# Patient Record
Sex: Female | Born: 1990 | Race: White | Hispanic: No | Marital: Single | Smoking: Current every day smoker
Health system: Southern US, Community
[De-identification: ages and names within clinical notes are randomized; demographics above are authoritative.]

## PROBLEM LIST (undated history)

## (undated) DIAGNOSIS — N189 Chronic kidney disease, unspecified: Secondary | ICD-10-CM

## (undated) DIAGNOSIS — F1190 Opioid use, unspecified, uncomplicated: Secondary | ICD-10-CM

## (undated) DIAGNOSIS — F603 Borderline personality disorder: Secondary | ICD-10-CM

## (undated) DIAGNOSIS — E119 Type 2 diabetes mellitus without complications: Secondary | ICD-10-CM

## (undated) DIAGNOSIS — J45909 Unspecified asthma, uncomplicated: Secondary | ICD-10-CM

## (undated) HISTORY — PX: EYE SURGERY: SHX253

---
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
## 2011-04-22 DIAGNOSIS — J4520 Mild intermittent asthma, uncomplicated: Secondary | ICD-10-CM | POA: Insufficient documentation

## 2011-09-04 DIAGNOSIS — K561 Intussusception: Secondary | ICD-10-CM | POA: Insufficient documentation

## 2011-09-10 DIAGNOSIS — E1143 Type 2 diabetes mellitus with diabetic autonomic (poly)neuropathy: Secondary | ICD-10-CM | POA: Diagnosis present

## 2012-02-03 DIAGNOSIS — G4700 Insomnia, unspecified: Secondary | ICD-10-CM | POA: Insufficient documentation

## 2012-06-02 DIAGNOSIS — E1140 Type 2 diabetes mellitus with diabetic neuropathy, unspecified: Secondary | ICD-10-CM | POA: Diagnosis present

## 2012-08-16 DIAGNOSIS — S2239XA Fracture of one rib, unspecified side, initial encounter for closed fracture: Secondary | ICD-10-CM | POA: Insufficient documentation

## 2012-09-14 DIAGNOSIS — M4120 Other idiopathic scoliosis, site unspecified: Secondary | ICD-10-CM | POA: Insufficient documentation

## 2013-08-24 DIAGNOSIS — F1199 Opioid use, unspecified with unspecified opioid-induced disorder: Secondary | ICD-10-CM | POA: Diagnosis present

## 2016-08-05 DIAGNOSIS — R011 Cardiac murmur, unspecified: Secondary | ICD-10-CM | POA: Insufficient documentation

## 2016-10-28 DIAGNOSIS — Q5121 Other complete doubling of uterus: Secondary | ICD-10-CM | POA: Insufficient documentation

## 2016-10-28 DIAGNOSIS — R3129 Other microscopic hematuria: Secondary | ICD-10-CM | POA: Insufficient documentation

## 2017-04-05 DIAGNOSIS — F1210 Cannabis abuse, uncomplicated: Secondary | ICD-10-CM | POA: Insufficient documentation

## 2018-03-01 DIAGNOSIS — N182 Chronic kidney disease, stage 2 (mild): Secondary | ICD-10-CM | POA: Insufficient documentation

## 2018-03-01 DIAGNOSIS — N189 Chronic kidney disease, unspecified: Secondary | ICD-10-CM | POA: Insufficient documentation

## 2018-03-11 DIAGNOSIS — F909 Attention-deficit hyperactivity disorder, unspecified type: Secondary | ICD-10-CM | POA: Insufficient documentation

## 2018-09-12 DIAGNOSIS — I10 Essential (primary) hypertension: Secondary | ICD-10-CM | POA: Diagnosis present

## 2019-02-19 ENCOUNTER — Other Ambulatory Visit: Payer: Self-pay

## 2019-02-19 ENCOUNTER — Emergency Department
Admission: EM | Admit: 2019-02-19 | Discharge: 2019-02-19 | Disposition: A | Discharge status: Home / Self Care | Payer: Self-pay | Attending: Emergency Medicine | Admitting: Emergency Medicine

## 2019-02-19 DIAGNOSIS — R1110 Vomiting, unspecified: Secondary | ICD-10-CM | POA: Insufficient documentation

## 2019-02-19 DIAGNOSIS — Z5321 Procedure and treatment not carried out due to patient leaving prior to being seen by health care provider: Secondary | ICD-10-CM | POA: Insufficient documentation

## 2019-02-19 HISTORY — DX: Unspecified asthma, uncomplicated: J45.909

## 2019-02-19 HISTORY — DX: Type 2 diabetes mellitus without complications: E11.9

## 2019-02-19 LAB — CBC
HCT: 35.3 % — ABNORMAL LOW (ref 36.0–46.0)
Hemoglobin: 12.3 g/dL (ref 12.0–15.0)
MCH: 28.9 pg (ref 26.0–34.0)
MCHC: 34.8 g/dL (ref 30.0–36.0)
MCV: 82.9 fL (ref 80.0–100.0)
Platelets: 249 10*3/uL (ref 150–400)
RBC: 4.26 MIL/uL (ref 3.87–5.11)
RDW: 11.4 % — ABNORMAL LOW (ref 11.5–15.5)
WBC: 8.6 10*3/uL (ref 4.0–10.5)
nRBC: 0 % (ref 0.0–0.2)

## 2019-02-19 LAB — COMPREHENSIVE METABOLIC PANEL
ALT: 24 U/L (ref 0–44)
AST: 29 U/L (ref 15–41)
Albumin: 3.8 g/dL (ref 3.5–5.0)
Alkaline Phosphatase: 143 U/L — ABNORMAL HIGH (ref 38–126)
Anion gap: 9 (ref 5–15)
BUN: 20 mg/dL (ref 6–20)
CO2: 24 mmol/L (ref 22–32)
Calcium: 9.3 mg/dL (ref 8.9–10.3)
Chloride: 105 mmol/L (ref 98–111)
Creatinine, Ser: 1.11 mg/dL — ABNORMAL HIGH (ref 0.44–1.00)
GFR calc Af Amer: >60 mL/min (ref >60)
GFR calc non Af Amer: >60 mL/min (ref >60)
Glucose, Bld: 219 mg/dL — ABNORMAL HIGH (ref 70–99)
Potassium: 4.7 mmol/L (ref 3.5–5.1)
Sodium: 138 mmol/L (ref 135–145)
Total Bilirubin: 0.6 mg/dL (ref 0.3–1.2)
Total Protein: 7.3 g/dL (ref 6.5–8.1)

## 2019-02-19 LAB — LIPASE, BLOOD: Lipase: 32 U/L (ref 11–51)

## 2019-02-19 MED ORDER — FENTANYL CITRATE (PF) 100 MCG/2ML IJ SOLN
50.0000 ug | INTRAMUSCULAR | Status: AC | PRN
  Administered 2019-02-19 (×2): 50 ug via INTRAVENOUS
  Filled 2019-02-19: qty 2

## 2019-02-19 MED ORDER — ONDANSETRON HCL 4 MG/2ML IJ SOLN
4.0000 mg | Freq: Once | INTRAMUSCULAR | Status: AC
  Administered 2019-02-19: 13:00:00 4 mg via INTRAVENOUS
  Filled 2019-02-19: qty 2

## 2019-02-19 MED ORDER — FENTANYL CITRATE (PF) 100 MCG/2ML IJ SOLN
INTRAMUSCULAR | Status: AC
  Administered 2019-02-19: 14:00:00 50 ug via INTRAVENOUS
  Filled 2019-02-19: qty 2

## 2019-02-19 NOTE — ED Triage Notes (Signed)
Pt states woke up at 10am with generalized abd pain and vomiting. Pt won't stay still. Has type 1 DM. A&O, in wheelchair.

## 2019-02-19 NOTE — ED Notes (Signed)
Pt pacing up and down hallway. Informed to go back to fam wait. Exchanged used emesis bag for new one.

## 2019-02-20 ENCOUNTER — Telehealth: Payer: Self-pay | Admitting: Emergency Medicine

## 2019-02-20 NOTE — Telephone Encounter (Signed)
Called patient due to lwot to inquire about condition and follow up plans. Left message.   

## 2019-05-03 ENCOUNTER — Encounter: Payer: Self-pay | Admitting: Emergency Medicine

## 2019-05-03 ENCOUNTER — Other Ambulatory Visit: Payer: Self-pay

## 2019-05-03 ENCOUNTER — Emergency Department
Admission: EM | Admit: 2019-05-03 | Discharge: 2019-05-03 | Disposition: A | Discharge status: Home / Self Care | Payer: Medicaid Other | Attending: Emergency Medicine | Admitting: Emergency Medicine

## 2019-05-03 DIAGNOSIS — R112 Nausea with vomiting, unspecified: Secondary | ICD-10-CM | POA: Insufficient documentation

## 2019-05-03 DIAGNOSIS — M549 Dorsalgia, unspecified: Secondary | ICD-10-CM | POA: Insufficient documentation

## 2019-05-03 DIAGNOSIS — Z5321 Procedure and treatment not carried out due to patient leaving prior to being seen by health care provider: Secondary | ICD-10-CM | POA: Insufficient documentation

## 2019-05-03 DIAGNOSIS — R109 Unspecified abdominal pain: Secondary | ICD-10-CM | POA: Insufficient documentation

## 2019-05-03 NOTE — ED Notes (Signed)
Pt reported to triage RN that she was going to [facility]

## 2019-05-03 NOTE — ED Notes (Addendum)
Patient reports she has zofran and phenergan at home and she is still nauseas. This RN had two unsuccessful attempts at blood draw. When this RN went to check patients blood sugar she stated "I need to leave and go to [facility]. I normally go there and they know what to do for me." Patient stood up out of wheelchair and walked out to front of ER. Patient reports smoking marijuana last night.

## 2019-05-03 NOTE — ED Triage Notes (Signed)
Pt reports sharp pain to her left flank and back since last pm and some nausea and vomiting. Pt states has kidney disease as well. Pt pale in triage.

## 2019-05-30 ENCOUNTER — Ambulatory Visit: Payer: Medicaid Other | Attending: Internal Medicine

## 2019-05-30 DIAGNOSIS — Z20822 Contact with and (suspected) exposure to covid-19: Secondary | ICD-10-CM

## 2019-05-31 LAB — NOVEL CORONAVIRUS, NAA: SARS-CoV-2, NAA: NOT DETECTED

## 2020-07-12 ENCOUNTER — Emergency Department (HOSPITAL_COMMUNITY): Payer: Self-pay

## 2020-07-12 ENCOUNTER — Inpatient Hospital Stay (HOSPITAL_COMMUNITY)
Admission: EM | Admit: 2020-07-12 | Discharge: 2020-07-16 | DRG: 381 | Disposition: A | Discharge status: Home / Self Care | Payer: Self-pay | Attending: Family Medicine | Admitting: Family Medicine

## 2020-07-12 ENCOUNTER — Other Ambulatory Visit: Payer: Self-pay

## 2020-07-12 ENCOUNTER — Encounter (HOSPITAL_COMMUNITY): Payer: Self-pay | Admitting: Emergency Medicine

## 2020-07-12 DIAGNOSIS — E1140 Type 2 diabetes mellitus with diabetic neuropathy, unspecified: Secondary | ICD-10-CM | POA: Diagnosis present

## 2020-07-12 DIAGNOSIS — F419 Anxiety disorder, unspecified: Secondary | ICD-10-CM | POA: Diagnosis present

## 2020-07-12 DIAGNOSIS — R112 Nausea with vomiting, unspecified: Secondary | ICD-10-CM

## 2020-07-12 DIAGNOSIS — E109 Type 1 diabetes mellitus without complications: Secondary | ICD-10-CM | POA: Diagnosis present

## 2020-07-12 DIAGNOSIS — F909 Attention-deficit hyperactivity disorder, unspecified type: Secondary | ICD-10-CM | POA: Diagnosis present

## 2020-07-12 DIAGNOSIS — E1022 Type 1 diabetes mellitus with diabetic chronic kidney disease: Secondary | ICD-10-CM | POA: Diagnosis present

## 2020-07-12 DIAGNOSIS — N182 Chronic kidney disease, stage 2 (mild): Secondary | ICD-10-CM | POA: Diagnosis present

## 2020-07-12 DIAGNOSIS — K922 Gastrointestinal hemorrhage, unspecified: Secondary | ICD-10-CM | POA: Diagnosis present

## 2020-07-12 DIAGNOSIS — K2211 Ulcer of esophagus with bleeding: Principal | ICD-10-CM | POA: Diagnosis present

## 2020-07-12 DIAGNOSIS — F17210 Nicotine dependence, cigarettes, uncomplicated: Secondary | ICD-10-CM | POA: Diagnosis present

## 2020-07-12 DIAGNOSIS — N141 Nephropathy induced by other drugs, medicaments and biological substances: Secondary | ICD-10-CM | POA: Diagnosis present

## 2020-07-12 DIAGNOSIS — N179 Acute kidney failure, unspecified: Secondary | ICD-10-CM | POA: Diagnosis present

## 2020-07-12 DIAGNOSIS — Z793 Long term (current) use of hormonal contraceptives: Secondary | ICD-10-CM

## 2020-07-12 DIAGNOSIS — R1084 Generalized abdominal pain: Secondary | ICD-10-CM

## 2020-07-12 DIAGNOSIS — I10 Essential (primary) hypertension: Secondary | ICD-10-CM | POA: Diagnosis present

## 2020-07-12 DIAGNOSIS — K209 Esophagitis, unspecified without bleeding: Secondary | ICD-10-CM | POA: Diagnosis present

## 2020-07-12 DIAGNOSIS — E1143 Type 2 diabetes mellitus with diabetic autonomic (poly)neuropathy: Secondary | ICD-10-CM | POA: Diagnosis present

## 2020-07-12 DIAGNOSIS — E1043 Type 1 diabetes mellitus with diabetic autonomic (poly)neuropathy: Secondary | ICD-10-CM | POA: Diagnosis present

## 2020-07-12 DIAGNOSIS — I129 Hypertensive chronic kidney disease with stage 1 through stage 4 chronic kidney disease, or unspecified chronic kidney disease: Secondary | ICD-10-CM | POA: Diagnosis present

## 2020-07-12 DIAGNOSIS — Z681 Body mass index (BMI) 19 or less, adult: Secondary | ICD-10-CM

## 2020-07-12 DIAGNOSIS — Z79899 Other long term (current) drug therapy: Secondary | ICD-10-CM

## 2020-07-12 DIAGNOSIS — F319 Bipolar disorder, unspecified: Secondary | ICD-10-CM | POA: Diagnosis present

## 2020-07-12 DIAGNOSIS — K2970 Gastritis, unspecified, without bleeding: Secondary | ICD-10-CM | POA: Diagnosis present

## 2020-07-12 DIAGNOSIS — F603 Borderline personality disorder: Secondary | ICD-10-CM | POA: Diagnosis present

## 2020-07-12 DIAGNOSIS — R636 Underweight: Secondary | ICD-10-CM | POA: Diagnosis present

## 2020-07-12 DIAGNOSIS — Z888 Allergy status to other drugs, medicaments and biological substances status: Secondary | ICD-10-CM

## 2020-07-12 DIAGNOSIS — K3184 Gastroparesis: Secondary | ICD-10-CM | POA: Diagnosis present

## 2020-07-12 DIAGNOSIS — D631 Anemia in chronic kidney disease: Secondary | ICD-10-CM | POA: Diagnosis present

## 2020-07-12 DIAGNOSIS — T508X5A Adverse effect of diagnostic agents, initial encounter: Secondary | ICD-10-CM | POA: Diagnosis not present

## 2020-07-12 DIAGNOSIS — R451 Restlessness and agitation: Secondary | ICD-10-CM | POA: Diagnosis present

## 2020-07-12 DIAGNOSIS — Z20822 Contact with and (suspected) exposure to covid-19: Secondary | ICD-10-CM | POA: Diagnosis present

## 2020-07-12 DIAGNOSIS — F1120 Opioid dependence, uncomplicated: Secondary | ICD-10-CM | POA: Diagnosis present

## 2020-07-12 DIAGNOSIS — Z791 Long term (current) use of non-steroidal anti-inflammatories (NSAID): Secondary | ICD-10-CM

## 2020-07-12 DIAGNOSIS — N189 Chronic kidney disease, unspecified: Secondary | ICD-10-CM | POA: Diagnosis present

## 2020-07-12 DIAGNOSIS — J45909 Unspecified asthma, uncomplicated: Secondary | ICD-10-CM | POA: Diagnosis present

## 2020-07-12 DIAGNOSIS — Z885 Allergy status to narcotic agent status: Secondary | ICD-10-CM

## 2020-07-12 DIAGNOSIS — E1065 Type 1 diabetes mellitus with hyperglycemia: Secondary | ICD-10-CM | POA: Diagnosis present

## 2020-07-12 DIAGNOSIS — Z794 Long term (current) use of insulin: Secondary | ICD-10-CM

## 2020-07-12 DIAGNOSIS — B3781 Candidal esophagitis: Secondary | ICD-10-CM | POA: Diagnosis present

## 2020-07-12 DIAGNOSIS — F1199 Opioid use, unspecified with unspecified opioid-induced disorder: Secondary | ICD-10-CM | POA: Diagnosis present

## 2020-07-12 DIAGNOSIS — G8929 Other chronic pain: Secondary | ICD-10-CM | POA: Diagnosis present

## 2020-07-12 LAB — CBG MONITORING, ED
Glucose-Capillary: 370 mg/dL — ABNORMAL HIGH (ref 70–99)
Glucose-Capillary: 339 mg/dL — ABNORMAL HIGH (ref 70–99)
Glucose-Capillary: 271 mg/dL — ABNORMAL HIGH (ref 70–99)
Glucose-Capillary: 245 mg/dL — ABNORMAL HIGH (ref 70–99)
Glucose-Capillary: 244 mg/dL — ABNORMAL HIGH (ref 70–99)
Glucose-Capillary: 230 mg/dL — ABNORMAL HIGH (ref 70–99)
Glucose-Capillary: 253 mg/dL — ABNORMAL HIGH (ref 70–99)

## 2020-07-12 LAB — CBC WITH DIFFERENTIAL/PLATELET
Abs Immature Granulocytes: 0.09 10*3/uL — ABNORMAL HIGH (ref 0.00–0.07)
Basophils Absolute: 0.1 10*3/uL (ref 0.0–0.1)
Basophils Relative: 0 %
Eosinophils Absolute: 0 10*3/uL (ref 0.0–0.5)
Eosinophils Relative: 0 %
HCT: 35.4 % — ABNORMAL LOW (ref 36.0–46.0)
Hemoglobin: 12.3 g/dL (ref 12.0–15.0)
Immature Granulocytes: 1 %
Lymphocytes Relative: 7 %
Lymphs Abs: 1 10*3/uL (ref 0.7–4.0)
MCH: 30.3 pg (ref 26.0–34.0)
MCHC: 34.7 g/dL (ref 30.0–36.0)
MCV: 87.2 fL (ref 80.0–100.0)
Monocytes Absolute: 0.2 10*3/uL (ref 0.1–1.0)
Monocytes Relative: 1 %
Neutro Abs: 14.2 10*3/uL — ABNORMAL HIGH (ref 1.7–7.7)
Neutrophils Relative %: 91 %
Platelets: 265 10*3/uL (ref 150–400)
RBC: 4.06 MIL/uL (ref 3.87–5.11)
RDW: 11.7 % (ref 11.5–15.5)
WBC: 15.6 10*3/uL — ABNORMAL HIGH (ref 4.0–10.5)
nRBC: 0 % (ref 0.0–0.2)

## 2020-07-12 LAB — I-STAT BETA HCG BLOOD, ED (MC, WL, AP ONLY): I-stat hCG, quantitative: 7.9 m[IU]/mL — ABNORMAL HIGH (ref <5)

## 2020-07-12 LAB — CBC
HCT: 37.5 % (ref 36.0–46.0)
Hemoglobin: 12.4 g/dL (ref 12.0–15.0)
MCH: 29.6 pg (ref 26.0–34.0)
MCHC: 33.1 g/dL (ref 30.0–36.0)
MCV: 89.5 fL (ref 80.0–100.0)
Platelets: 231 10*3/uL (ref 150–400)
RBC: 4.19 MIL/uL (ref 3.87–5.11)
RDW: 11.9 % (ref 11.5–15.5)
WBC: 17.2 10*3/uL — ABNORMAL HIGH (ref 4.0–10.5)
nRBC: 0 % (ref 0.0–0.2)

## 2020-07-12 LAB — COMPREHENSIVE METABOLIC PANEL
ALT: 20 U/L (ref 0–44)
AST: 20 U/L (ref 15–41)
Albumin: 3.6 g/dL (ref 3.5–5.0)
Alkaline Phosphatase: 153 U/L — ABNORMAL HIGH (ref 38–126)
Anion gap: 14 (ref 5–15)
BUN: 38 mg/dL — ABNORMAL HIGH (ref 6–20)
CO2: 26 mmol/L (ref 22–32)
Calcium: 9.5 mg/dL (ref 8.9–10.3)
Chloride: 94 mmol/L — ABNORMAL LOW (ref 98–111)
Creatinine, Ser: 1.8 mg/dL — ABNORMAL HIGH (ref 0.44–1.00)
GFR, Estimated: 38 mL/min — ABNORMAL LOW (ref >60)
Glucose, Bld: 493 mg/dL — ABNORMAL HIGH (ref 70–99)
Potassium: 3.9 mmol/L (ref 3.5–5.1)
Sodium: 134 mmol/L — ABNORMAL LOW (ref 135–145)
Total Bilirubin: 0.8 mg/dL (ref 0.3–1.2)
Total Protein: 7.3 g/dL (ref 6.5–8.1)

## 2020-07-12 LAB — LIPASE, BLOOD: Lipase: 25 U/L (ref 11–51)

## 2020-07-12 LAB — URINALYSIS, ROUTINE W REFLEX MICROSCOPIC
Bacteria, UA: NONE SEEN
Bilirubin Urine: NEGATIVE
Glucose, UA: >=500 mg/dL — AB
Ketones, ur: 20 mg/dL — AB
Leukocytes,Ua: NEGATIVE
Nitrite: NEGATIVE
Protein, ur: >=300 mg/dL — AB
Specific Gravity, Urine: 1.016 (ref 1.005–1.030)
pH: 7 (ref 5.0–8.0)

## 2020-07-12 LAB — RESP PANEL BY RT-PCR (FLU A&B, COVID) ARPGX2
Influenza A by PCR: NEGATIVE
Influenza B by PCR: NEGATIVE
SARS Coronavirus 2 by RT PCR: NEGATIVE

## 2020-07-12 LAB — RAPID URINE DRUG SCREEN, HOSP PERFORMED
Amphetamines: NOT DETECTED
Barbiturates: NOT DETECTED
Benzodiazepines: NOT DETECTED
Cocaine: NOT DETECTED
Opiates: POSITIVE — AB
Tetrahydrocannabinol: POSITIVE — AB

## 2020-07-12 LAB — POC OCCULT BLOOD, ED: Fecal Occult Bld: POSITIVE — AB

## 2020-07-12 MED ORDER — PANTOPRAZOLE SODIUM 40 MG PO TBEC
40.0000 mg | DELAYED_RELEASE_TABLET | Freq: Every day | ORAL | Status: DC
  Administered 2020-07-13: 40 mg via ORAL
  Filled 2020-07-12: qty 1

## 2020-07-12 MED ORDER — PANTOPRAZOLE SODIUM 40 MG IV SOLR
40.0000 mg | Freq: Once | INTRAVENOUS | Status: AC
  Administered 2020-07-12: 40 mg via INTRAVENOUS
  Filled 2020-07-12: qty 40

## 2020-07-12 MED ORDER — METOCLOPRAMIDE HCL 10 MG PO TABS
10.0000 mg | ORAL_TABLET | Freq: Four times a day (QID) | ORAL | Status: DC
Start: 2020-07-12 — End: 2020-07-16
  Administered 2020-07-12 – 2020-07-16 (×15): 10 mg via ORAL
  Filled 2020-07-12 (×15): qty 1

## 2020-07-12 MED ORDER — ONDANSETRON HCL 4 MG PO TABS
4.0000 mg | ORAL_TABLET | Freq: Four times a day (QID) | ORAL | Status: DC | PRN
Start: 2020-07-12 — End: 2020-07-16
  Filled 2020-07-12: qty 1

## 2020-07-12 MED ORDER — HYDROMORPHONE HCL 1 MG/ML IJ SOLN
1.0000 mg | Freq: Once | INTRAMUSCULAR | Status: AC
Start: 2020-07-12 — End: 2020-07-12
  Administered 2020-07-12: 1 mg via INTRAVENOUS
  Filled 2020-07-12: qty 1

## 2020-07-12 MED ORDER — DEXTROSE 50 % IV SOLN: 0.0000 mL | INTRAVENOUS | Status: DC | PRN

## 2020-07-12 MED ORDER — SERTRALINE HCL 50 MG PO TABS
50.0000 mg | ORAL_TABLET | Freq: Every day | ORAL | Status: DC
  Administered 2020-07-13 – 2020-07-16 (×4): 50 mg via ORAL
  Filled 2020-07-12 (×4): qty 1

## 2020-07-12 MED ORDER — MORPHINE SULFATE (PF) 4 MG/ML IV SOLN
4.0000 mg | Freq: Once | INTRAVENOUS | Status: AC
  Administered 2020-07-12: 4 mg via INTRAVENOUS
  Filled 2020-07-12: qty 1

## 2020-07-12 MED ORDER — HYDRALAZINE HCL 20 MG/ML IJ SOLN: 5.0000 mg | Freq: Once | INTRAMUSCULAR | Status: DC

## 2020-07-12 MED ORDER — OLANZAPINE 5 MG PO TABS
5.0000 mg | ORAL_TABLET | Freq: Every day | ORAL | Status: DC
  Administered 2020-07-13 – 2020-07-16 (×4): 5 mg via ORAL
  Filled 2020-07-12 (×4): qty 1

## 2020-07-12 MED ORDER — DEXTROSE IN LACTATED RINGERS 5 % IV SOLN
INTRAVENOUS | Status: DC
Start: 2020-07-12 — End: 2020-07-13

## 2020-07-12 MED ORDER — ONDANSETRON HCL 4 MG/2ML IJ SOLN
4.0000 mg | Freq: Four times a day (QID) | INTRAMUSCULAR | Status: DC | PRN
  Administered 2020-07-13 – 2020-07-15 (×3): 4 mg via INTRAVENOUS
  Filled 2020-07-12 (×3): qty 2

## 2020-07-12 MED ORDER — POTASSIUM CHLORIDE 10 MEQ/100ML IV SOLN
10.0000 meq | INTRAVENOUS | Status: AC
  Administered 2020-07-12 – 2020-07-13 (×4): 10 meq via INTRAVENOUS
  Filled 2020-07-12 (×4): qty 100

## 2020-07-12 MED ORDER — INSULIN REGULAR(HUMAN) IN NACL 100-0.9 UT/100ML-% IV SOLN
INTRAVENOUS | Status: DC
  Filled 2020-07-12: qty 100

## 2020-07-12 MED ORDER — LACTATED RINGERS IV SOLN: INTRAVENOUS | Status: DC

## 2020-07-12 MED ORDER — DEXTROSE IN LACTATED RINGERS 5 % IV SOLN: INTRAVENOUS | Status: DC

## 2020-07-12 MED ORDER — ONDANSETRON HCL 4 MG/2ML IJ SOLN
4.0000 mg | Freq: Once | INTRAMUSCULAR | Status: AC
  Administered 2020-07-12: 4 mg via INTRAVENOUS
  Filled 2020-07-12: qty 2

## 2020-07-12 MED ORDER — PROCHLORPERAZINE EDISYLATE 10 MG/2ML IJ SOLN
10.0000 mg | Freq: Once | INTRAMUSCULAR | Status: AC
  Administered 2020-07-12: 10 mg via INTRAVENOUS
  Filled 2020-07-12: qty 2

## 2020-07-12 MED ORDER — PROMETHAZINE HCL 12.5 MG PO TABS
25.0000 mg | ORAL_TABLET | ORAL | Status: DC | PRN
  Administered 2020-07-13 – 2020-07-16 (×6): 25 mg via ORAL
  Filled 2020-07-12 (×6): qty 2

## 2020-07-12 MED ORDER — BUSPIRONE HCL 5 MG PO TABS: 10.0000 mg | ORAL_TABLET | Freq: Every day | ORAL | Status: DC | PRN

## 2020-07-12 MED ORDER — HYDROMORPHONE HCL 1 MG/ML IJ SOLN
0.5000 mg | INTRAMUSCULAR | Status: DC | PRN
  Administered 2020-07-13 – 2020-07-16 (×24): 0.5 mg via INTRAVENOUS
  Filled 2020-07-12 (×24): qty 0.5

## 2020-07-12 MED ORDER — AMPHETAMINE-DEXTROAMPHETAMINE 10 MG PO TABS
20.0000 mg | ORAL_TABLET | Freq: Every day | ORAL | Status: DC
  Filled 2020-07-12 (×2): qty 2

## 2020-07-12 MED ORDER — INSULIN REGULAR(HUMAN) IN NACL 100-0.9 UT/100ML-% IV SOLN
INTRAVENOUS | Status: DC
  Administered 2020-07-12: 5 [IU]/h via INTRAVENOUS
  Filled 2020-07-12 (×2): qty 100

## 2020-07-12 MED ORDER — IOHEXOL 300 MG/ML  SOLN
75.0000 mL | Freq: Once | INTRAMUSCULAR | Status: AC | PRN
  Administered 2020-07-12: 75 mL via INTRAVENOUS

## 2020-07-12 MED ORDER — BUPRENORPHINE HCL-NALOXONE HCL 8-2 MG SL SUBL
1.0000 | SUBLINGUAL_TABLET | SUBLINGUAL | Status: DC
Start: 2020-07-12 — End: 2020-07-14
  Administered 2020-07-12: 1 via SUBLINGUAL
  Filled 2020-07-12: qty 1

## 2020-07-12 MED ORDER — SODIUM CHLORIDE 0.9 % IV BOLUS
1000.0000 mL | Freq: Once | INTRAVENOUS | Status: AC
  Administered 2020-07-12: 1000 mL via INTRAVENOUS

## 2020-07-12 NOTE — ED Notes (Signed)
Pt medicated for nausea and pain per admission orders.

## 2020-07-12 NOTE — ED Notes (Signed)
POC hCG blood processing.

## 2020-07-12 NOTE — ED Notes (Signed)
Pt req nausea meds advised RN

## 2020-07-12 NOTE — ED Notes (Signed)
Pt states she frequently gets cramping/spasm/sharp abd pain that she has been seen and tx for multiple times. Does not know what causes it. Was seen at a local hospital yesterday and discharged after pain medication was effective. Back here today as the pain returned. Takes pain medication at home but states it's not effective at times. Up walking around in room. Updated pt on POC to be seen by provider and she expressed understanding.

## 2020-07-12 NOTE — ED Triage Notes (Signed)
Pt c/o of generalized abdominal pain with n/v and black stools x 2 days.

## 2020-07-12 NOTE — H&P (Signed)
History and Physical  Ann Moyer RUE:[id number] DOB: 1990/12/12 DOA: 07/12/2020  Referring physician: Army MeliaLaura Murphy, [facility], EDP PCP[facility]  Outpatient Specialists:   Patient Coming From: home  Chief Complaint: Abdominal pain with melena  HPI: Ann Moyer is a 30 y.o. female with a history of type I diabetes, chronic kidney disease, hypertension, bipolar/depression.  Patient seen for abdominal pain that started in the epigastric and right upper quadrant areas.  This started 2 days ago and is gradually worsened in intensity.  She [facility].  They gave her IV fluids, Haldol, then discharged from the emergency department.  Her pain is continued to worsen.  She has been taking ibuprofen pretty regularly over the past 3 to 4 weeks due to back pain following an injury.  No palliating or provoking factors.  She has had black stools.  Denies diarrhea, fevers, chills.  Emergency Department Course: The hyperglycemic with a blood sugar in the 490s.  White count elevated to 15.6.  UA appears fairly normal other than mild ketones and glucose.  Patient was started on Endotool with gradual improvement of her blood sugars.  Review of Systems:   Pt denies any fevers, chills, diarrhea, constipation, shortness of breath, dyspnea on exertion, orthopnea, cough, wheezing, palpitations, headache, vision changes, lightheadedness, dizziness, rectal bleeding.  Review of systems are otherwise negative  Past Medical History:  Diagnosis Date  . Asthma   . Diabetes mellitus without complication [facility](HCC)    Past Surgical History:  Procedure Laterality Date  . EYE SURGERY     Social History:  reports that she has been smoking. She has been smoking about 0.50 packs per day. She has never used smokeless tobacco. She reports previous alcohol use. She reports current drug use. Drug: Marijuana. Patient lives at home  Allergies  Allergen Reactions   . Amitriptyline Swelling  . Gabapentin     swelling  . Haldol [Haloperidol]     Uncontrollable movements   . Lyrica [Pregabalin]     swelling  . Toradol [Ketorolac Tromethamine] Nausea Only  . Tramadol     Swelling, itching    History reviewed. No pertinent family history.    Prior to Admission medications   Medication Sig Start Date End Date Taking? Authorizing Provider  acetaminophen (TYLENOL) 325 MG tablet Take 650 mg by mouth every 6 (six) hours as needed. 05/28/14  Yes [provider]  albuterol (VENTOLIN HFA) 108 (90 Base) MCG/ACT inhaler Inhale 2 puffs into the lungs every 4 (four) hours as needed for wheezing or shortness of breath. 01/26/13  Yes [provider]  amphetamine-dextroamphetamine (ADDERALL) 20 MG tablet Take 20 mg by mouth daily. 06/10/20  Yes [provider]  buprenorphine-naloxone (SUBOXONE) 8-2 mg SUBL SL tablet Place 1 tablet under the tongue See admin instructions. Dissolve i1/2 tablet by mouth 3 times a day 07/10/20  Yes [provider]  busPIRone (BUSPAR) 10 MG tablet Take 10 mg by mouth daily as needed (anxiety).   Yes [provider]  etonogestrel (NEXPLANON) 68 MG IMPL implant 68 mg by Subdermal route once. 04/03/20  Yes [provider]  glucagon 1 MG injection Inject 1 mg into the vein once as needed. 08/12/10  Yes [provider]  insulin aspart (NOVOLOG) 100 UNIT/ML FlexPen Inject 2-5 Units into the skin 2 (two) times daily before a meal. Sliding scale 04/03/20  Yes [provider]  insulin glargine (LANTUS) 100 UNIT/ML Solostar Pen Inject  16 Units into the skin at bedtime. 02/08/20  Yes [provider]  Melatonin 10 MG TABS Take 1 tablet by mouth daily.   Yes [provider]  OLANZapine (ZYPREXA) 5 MG tablet Take 5 mg by mouth daily. 07/10/20 08/09/20 Yes [provider]  omeprazole (PRILOSEC) 20 MG capsule Take 20 mg by mouth daily.   Yes [provider]   ondansetron (ZOFRAN-ODT) 4 MG disintegrating tablet Take 4 mg by mouth every 8 (eight) hours as needed for nausea or vomiting. 07/10/20  Yes [provider]  promethazine (PHENERGAN) 25 MG tablet Take 25 mg by mouth every 4 (four) hours as needed. 07/10/20  Yes [provider]  sertraline (ZOLOFT) 50 MG tablet Take 1 tablet by mouth daily. 07/28/19 07/27/20 Yes [provider]  SUMAtriptan Succinate Refill 6 MG/0.5ML SOCT Inject into the skin. 09/27/19 09/26/20 Yes [provider]  tranexamic acid (LYSTEDA) 650 MG TABS tablet Take 1,300 mg by mouth 3 (three) times daily. 02/01/20  Yes [provider]    Physical Exam: BP (!) 144/86   Pulse 90   Temp 98.5 F (36.9 C) (Oral)   Resp 12   Ht 5\' 5"  (1.651 m)   Wt 49.9 kg   SpO2 100%   BMI 18.30 kg/m   . General: Young female. Awake and alert and oriented x3. No acute cardiopulmonary distress.  HEENT: Normocephalic atraumatic.  Right and left ears normal in appearance.  Pupils equal, round, reactive to light. Extraocular muscles are intact. Sclerae anicteric and noninjected.  Moist mucosal membranes. No mucosal lesions.  . Neck: Neck supple without lymphadenopathy. No carotid bruits. No masses palpated.  . Cardiovascular: Regular rate with normal S1-S2 sounds. No murmurs, rubs, gallops auscultated. No JVD.  Marland Kitchen Respiratory: Good respiratory effort with no wheezes, rales, rhonchi. Lungs clear to auscultation bilaterally.  No accessory muscle use. . Abdomen: Pain in the right upper quadrant and epigastric area.  No rebound tenderness or guarding. nondistended.  Diminished bowel sounds. No masses or hepatosplenomegaly  . Skin: No rashes, lesions, or ulcerations.  Dry, warm to touch. 2+ dorsalis pedis and radial pulses. . Musculoskeletal: No calf or leg pain. All major joints not erythematous nontender.  No upper or lower joint deformation.  Good ROM.  No contractures  . Psychiatric: Intact judgment and  insight. Pleasant and cooperative. . Neurologic: No focal neurological deficits. Strength is 5/5 and symmetric in upper and lower extremities.  Cranial nerves II through XII are grossly intact.           Labs on Admission: I have personally reviewed following labs and imaging studies  CBC: Recent Labs  Lab 07/12/20 1124  WBC 15.6*  NEUTROABS 14.2*  HGB 12.3  HCT 35.4*  MCV 87.2  PLT 265   Basic Metabolic Panel: Recent Labs  Lab 07/12/20 1124  NA 134*  K 3.9  CL 94*  CO2 26  GLUCOSE 493*  BUN 38*  CREATININE 1.80*  CALCIUM 9.5   GFR: Estimated Creatinine Clearance: 36 mL/min (A) (by C-G formula based on SCr of 1.8 mg/dL (H)). Liver Function Tests: Recent Labs  Lab 07/12/20 1124  AST 20  ALT 20  ALKPHOS 153*  BILITOT 0.8  PROT 7.3  ALBUMIN 3.6   Recent Labs  Lab 07/12/20 1124  LIPASE 25   No results for input(s): AMMONIA in the last 168 hours. Coagulation Profile: No results for input(s): INR, PROTIME in the last 168 hours. Cardiac Enzymes: No results for input(s):  CKTOTAL, CKMB, CKMBINDEX, TROPONINI in the last 168 hours. BNP (last 3 results) No results for input(s): PROBNP in the last 8760 hours. HbA1C: No results for input(s): HGBA1C in the last 72 hours. CBG: Recent Labs  Lab 07/12/20 1538 07/12/20 1644 07/12/20 1757 07/12/20 1903 07/12/20 2020  GLUCAP 339* 271* 245* 244* 230*   Lipid Profile: No results for input(s): CHOL, HDL, LDLCALC, TRIG, CHOLHDL, LDLDIRECT in the last 72 hours. Thyroid Function Tests: No results for input(s): TSH, T4TOTAL, FREET4, T3FREE, THYROIDAB in the last 72 hours. Anemia Panel: No results for input(s): VITAMINB12, FOLATE, FERRITIN, TIBC, IRON, RETICCTPCT in the last 72 hours. Urine analysis:    Component Value Date/Time   COLORURINE STRAW (A) 07/12/2020 1425   APPEARANCEUR CLEAR 07/12/2020 1425   LABSPEC 1.016 07/12/2020 1425   PHURINE 7.0 07/12/2020 1425   GLUCOSEU >=500 (A) 07/12/2020 1425   HGBUR  SMALL (A) 07/12/2020 1425   BILIRUBINUR NEGATIVE 07/12/2020 1425   KETONESUR 20 (A) 07/12/2020 1425   PROTEINUR >=300 (A) 07/12/2020 1425   NITRITE NEGATIVE 07/12/2020 1425   LEUKOCYTESUR NEGATIVE 07/12/2020 1425   Sepsis Labs: @LABRCNTIP (procalcitonin:4,lacticidven:4) ) Recent Results (from the past 240 hour(s))  Resp Panel by RT-PCR (Flu A&B, Covid) Nasopharyngeal Swab     Status: None   Collection Time: 07/12/20  4:19 PM   Specimen: Nasopharyngeal Swab; Nasopharyngeal(NP) swabs in vial transport medium  Result Value Ref Range Status   SARS Coronavirus 2 by RT PCR NEGATIVE NEGATIVE Final    Comment: (NOTE) SARS-CoV-2 target nucleic acids are NOT DETECTED.  The SARS-CoV-2 RNA is generally detectable in upper respiratory specimens during the acute phase of infection. The lowest concentration of SARS-CoV-2 viral copies this assay can detect is 138 copies/mL. A negative result does not preclude SARS-Cov-2 infection and should not be used as the sole basis for treatment or other patient management decisions. A negative result may occur with  improper specimen collection/handling, submission of specimen other than nasopharyngeal swab, presence of viral mutation(s) within the areas targeted by this assay, and inadequate number of viral copies(<138 copies/mL). A negative result must be combined with clinical observations, patient history, and epidemiological information. The expected result is Negative.  Fact Sheet for Patients:  09/11/20  Fact Sheet for Healthcare Providers:  BloggerCourse.com  This test is no t yet approved or cleared by the [employer] FDA and  has been authorized for detection and/or diagnosis of SARS-CoV-2 by FDA under an Emergency Use Authorization (EUA). This EUA will remain  in effect (meaning this test can be used) for the duration of the COVID-19 declaration under Section 564(b)(1) of the Act,  21 U.S.C.section 360bbb-3(b)(1), unless the authorization is terminated  or revoked sooner.       Influenza A by PCR NEGATIVE NEGATIVE Final   Influenza B by PCR NEGATIVE NEGATIVE Final    Comment: (NOTE) The Xpert Xpress SARS-CoV-2/FLU/RSV plus assay is intended as an aid in the diagnosis of influenza from Nasopharyngeal swab specimens and should not be used as a sole basis for treatment. Nasal washings and aspirates are unacceptable for Xpert Xpress SARS-CoV-2/FLU/RSV testing.  Fact Sheet for Patients: [place]  Fact Sheet for Healthcare Providers: BloggerCourse.com  This test is not yet approved or cleared by the [employer].[place] FDA and has been authorized for detection and/or diagnosis of SARS-CoV-2 by FDA under an Emergency Use Authorization (EUA). This EUA will remain in effect (meaning this test can be used) for the duration of the COVID-19 declaration under Section 564(b)(1) of  the Act, 21 U.S.C. section 360bbb-3(b)(1), unless the authorization is terminated or revoked.  Performed at [facility], [address], [place], [place] [address]      Radiological Exams on Admission: CT Abdomen Pelvis W Contrast  Result Date: 07/12/2020 CLINICAL DATA:  Acute generalized abdominal pain. EXAM: CT ABDOMEN AND PELVIS WITH CONTRAST TECHNIQUE: Multidetector CT imaging of the abdomen and pelvis was performed using the standard protocol following bolus administration of intravenous contrast. CONTRAST:  6mL OMNIPAQUE IOHEXOL 300 MG/ML  SOLN COMPARISON:  None. FINDINGS: Lower chest: No acute abnormality. Hepatobiliary: No focal liver abnormality is seen. No gallstones, gallbladder wall thickening, or biliary dilatation. Pancreas: Unremarkable. No pancreatic ductal dilatation or surrounding inflammatory changes. Spleen: Normal in size without focal abnormality. Adrenals/Urinary Tract: Adrenal glands are unremarkable. Kidneys are normal,  without renal calculi, focal lesion, or hydronephrosis. Bladder is unremarkable. Stomach/Bowel: The stomach and appendix appear normal. There is no evidence of bowel obstruction. There is noted diffuse wall and fold thickening involving the colon which may simply represent lack of distension, but infectious or inflammatory colitis cannot be excluded. Vascular/Lymphatic: No significant vascular findings are present. No enlarged abdominal or pelvic lymph nodes. Reproductive: Uterus and bilateral adnexa are unremarkable. Other: No abdominal wall hernia or abnormality. No abdominopelvic ascites. Musculoskeletal: No acute or significant osseous findings. IMPRESSION: Diffuse wall and fold thickening is noted throughout the colon which may simply represent lack of distension, but infectious or inflammatory colitis cannot be excluded. Electronically Signed   By: Lupita Raider M.D.   On: 07/12/2020 15:25    Assessment/Plan: Active Problems:   Upper GI bleed   CKD (chronic kidney disease), stage II   Diabetic gastroparesis (HCC)   Diabetic neuropathy (HCC)   Hypertension   Opioid use with opioid-induced disorder (HCC)   Type 1 diabetes mellitus not at goal [facility])    This patient was discussed with the Ann physician, including pertinent vitals, physical exam findings, labs, and imaging.  We also discussed care given by the Ann provider.  1. Abdominal pain with upper GI bleed a. N.p.o. with sips b. Protonix c. GI to consult d. Serial CBC e. Minimal use of pain medicine as there is a concern for aspiration due to recent and consistent NSAID use, will use opioids sparingly f. There may be a large part of this that is related to her gastroparesis.  We will maximize treatment for gastroparesis Reglan and watch for improvement. 2. Diabetic gastroparesis a. Start Reglan b. Continue with Zofran Phenergan as needed 3. Poorly controlled diabetes with hyperglycemia a. Patient currently on an insulin  drip 4. Opioid use disorder a. On Suboxone b. Being on Suboxone does not preclude the use of opioid analgesics 5. Hypertension a. Continue antihypertensives  DVT prophylaxis: SCDs Consultants: GI Code Status: Full code  Family Communication: None Disposition Plan: Pending   Levie Heritage, DO

## 2020-07-12 NOTE — ED Notes (Signed)
Pt currently asleep, RR even and nonlabored. No s/s of distress noted. IVF and insulin continue infusing per orders. Bed low and locked, call bell in reach. Will continue to monitor.

## 2020-07-12 NOTE — ED Notes (Addendum)
Pt aware of need for urine specimen, states she cannot provide one yet but will try once the fluids have completed. Reports pain mildly relieved by medication, asking for an additional dose. Relayed pt's requested to PA.

## 2020-07-12 NOTE — ED Provider Notes (Signed)
[facility] EMERGENCY DEPARTMENT Provider Note   CSN: [id number] Arrival date & time: 07/12/20  1023     History Chief Complaint  Patient presents with  . Abdominal Pain    Malli Falotico is a 30 y.o. female.  30 year old female history of diabetes and asthma, presents with complaint of abdominal pain with nausea and vomiting and black stools for 2 days.  Patient reports sudden onset of pain 2 days ago, was seen at [facility] emergency room, given IV fluids and Haldol and states that she fell asleep and when she woke up she was discharged.  Denies fevers or sick contacts, occasionally takes NSAIDs however does not report daily or regular use, no history of GERD or peptic ulcer disease.  Denies shortness of breath, chest pain, feeling weak or dizzy.  No other complaints or concerns today.        Past Medical History:  Diagnosis Date  . Asthma   . Diabetes mellitus without complication [facility])     Patient Active Problem List   Diagnosis Date Noted  . Upper GI bleed 07/12/2020    Past Surgical History:  Procedure Laterality Date  . EYE SURGERY       OB History   No obstetric history on file.     History reviewed. No pertinent family history.  Social History   Tobacco Use  . Smoking status: Current Every Day Smoker    Packs/day: 0.50  . Smokeless tobacco: Never Used  Substance Use Topics  . Alcohol use: Not Currently  . Drug use: Yes    Types: Marijuana    Home Medications Prior to Admission medications   Medication Sig Start Date End Date Taking? Authorizing Provider  acetaminophen (TYLENOL) 325 MG tablet Take 650 mg by mouth every 6 (six) hours as needed. 05/28/14  Yes [provider]  albuterol (VENTOLIN HFA) 108 (90 Base) MCG/ACT inhaler Inhale 2 puffs into the lungs every 4 (four) hours as needed for wheezing or shortness of breath. 01/26/13  Yes [provider]  amphetamine-dextroamphetamine (ADDERALL) 20 MG tablet Take 20 mg by mouth  daily. 06/10/20  Yes [provider]  buprenorphine-naloxone (SUBOXONE) 8-2 mg SUBL SL tablet Place 1 tablet under the tongue See admin instructions. Dissolve i1/2 tablet by mouth 3 times a day 07/10/20  Yes [provider]  busPIRone (BUSPAR) 10 MG tablet Take 10 mg by mouth daily as needed (anxiety).   Yes [provider]  etonogestrel (NEXPLANON) 68 MG IMPL implant 68 mg by Subdermal route once. 04/03/20  Yes [provider]  glucagon 1 MG injection Inject 1 mg into the vein once as needed. 08/12/10  Yes [provider]  insulin aspart (NOVOLOG) 100 UNIT/ML FlexPen Inject 2-5 Units into the skin 2 (two) times daily before a meal. Sliding scale 04/03/20  Yes [provider]  insulin glargine (LANTUS) 100 UNIT/ML Solostar Pen Inject 16 Units into the skin at bedtime. 02/08/20  Yes [provider]  Melatonin 10 MG TABS Take 1 tablet by mouth daily.   Yes [provider]  OLANZapine (ZYPREXA) 5 MG tablet Take 5 mg by mouth daily. 07/10/20 08/09/20 Yes [provider]  omeprazole (PRILOSEC) 20 MG capsule Take 20 mg by mouth daily.   Yes [provider]  ondansetron (ZOFRAN-ODT) 4 MG disintegrating tablet Take 4 mg by mouth every 8 (eight) hours as needed for nausea or vomiting. 07/10/20  Yes [provider]  promethazine (PHENERGAN) 25 MG tablet Take 25 mg  by mouth every 4 (four) hours as needed. 07/10/20  Yes [provider]  sertraline (ZOLOFT) 50 MG tablet Take 1 tablet by mouth daily. 07/28/19 07/27/20 Yes [provider]  SUMAtriptan Succinate Refill 6 MG/0.5ML SOCT Inject into the skin. 09/27/19 09/26/20 Yes [provider]  tranexamic acid (LYSTEDA) 650 MG TABS tablet Take 1,300 mg by mouth 3 (three) times daily. 02/01/20  Yes [provider]    Allergies    Amitriptyline, Gabapentin, Haldol [haloperidol], Lyrica [pregabalin], Toradol [ketorolac tromethamine], and  Tramadol  Review of Systems   Review of Systems  Constitutional: Negative for chills and fever.  Respiratory: Negative for shortness of breath.   Cardiovascular: Negative for chest pain.  Gastrointestinal: Positive for abdominal pain, blood in stool, diarrhea, nausea and vomiting. Negative for constipation.  Genitourinary: Negative for difficulty urinating and dysuria.  Musculoskeletal: Negative for arthralgias, back pain and myalgias.  Skin: Negative for rash and wound.  Allergic/Immunologic: Negative for immunocompromised state.  Neurological: Negative for dizziness, weakness and light-headedness.  All other systems reviewed and are negative.   Physical Exam Updated Vital Signs BP 132/86 (BP Location: Right Arm)   Pulse 90   Temp 98.5 F (36.9 C) (Oral)   Resp 16   Ht 5\' 5"  (1.651 m)   Wt 49.9 kg   SpO2 100%   BMI 18.30 kg/m   Physical Exam Vitals and nursing note reviewed. Exam conducted with a chaperone present.  Constitutional:      General: She is not in acute distress.    Appearance: She is well-developed. She is not diaphoretic.  HENT:     Head: Normocephalic and atraumatic.  Cardiovascular:     Rate and Rhythm: Normal rate and regular rhythm.     Heart sounds: Normal heart sounds.  Pulmonary:     Effort: Pulmonary effort is normal.     Breath sounds: Normal breath sounds.  Abdominal:     Palpations: Abdomen is soft.     Tenderness: There is generalized abdominal tenderness.  Genitourinary:    Rectum: Guaiac result positive.     Comments: Stool black Skin:    General: Skin is warm and dry.     Findings: No erythema or rash.  Neurological:     Mental Status: She is alert and oriented to person, place, and time.  Psychiatric:        Mood and Affect: Mood is anxious.        Behavior: Behavior is agitated.     ED Results / Procedures / Treatments   Labs (all labs ordered are listed, but only abnormal results are displayed) Labs Reviewed  CBC WITH  DIFFERENTIAL/PLATELET - Abnormal; Notable for the following components:      Result Value   WBC 15.6 (*)    HCT 35.4 (*)    Neutro Abs 14.2 (*)    Abs Immature Granulocytes 0.09 (*)    All other components within normal limits  COMPREHENSIVE METABOLIC PANEL - Abnormal; Notable for the following components:   Sodium 134 (*)    Chloride 94 (*)    Glucose, Bld 493 (*)    BUN 38 (*)    Creatinine, Ser 1.80 (*)    Alkaline Phosphatase 153 (*)    GFR, Estimated 38 (*)    All other components within normal limits  URINALYSIS, ROUTINE W REFLEX MICROSCOPIC - Abnormal; Notable for the following components:   Color, Urine STRAW (*)    Glucose, UA >=500 (*)    Hgb  urine dipstick SMALL (*)    Ketones, ur 20 (*)    Protein, ur >=300 (*)    All other components within normal limits  RAPID URINE DRUG SCREEN, HOSP PERFORMED - Abnormal; Notable for the following components:   Opiates POSITIVE (*)    Tetrahydrocannabinol POSITIVE (*)    All other components within normal limits  I-STAT BETA HCG BLOOD, ED ([facility], [facility], [facility] ONLY) - Abnormal; Notable for the following components:   I-stat hCG, quantitative 7.9 (*)    All other components within normal limits  POC OCCULT BLOOD, ED - Abnormal; Notable for the following components:   Fecal Occult Bld POSITIVE (*)    All other components within normal limits  CBG MONITORING, ED - Abnormal; Notable for the following components:   Glucose-Capillary 370 (*)    All other components within normal limits  CBG MONITORING, ED - Abnormal; Notable for the following components:   Glucose-Capillary 339 (*)    All other components within normal limits  CBG MONITORING, ED - Abnormal; Notable for the following components:   Glucose-Capillary 271 (*)    All other components within normal limits  CBG MONITORING, ED - Abnormal; Notable for the following components:   Glucose-Capillary 245 (*)    All other components within normal limits  RESP PANEL BY RT-PCR (FLU A&B,  COVID) ARPGX2  LIPASE, BLOOD  POC URINE PREG, ED    EKG None  Radiology CT Abdomen Pelvis W Contrast  Result Date: 07/12/2020 CLINICAL DATA:  Acute generalized abdominal pain. EXAM: CT ABDOMEN AND PELVIS WITH CONTRAST TECHNIQUE: Multidetector CT imaging of the abdomen and pelvis was performed using the standard protocol following bolus administration of intravenous contrast. CONTRAST:  63mL OMNIPAQUE IOHEXOL 300 MG/ML  SOLN COMPARISON:  None. FINDINGS: Lower chest: No acute abnormality. Hepatobiliary: No focal liver abnormality is seen. No gallstones, gallbladder wall thickening, or biliary dilatation. Pancreas: Unremarkable. No pancreatic ductal dilatation or surrounding inflammatory changes. Spleen: Normal in size without focal abnormality. Adrenals/Urinary Tract: Adrenal glands are unremarkable. Kidneys are normal, without renal calculi, focal lesion, or hydronephrosis. Bladder is unremarkable. Stomach/Bowel: The stomach and appendix appear normal. There is no evidence of bowel obstruction. There is noted diffuse wall and fold thickening involving the colon which may simply represent lack of distension, but infectious or inflammatory colitis cannot be excluded. Vascular/Lymphatic: No significant vascular findings are present. No enlarged abdominal or pelvic lymph nodes. Reproductive: Uterus and bilateral adnexa are unremarkable. Other: No abdominal wall hernia or abnormality. No abdominopelvic ascites. Musculoskeletal: No acute or significant osseous findings. IMPRESSION: Diffuse wall and fold thickening is noted throughout the colon which may simply represent lack of distension, but infectious or inflammatory colitis cannot be excluded. Electronically Signed   By: Lupita Raider M.D.   On: 07/12/2020 15:25    Procedures .Critical Care Performed by: Jeannie Fend, PA-C Authorized by: Jeannie Fend, PA-C   Critical care provider statement:    Critical care time (minutes):  45   Critical  care was time spent personally by me on the following activities:  Discussions with consultants, evaluation of patient's response to treatment, examination of patient, ordering and performing treatments and interventions, ordering and review of laboratory studies, ordering and review of radiographic studies, pulse oximetry, re-evaluation of patient's condition, obtaining history from patient or surrogate and review of old charts     Medications Ordered in ED Medications  insulin regular, human (MYXREDLIN) 100 units/ 100 mL infusion (5.5 Units/hr Intravenous Rate/Dose Change 07/12/20  1615)  lactated ringers infusion ( Intravenous Stopped 07/12/20 1808)  dextrose 5 % in lactated ringers infusion ( Intravenous New Bag/Given 07/12/20 1800)  dextrose 50 % solution 0-50 mL (has no administration in time range)  hydrALAZINE (APRESOLINE) injection 5 mg (5 mg Intravenous Not Given 07/12/20 1810)  ondansetron (ZOFRAN) injection 4 mg (4 mg Intravenous Given 07/12/20 1156)  sodium chloride 0.9 % bolus 1,000 mL (0 mLs Intravenous Stopped 07/12/20 1300)  morphine 4 MG/ML injection 4 mg (4 mg Intravenous Given 07/12/20 1159)  morphine 4 MG/ML injection 4 mg (4 mg Intravenous Given 07/12/20 1436)  iohexol (OMNIPAQUE) 300 MG/ML solution 75 mL (75 mLs Intravenous Contrast Given 07/12/20 1446)  prochlorperazine (COMPAZINE) injection 10 mg (10 mg Intravenous Given 07/12/20 1601)  HYDROmorphone (DILAUDID) injection 1 mg (1 mg Intravenous Given 07/12/20 1605)  pantoprazole (PROTONIX) injection 40 mg (40 mg Intravenous Given 07/12/20 1610)    ED Course  I have reviewed the triage vital signs and the nursing notes.  Pertinent labs & imaging results that were available during my care of the patient were reviewed by me and considered in my medical decision making (see chart for details).  Clinical Course as of 07/12/20 1825  Fri Jul 12, 2020  2997 30 year old female with history of diabetes and asthma presents with nausea and vomiting  with abdominal pain x2 days progressively worsening.  Also reports her stools are black and sticky. On exam, patient is anxious and agitated, found to have mild generalized abdominal tenderness without guarding or rebound tenderness. Patient was given IV fluids, Zofran, morphine.  She reports some relief from the morphine however pain is returning and a repeat dose has been ordered. Patient declines rectal exam at this time, states she will do it but not right now. CBC with normal hemoglobin, her white blood cell count is elevated at 15.6 with increased neutrophils. CMP with glucose of 493, bicarb and anion gap are normal, doubt DKA.  Creatinine is elevated at 1.8 with a GFR of 38. Endo tool was ordered for further management of patient's hydration and glucose. Patient's hCG is 7.9.  Discussed this with the patient who is adamant that she is not pregnant.  Discussed the concerns for radiation exposure to potentially very early developing fetus, patient verbalizes understanding and states that she will sign the paper if needed however she is adamant she is not pregnant. [LM]  1418 Lipase within normal limits.  Pending CT abdomen pelvis.  Patient has refused oral contrast. [LM]  1418 Emesis bag at bedside with purple contents, states she has been drinking grape Pedialyte. [LM]  1544 CT with nonspecific finding of possible colitis.  Hemoccult is positive with black stools. Patient reports ongoing pain and vomiting, requesting Compazine which has been ordered.  Blood sugars gradually improving with insulin drip and fluids, currently 339. [LM]  1555 Patient with intractable nausea, vomiting, abdominal pain.  Stool is melanotic and Hemoccult positive.  Hemoglobin within normal limits at 12.3. As finally able to access patient's medical record from her [facility] ED visit from yesterday.  Patient's creatinine at that time was 1.1, now 1.8.  BUN previously 18, now 38.  Patient was given IV fluids and  transitioned to insulin drip with further fluid management with lactated Ringer's.  Event Protonix for concern for GI bleed.  Plan is to admit for intractable pain, vomiting.  Hospitalist paged for consult. [LM]  1738 Discussed with Dr. Adrian Blackwater with [facility] hospitalist who will consult for admission, request consult to GI  for melanotic stools. [LM]  1824 Case discussed with Dr. Marletta Lorarver with GI who will see the patient tomorrow unless needed overnight. [LM]    Clinical Course User Index [LM] Alden HippMurphy, Faye Strohman A, PA-C   MDM Rules/Calculators/A&P                          Final Clinical Impression(s) / ED Diagnoses Final diagnoses:  Generalized abdominal pain  Intractable vomiting with nausea, unspecified vomiting type  Gastrointestinal hemorrhage, unspecified gastrointestinal hemorrhage type    Rx / DC Orders ED Discharge Orders    None       Jeannie FendMurphy, Willer Osorno A, PA-C 07/12/20 1825    Vanetta MuldersZackowski, Scott, MD 07/16/20 0011

## 2020-07-12 NOTE — ED Notes (Signed)
Pt out of room to CT 

## 2020-07-12 NOTE — ED Notes (Signed)
Updated pt on admission floor. No additional needs voiced at this time. Bed low and locked, call bell in reach. Resting on stretcher with IVF and insulin infusing without complications.

## 2020-07-12 NOTE — ED Notes (Signed)
Pt sitting up at side of bed, more comfortable sitting up than laying on stretcher. RR even and nonlabored. Call bell in reach. No additional questions at this time.

## 2020-07-13 ENCOUNTER — Inpatient Hospital Stay (HOSPITAL_COMMUNITY): Payer: Self-pay | Admitting: Anesthesiology

## 2020-07-13 ENCOUNTER — Encounter (HOSPITAL_COMMUNITY)
Admission: EM | Disposition: A | Discharge status: Home / Self Care | Payer: Self-pay | Source: Home / Self Care | Attending: Family Medicine

## 2020-07-13 ENCOUNTER — Encounter (HOSPITAL_COMMUNITY): Payer: Self-pay | Admitting: Internal Medicine

## 2020-07-13 DIAGNOSIS — R1084 Generalized abdominal pain: Secondary | ICD-10-CM

## 2020-07-13 DIAGNOSIS — R112 Nausea with vomiting, unspecified: Secondary | ICD-10-CM

## 2020-07-13 DIAGNOSIS — N189 Chronic kidney disease, unspecified: Secondary | ICD-10-CM | POA: Diagnosis present

## 2020-07-13 DIAGNOSIS — N179 Acute kidney failure, unspecified: Secondary | ICD-10-CM | POA: Diagnosis present

## 2020-07-13 DIAGNOSIS — K922 Gastrointestinal hemorrhage, unspecified: Secondary | ICD-10-CM

## 2020-07-13 HISTORY — PX: BIOPSY: SHX5522

## 2020-07-13 HISTORY — PX: ESOPHAGOGASTRODUODENOSCOPY (EGD) WITH PROPOFOL: SHX5813

## 2020-07-13 LAB — GLUCOSE, CAPILLARY
Glucose-Capillary: 149 mg/dL — ABNORMAL HIGH (ref 70–99)
Glucose-Capillary: 173 mg/dL — ABNORMAL HIGH (ref 70–99)
Glucose-Capillary: 187 mg/dL — ABNORMAL HIGH (ref 70–99)
Glucose-Capillary: 187 mg/dL — ABNORMAL HIGH (ref 70–99)
Glucose-Capillary: 250 mg/dL — ABNORMAL HIGH (ref 70–99)
Glucose-Capillary: 273 mg/dL — ABNORMAL HIGH (ref 70–99)

## 2020-07-13 LAB — CBC
HCT: 38.1 % (ref 36.0–46.0)
Hemoglobin: 12.1 g/dL (ref 12.0–15.0)
MCH: 29.4 pg (ref 26.0–34.0)
MCHC: 31.8 g/dL (ref 30.0–36.0)
MCV: 92.7 fL (ref 80.0–100.0)
Platelets: 216 10*3/uL (ref 150–400)
RBC: 4.11 MIL/uL (ref 3.87–5.11)
RDW: 11.7 % (ref 11.5–15.5)
WBC: 16.9 10*3/uL — ABNORMAL HIGH (ref 4.0–10.5)
nRBC: 0 % (ref 0.0–0.2)

## 2020-07-13 LAB — CBG MONITORING, ED
Glucose-Capillary: 193 mg/dL — ABNORMAL HIGH (ref 70–99)
Glucose-Capillary: 262 mg/dL — ABNORMAL HIGH (ref 70–99)

## 2020-07-13 LAB — HIV ANTIBODY (ROUTINE TESTING W REFLEX): HIV Screen 4th Generation wRfx: NONREACTIVE

## 2020-07-13 LAB — BASIC METABOLIC PANEL
Anion gap: 11 (ref 5–15)
Anion gap: 10 (ref 5–15)
BUN: 32 mg/dL — ABNORMAL HIGH (ref 6–20)
BUN: 34 mg/dL — ABNORMAL HIGH (ref 6–20)
CO2: 25 mmol/L (ref 22–32)
CO2: 23 mmol/L (ref 22–32)
Calcium: 9.4 mg/dL (ref 8.9–10.3)
Calcium: 8.9 mg/dL (ref 8.9–10.3)
Chloride: 103 mmol/L (ref 98–111)
Chloride: 104 mmol/L (ref 98–111)
Creatinine, Ser: 1.53 mg/dL — ABNORMAL HIGH (ref 0.44–1.00)
Creatinine, Ser: 1.75 mg/dL — ABNORMAL HIGH (ref 0.44–1.00)
GFR, Estimated: 47 mL/min — ABNORMAL LOW (ref >60)
GFR, Estimated: 40 mL/min — ABNORMAL LOW (ref >60)
Glucose, Bld: 235 mg/dL — ABNORMAL HIGH (ref 70–99)
Glucose, Bld: 301 mg/dL — ABNORMAL HIGH (ref 70–99)
Potassium: 3.4 mmol/L — ABNORMAL LOW (ref 3.5–5.1)
Potassium: 4.5 mmol/L (ref 3.5–5.1)
Sodium: 139 mmol/L (ref 135–145)
Sodium: 137 mmol/L (ref 135–145)

## 2020-07-13 LAB — KOH PREP

## 2020-07-13 LAB — HEMOGLOBIN A1C
Hgb A1c MFr Bld: 12.8 % — ABNORMAL HIGH (ref 4.8–5.6)
Mean Plasma Glucose: 320.66 mg/dL

## 2020-07-13 SURGERY — ESOPHAGOGASTRODUODENOSCOPY (EGD) WITH PROPOFOL
Anesthesia: General

## 2020-07-13 MED ORDER — LABETALOL HCL 5 MG/ML IV SOLN
10.0000 mg | INTRAVENOUS | Status: DC | PRN
  Administered 2020-07-13 – 2020-07-14 (×4): 10 mg via INTRAVENOUS
  Filled 2020-07-13 (×4): qty 4

## 2020-07-13 MED ORDER — PROPOFOL 10 MG/ML IV BOLUS
INTRAVENOUS | Status: AC
  Filled 2020-07-13: qty 20

## 2020-07-13 MED ORDER — PROPOFOL 10 MG/ML IV BOLUS
INTRAVENOUS | Status: DC | PRN
  Administered 2020-07-13: 200 mg via INTRAVENOUS

## 2020-07-13 MED ORDER — INSULIN GLARGINE 100 UNIT/ML ~~LOC~~ SOLN
8.0000 [IU] | Freq: Every day | SUBCUTANEOUS | Status: DC
  Administered 2020-07-14: 8 [IU] via SUBCUTANEOUS
  Filled 2020-07-13 (×3): qty 0.08

## 2020-07-13 MED ORDER — LABETALOL HCL 5 MG/ML IV SOLN
20.0000 mg | Freq: Once | INTRAVENOUS | Status: AC
  Administered 2020-07-13: 20 mg via INTRAVENOUS
  Filled 2020-07-13: qty 4

## 2020-07-13 MED ORDER — INSULIN ASPART 100 UNIT/ML ~~LOC~~ SOLN
0.0000 [IU] | Freq: Three times a day (TID) | SUBCUTANEOUS | Status: DC
  Administered 2020-07-13: 2 [IU] via SUBCUTANEOUS
  Administered 2020-07-13: 5 [IU] via SUBCUTANEOUS
  Administered 2020-07-13: 2 [IU] via SUBCUTANEOUS
  Administered 2020-07-14: 5 [IU] via SUBCUTANEOUS
  Administered 2020-07-14: 2 [IU] via SUBCUTANEOUS
  Administered 2020-07-14: 5 [IU] via SUBCUTANEOUS
  Administered 2020-07-15 (×2): 2 [IU] via SUBCUTANEOUS

## 2020-07-13 MED ORDER — SODIUM CHLORIDE 0.9 % IV SOLN: INTRAVENOUS | Status: DC

## 2020-07-13 MED ORDER — INSULIN ASPART 100 UNIT/ML ~~LOC~~ SOLN
0.0000 [IU] | Freq: Every day | SUBCUTANEOUS | Status: DC
Start: 2020-07-13 — End: 2020-07-16
  Administered 2020-07-15: 2 [IU] via SUBCUTANEOUS

## 2020-07-13 MED ORDER — STERILE WATER FOR IRRIGATION IR SOLN
Status: DC | PRN
  Administered 2020-07-13: 200 mL

## 2020-07-13 MED ORDER — LACTATED RINGERS IV SOLN: INTRAVENOUS | Status: DC | PRN

## 2020-07-13 MED ORDER — LACTATED RINGERS IV SOLN: INTRAVENOUS | Status: DC

## 2020-07-13 MED ORDER — SODIUM CHLORIDE 0.9 % IV BOLUS: 1000.0000 mL | Freq: Once | INTRAVENOUS | Status: DC

## 2020-07-13 MED ORDER — PANTOPRAZOLE SODIUM 40 MG IV SOLR
40.0000 mg | Freq: Two times a day (BID) | INTRAVENOUS | Status: DC
  Administered 2020-07-13 – 2020-07-16 (×6): 40 mg via INTRAVENOUS
  Filled 2020-07-13 (×7): qty 40

## 2020-07-13 MED ORDER — SUCRALFATE 1 G PO TABS
1.0000 g | ORAL_TABLET | Freq: Three times a day (TID) | ORAL | Status: DC
  Administered 2020-07-13 – 2020-07-16 (×11): 1 g via ORAL
  Filled 2020-07-13 (×11): qty 1

## 2020-07-13 NOTE — Anesthesia Postprocedure Evaluation (Signed)
Anesthesia Post Note  Patient: Ann Moyer  Procedure(s) Performed: ESOPHAGOGASTRODUODENOSCOPY (EGD) WITH PROPOFOL (N/A ) BIOPSY  Patient location during evaluation: Phase II Anesthesia Type: General Level of consciousness: awake Pain management: pain level controlled Vital Signs Assessment: post-procedure vital signs reviewed and stable Respiratory status: spontaneous breathing and respiratory function stable Cardiovascular status: blood pressure returned to baseline and stable Postop Assessment: no headache and no apparent nausea or vomiting Anesthetic complications: no Comments:     No complications documented.   Last Vitals:  Vitals:   07/13/20 0918 07/13/20 1209  BP:  (!) 166/93  Pulse:  91  Resp:  18  Temp: 37.1 C   SpO2:  99%    Last Pain:  Vitals:   07/13/20 1209  TempSrc:   PainSc: 6                  Windell Norfolk

## 2020-07-13 NOTE — Progress Notes (Signed)
Inpatient Diabetes Program Recommendations  AACE/ADA: New Consensus Statement on Inpatient Glycemic Control (2015)  Target Ranges:  Prepandial:   less than 140 mg/dL      Peak postprandial:   less than 180 mg/dL (1-2 hours)      Critically ill patients:  140 - 180 mg/dL   Lab Results  Component Value Date   GLUCAP 187 (H) 07/13/2020    Review of Glycemic Control  Diabetes history: DM1 (makes no insulin) Outpatient Diabetes medications: Lantus 16 units daily + Novolog 2-5 units bid ac meals Current orders for Inpatient glycemic control: Novolog 0-9 units tid + 0-5 units hs  Inpatient Diabetes Program Recommendations:   -Give Lantus 12 units no (80% home basal dose=12.8 units) -Change Novolog correction to q 4 hrs. While NPO Secure chat sent to Dr. Mariea Clonts.  Thank you, Ann Moyer. Ann Farrelly, RN, MSN, CDE  Diabetes Coordinator Inpatient Glycemic Control Team Team Pager [phone] (8am-5pm) 07/13/2020 11:20 AM

## 2020-07-13 NOTE — Transfer of Care (Signed)
Immediate Anesthesia Transfer of Care Note  Patient: Fiona Coto  Procedure(s) Performed: ESOPHAGOGASTRODUODENOSCOPY (EGD) WITH PROPOFOL (N/A )  Patient Location: PACU  Anesthesia Type:General  Level of Consciousness: drowsy  Airway & Oxygen Therapy: Patient Spontanous Breathing  Post-op Assessment: Report given to RN and Post -op Vital signs reviewed and stable  Post vital signs: Reviewed and stable  Last Vitals:  Vitals Value Taken Time  BP 166/93 07/13/20 1209  Temp    Pulse 91 07/13/20 1209  Resp 18 07/13/20 1209  SpO2 99 % 07/13/20 1209    Last Pain:  Vitals:   07/13/20 1209  TempSrc:   PainSc: 6          Complications: No complications documented.

## 2020-07-13 NOTE — Anesthesia Preprocedure Evaluation (Signed)
Anesthesia Evaluation  Patient identified by MRN, date of birth, ID band Patient awake    Reviewed: Allergy & Precautions, H&P , NPO status , Patient's Chart, lab work & pertinent test results, reviewed documented beta blocker date and time   Airway Mallampati: II  TM Distance: >3 FB Neck ROM: full    Dental no notable dental hx. (+) Teeth Intact   Pulmonary asthma , Current Smoker,    Pulmonary exam normal breath sounds clear to auscultation       Cardiovascular Exercise Tolerance: Good hypertension,  Rhythm:regular Rate:Normal     Neuro/Psych PSYCHIATRIC DISORDERS negative neurological ROS     GI/Hepatic negative GI ROS, Neg liver ROS,   Endo/Other  negative endocrine ROSdiabetes  Renal/GU Renal disease  negative genitourinary   Musculoskeletal   Abdominal   Peds  Hematology negative hematology ROS (+)   Anesthesia Other Findings   Reproductive/Obstetrics negative OB ROS                             Anesthesia Physical Anesthesia Plan  ASA: II and emergent  Anesthesia Plan: General   Post-op Pain Management:    Induction:   PONV Risk Score and Plan: Propofol infusion  Airway Management Planned:   Additional Equipment:   Intra-op Plan:   Post-operative Plan:   Informed Consent: I have reviewed the patients History and Physical, chart, labs and discussed the procedure including the risks, benefits and alternatives for the proposed anesthesia with the patient or authorized representative who has indicated his/her understanding and acceptance.     Dental Advisory Given  Plan Discussed with: CRNA  Anesthesia Plan Comments:         Anesthesia Quick Evaluation

## 2020-07-13 NOTE — Consult Note (Signed)
Consulting  Provider: Dr. Adrian Blackwater Primary Care Physician:  [facility] Primary Gastroenterologist:  None  Reason for Consultation:  Melena, epigastric pain  HPI:  Ann Moyer is a 30 y.o. female with a past medical history of type 1 diabetes, chronic kidney disease, hypertension, who presented to [name], ER yesterday evening with chief complaint of melena and epigastric abdominal pain.  Patient states her symptoms started approximately 3 days ago and have progressively worsened in intensity.  Primarily epigastric, but does note some radiation to her right upper quadrant.  Also notes black tarry stools as well as occasional diarrhea.  No hematochezia.  States she had an EGD years ago though cannot remember the findings.  Denies any history of gastroparesis but does note that she was believed to have this in the past.  She has been taking ibuprofen regularly over the last 4 weeks due to back pain from an injury.  No fevers or chills but does note some generalized weakness/fatigue.    Work-up in the ER revealed significant hyperglycemia.  Initial hemoglobin 12.4, 12.1 this a.m.  This does to appear to be around her baseline.  She had a CT abdomen pelvis which I personally reviewed which showed diffuse wall and fold thickening noted throughout the colon which may represent lack of distention versus infectious or inflammatory colitis.  No previous colonoscopy.  No family history of inflammatory bowel disease such as Crohn's disease or ulcerative colitis.    Past Medical History:  Diagnosis Date  . Asthma   . Diabetes mellitus without complication [facility])     Past Surgical History:  Procedure Laterality Date  . EYE SURGERY      Prior to Admission medications   Medication Sig Start Date End Date Taking? Authorizing Provider  acetaminophen (TYLENOL) 325 MG tablet Take 650 mg by mouth every 6 (six) hours as needed. 05/28/14  Yes [provider]  albuterol (VENTOLIN HFA) 108 (90  Base) MCG/ACT inhaler Inhale 2 puffs into the lungs every 4 (four) hours as needed for wheezing or shortness of breath. 01/26/13  Yes [provider]  amphetamine-dextroamphetamine (ADDERALL) 20 MG tablet Take 20 mg by mouth daily. 06/10/20  Yes [provider]  buprenorphine-naloxone (SUBOXONE) 8-2 mg SUBL SL tablet Place 1 tablet under the tongue See admin instructions. Dissolve i1/2 tablet by mouth 3 times a day 07/10/20  Yes [provider]  busPIRone (BUSPAR) 10 MG tablet Take 10 mg by mouth daily as needed (anxiety).   Yes [provider]  etonogestrel (NEXPLANON) 68 MG IMPL implant 68 mg by Subdermal route once. 04/03/20  Yes [provider]  glucagon 1 MG injection Inject 1 mg into the vein once as needed. 08/12/10  Yes [provider]  insulin aspart (NOVOLOG) 100 UNIT/ML FlexPen Inject 2-5 Units into the skin 2 (two) times daily before a meal. Sliding scale 04/03/20  Yes [provider]  insulin glargine (LANTUS) 100 UNIT/ML Solostar Pen Inject 16 Units into the skin at bedtime. 02/08/20  Yes [provider]  Melatonin 10 MG TABS Take 1 tablet by mouth daily.   Yes [provider]  OLANZapine (ZYPREXA) 5 MG tablet Take 5 mg by mouth daily. 07/10/20 08/09/20 Yes [provider]  omeprazole (PRILOSEC) 20 MG capsule Take 20 mg by mouth daily.   Yes [provider]  ondansetron (ZOFRAN-ODT) 4 MG disintegrating tablet Take 4 mg by mouth every 8 (eight) hours as needed for nausea or vomiting. 07/10/20  Yes [provider]  promethazine (PHENERGAN) 25 MG tablet Take 25 mg by mouth every 4 (four) hours as needed. 07/10/20  Yes [provider]  sertraline (ZOLOFT) 50 MG tablet Take 1 tablet by mouth daily. 07/28/19 07/27/20 Yes [provider]  SUMAtriptan Succinate Refill 6 MG/0.5ML SOCT Inject into the skin. 09/27/19 09/26/20 Yes [provider]  tranexamic acid (LYSTEDA) 650 MG TABS  tablet Take 1,300 mg by mouth 3 (three) times daily. 02/01/20  Yes [provider]    Current Facility-Administered Medications  Medication Dose Route Frequency Provider Last Rate Last Admin  . amphetamine-dextroamphetamine (ADDERALL) tablet 20 mg  20 mg Oral Daily Levie Heritage, DO      . buprenorphine-naloxone (SUBOXONE) 8-2 mg per SL tablet 1 tablet  1 tablet Sublingual See admin instructions Levie Heritage, DO   1 tablet at 07/12/20 2143  . busPIRone (BUSPAR) tablet 10 mg  10 mg Oral Daily PRN Levie Heritage, DO      . dextrose 5 % in lactated ringers infusion   Intravenous Continuous Levie Heritage, DO   Stopped at 07/13/20 0120  . dextrose 5 % in lactated ringers infusion   Intravenous Continuous Levie Heritage, DO      . dextrose 50 % solution 0-50 mL  0-50 mL Intravenous PRN Levie Heritage, DO      . dextrose 50 % solution 0-50 mL  0-50 mL Intravenous PRN Levie Heritage, DO      . hydrALAZINE (APRESOLINE) injection 5 mg  5 mg Intravenous Once Levie Heritage, DO      . HYDROmorphone (DILAUDID) injection 0.5 mg  0.5 mg Intravenous Q3H PRN Levie Heritage, DO   0.5 mg at 07/13/20 0848  . insulin aspart (novoLOG) injection 0-5 Units  0-5 Units Subcutaneous QHS Adefeso, Oladapo, DO      . insulin aspart (novoLOG) injection 0-9 Units  0-9 Units Subcutaneous TID WC Adefeso, Oladapo, DO   5 Units at 07/13/20 0811  . insulin regular, human (MYXREDLIN) 100 units/ 100 mL infusion   Intravenous Continuous Levie Heritage, DO      . lactated ringers infusion   Intravenous Continuous Levie Heritage, DO 125 mL/hr at 07/13/20 0148 Restarted at 07/13/20 0148  . lactated ringers infusion   Intravenous Continuous Levie Heritage, DO 125 mL/hr at 07/13/20 0521 New Bag at 07/13/20 0521  . metoCLOPramide (REGLAN) tablet 10 mg  10 mg Oral Q6H Levie Heritage, DO   10 mg at 07/13/20 0809  . OLANZapine (ZYPREXA) tablet 5 mg  5 mg Oral Daily Levie Heritage, DO   5 mg at 07/13/20  0809  . ondansetron (ZOFRAN) tablet 4 mg  4 mg Oral Q6H PRN Levie Heritage, DO       Or  . ondansetron [facility]) injection 4 mg  4 mg Intravenous Q6H PRN Levie Heritage, DO   4 mg at 07/13/20 0521  . pantoprazole (PROTONIX) EC tablet 40 mg  40 mg Oral Daily Levie Heritage, DO   40 mg at 07/13/20 0809  . promethazine (PHENERGAN) tablet 25 mg  25 mg Oral Q4H PRN Levie Heritage, DO   25 mg at 07/13/20 1610  . sertraline (ZOLOFT) tablet 50 mg  50 mg Oral Daily Levie Heritage, DO   50 mg at 07/13/20 0809    Allergies as of 07/12/2020 - Review Complete 07/12/2020  Allergen Reaction Noted  . Amitriptyline Swelling 07/12/2020  . Gabapentin  02/19/2019  .  Haldol [haloperidol]  07/12/2020  . Lyrica [pregabalin]  02/19/2019  . Toradol [ketorolac tromethamine] Nausea Only 02/19/2019  . Tramadol  02/19/2019    History reviewed. No pertinent family history.  Social History   Socioeconomic History  . Marital status: Single    Spouse name: Not on file  . Number of children: Not on file  . Years of education: Not on file  . Highest education level: Not on file  Occupational History  . Not on file  Tobacco Use  . Smoking status: Current Every Day Smoker    Packs/day: 0.50  . Smokeless tobacco: Never Used  Substance and Sexual Activity  . Alcohol use: Not Currently  . Drug use: Yes    Types: Marijuana  . Sexual activity: Not on file  Other Topics Concern  . Not on file  Social History Narrative  . Not on file   Social Determinants of Health   Financial Resource Strain: Not on file  Food Insecurity: Not on file  Transportation Needs: Not on file  Physical Activity: Not on file  Stress: Not on file  Social Connections: Not on file  Intimate Partner Violence: Not on file    Review of Systems: General: Negative for anorexia, weight loss, fever, chills, fatigue, weakness. Eyes: Negative for vision changes.  ENT: Negative for hoarseness, difficulty swallowing , nasal  congestion. CV: Negative for chest pain, angina, palpitations, dyspnea on exertion, peripheral edema.  Respiratory: Negative for dyspnea at rest, dyspnea on exertion, cough, sputum, wheezing.  GI: See history of present illness. GU:  Negative for dysuria, hematuria, urinary incontinence, urinary frequency, nocturnal urination.  MS: Negative for joint pain, low back pain.  Derm: Negative for rash or itching.  Neuro: Negative for weakness, abnormal sensation, seizure, frequent headaches, memory loss, confusion.  Psych: Negative for anxiety, depression Endo: Negative for unusual weight change.  Heme: Negative for bruising or bleeding. Allergy: Negative for rash or hives.  Physical Exam: Vital signs in last 24 hours: Temp:  [98.4 F (36.9 C)-98.7 F (37.1 C)] 98.7 F (37.1 C) (04/09 0918) Pulse Rate:  [90-98] 91 (04/09 0915) Resp:  [10-22] 18 (04/09 0612) BP: (122-194)/(83-109) 168/103 (04/09 0915) SpO2:  [97 %-100 %] 97 % (04/09 0915) Weight:  [48.8 kg-49.9 kg] 48.8 kg (04/09 0324)   General:   Alert,  Well-developed, well-nourished, pleasant and cooperative in NAD Head:  Normocephalic and atraumatic. Eyes:  Sclera clear, no icterus.   Conjunctiva pink. Ears:  Normal auditory acuity. Nose:  No deformity, discharge,  or lesions. Mouth:  No deformity or lesions, dentition normal. Neck:  Supple; no masses or thyromegaly. Lungs:  Clear throughout to auscultation.   No wheezes, crackles, or rhonchi. No acute distress. Heart:  Regular rate and rhythm; no murmurs, clicks, rubs,  or gallops. Abdomen:  Soft, tender to palpation epigastric region and nondistended. No masses, hepatosplenomegaly or hernias noted. Normal bowel sounds, without guarding, and without rebound.   Rectal:  Deferred until time of colonoscopy.   Msk:  Symmetrical without gross deformities. Normal posture. Pulses:  Normal pulses noted. Extremities:  Without clubbing or edema. Neurologic:  Alert and  oriented x4;   grossly normal neurologically. Skin:  Intact without significant lesions or rashes. Cervical Nodes:  No significant cervical adenopathy. Psych:  Alert and cooperative. Normal mood and affect.  Intake/Output from previous day: 04/08 0701 - 04/09 0700 In: 2804.5 [I.V.:1386.1; IV Piggyback:1418.5] Out: -  Intake/Output this shift: No intake/output data recorded.  Lab Results: Recent Labs  07/12/20 1124 07/12/20 2314 07/13/20 0445  WBC 15.6* 17.2* 16.9*  HGB 12.3 12.4 12.1  HCT 35.4* 37.5 38.1  PLT 265 231 216   BMET Recent Labs    07/12/20 1124 07/12/20 2327 07/13/20 0445  NA 134* 139 137  K 3.9 3.4* 4.5  CL 94* 103 104  CO2 26 25 23   GLUCOSE 493* 235* 301*  BUN 38* 32* 34*  CREATININE 1.80* 1.53* 1.75*  CALCIUM 9.5 9.4 8.9   LFT Recent Labs    07/12/20 1124  PROT 7.3  ALBUMIN 3.6  AST 20  ALT 20  ALKPHOS 153*  BILITOT 0.8   PT/INR No results for input(s): LABPROT, INR in the last 72 hours. Hepatitis Panel No results for input(s): HEPBSAG, HCVAB, HEPAIGM, HEPBIGM in the last 72 hours. C-Diff No results for input(s): CDIFFTOX in the last 72 hours.  Studies/Results: CT Abdomen Pelvis W Contrast  Result Date: 07/12/2020 CLINICAL DATA:  Acute generalized abdominal pain. EXAM: CT ABDOMEN AND PELVIS WITH CONTRAST TECHNIQUE: Multidetector CT imaging of the abdomen and pelvis was performed using the standard protocol following bolus administration of intravenous contrast. CONTRAST:  12mL OMNIPAQUE IOHEXOL 300 MG/ML  SOLN COMPARISON:  None. FINDINGS: Lower chest: No acute abnormality. Hepatobiliary: No focal liver abnormality is seen. No gallstones, gallbladder wall thickening, or biliary dilatation. Pancreas: Unremarkable. No pancreatic ductal dilatation or surrounding inflammatory changes. Spleen: Normal in size without focal abnormality. Adrenals/Urinary Tract: Adrenal glands are unremarkable. Kidneys are normal, without renal calculi, focal lesion, or  hydronephrosis. Bladder is unremarkable. Stomach/Bowel: The stomach and appendix appear normal. There is no evidence of bowel obstruction. There is noted diffuse wall and fold thickening involving the colon which may simply represent lack of distension, but infectious or inflammatory colitis cannot be excluded. Vascular/Lymphatic: No significant vascular findings are present. No enlarged abdominal or pelvic lymph nodes. Reproductive: Uterus and bilateral adnexa are unremarkable. Other: No abdominal wall hernia or abnormality. No abdominopelvic ascites. Musculoskeletal: No acute or significant osseous findings. IMPRESSION: Diffuse wall and fold thickening is noted throughout the colon which may simply represent lack of distension, but infectious or inflammatory colitis cannot be excluded. Electronically Signed   By: 71m M.D.   On: 07/12/2020 15:25    Impression: *Melena *Epigastric pain *Type 1 diabetes-uncontrolled *Nausea  Plan: Etiology of patient's epigastric pain and melena unclear.  Given her chronic NSAID use, peptic ulcer disease certainly high on differential. Other possible etiology include gastritis, duodenitis, AVMs, Dieulafoy lesion, polyp, malignancy, or other.  We will proceed with EGD to further evaluate.   The risks including infection, bleed, or perforation as well as benefits, limitations, alternatives and imponderables have been reviewed with the patient. Potential for esophageal dilation, biopsy, etc. have also been reviewed.  Questions have been answered. All parties agreeable.  IV PPI twice daily.  Continue monitor hemoglobin transfuse for less than 7.  Keep n.p.o. for now.  Further recommendations to follow.   09/11/2020. Hennie Duos, D.O. Gastroenterology and Hepatology [facility]    LOS: 1 day     07/13/2020, 10:16 AM

## 2020-07-13 NOTE — Progress Notes (Addendum)
Patient Demographics:    Ann Moyer, is a 30 y.o. female, DOB - 01/12/1991, FUX:[id number]  Admit date - 07/12/2020   Admitting Physician Frankey Shown, DO  Outpatient Primary [facility] for the patient is [facility]  LOS - 1   Chief Complaint  Patient presents with  . Abdominal Pain       Subjective:    Ed Blalock today has no fevers,  No chest pain,   -Epigastric discomfort and nausea persist No further melena -Voiding okay  Assessment  & Plan :    Principal Problem:   Upper GI bleed Active Problems:   AKI (acute kidney injury) (HCC)   Borderline personality disorder (HCC)   Diabetic gastroparesis (HCC)   Diabetic neuropathy (HCC)   Hypertension   Opioid use with opioid-induced disorder (HCC)   Type 1 diabetes mellitus not at goal [facility])  Brief Summary:- 30 y.o. female with a history of type I diabetes, chronic kidney disease, hypertension, bipolar/depression admitted on 07/12/20 with abdominal pain and concern for melena and possible colitis as well   A/p 1)Epigastric Pain and Melena in the setting of NSAID use--- concerns for GI bleed GI input appreciated plan for EGD -Stool occult blood is positive -Hgb Stable above 12 -Continue IV Protonix  2)DM1- A1C was 13.8  on 06/05/20, reflecting uncontrolled DM with hyperglycemia  --will decrease Lantus to 8 units daily as patient is n.p.o. to avoid hypoglycemia -Diabetic educator input appreciated Use Novolog/Humalog Sliding scale insulin with Accu-Cheks/Fingersticks as ordered -Suspicion of diabetic gastroparesis previously with patient states that her evaluation recently revealed no significant evidence for gastroparesis   3)Possible Colitis-- CT Abd/Pelvis showed diffuse wall and fold thickening noted throughout the colon which may represent lack of distention versus infectious or inflammatory colitis.  No previous colonoscopy.  No  family history of inflammatory bowel disease such as Crohn's disease or ulcerative colitis.   --GI consulted  4) opiate addiction--- continue Suboxone and as needed opiates while here -UDS with opiates and THC  5)HTN--labetalol as needed elevated BP  6)ADD + anxiety and depression--- continue Adderall, Zoloft and Zyprexa as well as BuSpar  7)AKI----acute kidney injury --creatinine is up to 1.75 from 1.5 on admission  -Creatinine was 1.1 on 07/11/2020 at [facility] -renally adjust medications, avoid nephrotoxic agents / dehydration  / hypotension    Disposition/Need for in-Hospital Stay- patient unable to be discharged at this time due to -melena and heme positive stool requiring further work-up for possible active GI bleed*  Status is: Inpatient  Remains inpatient appropriate because:Please see above  Disposition: The patient is from: Home              Anticipated d/c is to: Home              Anticipated d/c date is: 2 days              Patient currently is not medically stable to d/c. Barriers: Not Clinically Stable-   Code Status :   Code Status: Full Code  Family Communication:    NA (patient is alert, awake and coherent)   Consults  :  Gi  DVT Prophylaxis  :   - SCDs   SCDs Start: 07/12/20 2252    Lab Results  Component Value Date   PLT 216 07/13/2020    Inpatient Medications  Scheduled Meds: . [MAR Hold] amphetamine-dextroamphetamine  20 mg Oral Daily  . [MAR Hold] buprenorphine-naloxone  1 tablet Sublingual See admin instructions  . [MAR Hold] hydrALAZINE  5 mg Intravenous Once  . [MAR Hold] insulin aspart  0-5 Units Subcutaneous QHS  . [MAR Hold] insulin aspart  0-9 Units Subcutaneous TID WC  . [MAR Hold] insulin glargine  8 Units Subcutaneous Daily  . [MAR Hold] metoCLOPramide  10 mg Oral Q6H  . [MAR Hold] OLANZapine  5 mg Oral Daily  . pantoprazole (PROTONIX) IV  40 mg Intravenous Q12H  . [MAR Hold] sertraline  50 mg Oral Daily   Continuous  Infusions: . sodium chloride Stopped (07/13/20 1213)   PRN Meds:.[MAR Hold] busPIRone, [MAR Hold] dextrose, [MAR Hold] dextrose, [MAR Hold]  HYDROmorphone (DILAUDID) injection, [MAR Hold] ondansetron **OR** [MAR Hold] ondansetron (ZOFRAN) IV, [MAR Hold] promethazine    Anti-infectives (From admission, onward)   None        Objective:   Vitals:   07/13/20 0612 07/13/20 0915 07/13/20 0918 07/13/20 1209  BP: (!) 187/106 (!) 168/103  (!) 166/93  Pulse: 93 91  91  Resp: 18   18  Temp: 98.6 F (37 C)  98.7 F (37.1 C)   TempSrc: Oral  Oral   SpO2: 98% 97%  99%  Weight:      Height:        Wt Readings from Last 3 Encounters:  07/13/20 48.8 kg  02/19/19 49.9 kg     Intake/Output Summary (Last 24 hours) at 07/13/2020 1216 Last data filed at 07/13/2020 1214 Gross per 24 hour  Intake 3586.7 ml  Output --  Net 3586.7 ml    Physical Exam  Gen:- Awake Alert,  In no apparent distress  HEENT:- [place].AT, No sclera icterus Neck-Supple Neck,No JVD,.  Lungs-  CTAB , fair symmetrical air movement CV- S1, S2 normal, regular  Abd-  +ve B.Sounds, Abd Soft, epigastric tenderness,   no rebound or guarding Extremity/Skin:- No  edema, pedal pulses present  Psych-affect is appropriate, oriented x3 Neuro-no new focal deficits, no tremors   Data Review:   Micro Results Recent Results (from the past 240 hour(s))  Resp Panel by RT-PCR (Flu A&B, Covid) Nasopharyngeal Swab     Status: None   Collection Time: 07/12/20  4:19 PM   Specimen: Nasopharyngeal Swab; Nasopharyngeal(NP) swabs in vial transport medium  Result Value Ref Range Status   SARS Coronavirus 2 by RT PCR NEGATIVE NEGATIVE Final    Comment: (NOTE) SARS-CoV-2 target nucleic acids are NOT DETECTED.  The SARS-CoV-2 RNA is generally detectable in upper respiratory specimens during the acute phase of infection. The lowest concentration of SARS-CoV-2 viral copies this assay can detect is 138 copies/mL. A negative result does not  preclude SARS-Cov-2 infection and should not be used as the sole basis for treatment or other patient management decisions. A negative result may occur with  improper specimen collection/handling, submission of specimen other than nasopharyngeal swab, presence of viral mutation(s) within the areas targeted by this assay, and inadequate number of viral copies(<138 copies/mL). A negative result must be combined with clinical observations, patient history, and epidemiological information. The expected result is Negative.  Fact Sheet for Patients:  BloggerCourse.com  Fact Sheet for Healthcare Providers:  SeriousBroker.it  This test is no t yet approved or cleared by the [place] FDA and  has been authorized for detection and/or diagnosis of SARS-CoV-2 by FDA under an Emergency Use Authorization (EUA). This EUA will remain  in effect (meaning this test can be used) for the duration of the COVID-19 declaration under Section 564(b)(1) of the Act, 21 U.S.C.section 360bbb-3(b)(1), unless the authorization is terminated  or revoked sooner.       Influenza A by PCR NEGATIVE NEGATIVE Final   Influenza B by PCR NEGATIVE NEGATIVE Final    Comment: (NOTE) The Xpert Xpress SARS-CoV-2/FLU/RSV plus assay is intended as an aid in the diagnosis of influenza from Nasopharyngeal swab specimens and should not be used as a sole basis for treatment. Nasal washings and aspirates are unacceptable for Xpert Xpress SARS-CoV-2/FLU/RSV testing.  Fact Sheet for Patients: BloggerCourse.com  Fact Sheet for Healthcare Providers: SeriousBroker.it  This test is not yet approved or cleared by the [place] FDA and has been authorized for detection and/or diagnosis of SARS-CoV-2 by FDA under an Emergency Use Authorization (EUA). This EUA will remain in effect (meaning this test can be used) for the  duration of the COVID-19 declaration under Section 564(b)(1) of the Act, 21 U.S.C. section 360bbb-3(b)(1), unless the authorization is terminated or revoked.  Performed at [facility], [address], [place], [place] [address]     Radiology Reports CT Abdomen Pelvis W Contrast  Result Date: 07/12/2020 CLINICAL DATA:  Acute generalized abdominal pain. EXAM: CT ABDOMEN AND PELVIS WITH CONTRAST TECHNIQUE: Multidetector CT imaging of the abdomen and pelvis was performed using the standard protocol following bolus administration of intravenous contrast. CONTRAST:  33mL OMNIPAQUE IOHEXOL 300 MG/ML  SOLN COMPARISON:  None. FINDINGS: Lower chest: No acute abnormality. Hepatobiliary: No focal liver abnormality is seen. No gallstones, gallbladder wall thickening, or biliary dilatation. Pancreas: Unremarkable. No pancreatic ductal dilatation or surrounding inflammatory changes. Spleen: Normal in size without focal abnormality. Adrenals/Urinary Tract: Adrenal glands are unremarkable. Kidneys are normal, without renal calculi, focal lesion, or hydronephrosis. Bladder is unremarkable. Stomach/Bowel: The stomach and appendix appear normal. There is no evidence of bowel obstruction. There is noted diffuse wall and fold thickening involving the colon which may simply represent lack of distension, but infectious or inflammatory colitis cannot be excluded. Vascular/Lymphatic: No significant vascular findings are present. No enlarged abdominal or pelvic lymph nodes. Reproductive: Uterus and bilateral adnexa are unremarkable. Other: No abdominal wall hernia or abnormality. No abdominopelvic ascites. Musculoskeletal: No acute or significant osseous findings. IMPRESSION: Diffuse wall and fold thickening is noted throughout the colon which may simply represent lack of distension, but infectious or inflammatory colitis cannot be excluded. Electronically Signed   By: Lupita Raider M.D.   On: 07/12/2020 15:25     CBC Recent  Labs  Lab 07/12/20 1124 07/12/20 2314 07/13/20 0445  WBC 15.6* 17.2* 16.9*  HGB 12.3 12.4 12.1  HCT 35.4* 37.5 38.1  PLT 265 231 216  MCV 87.2 89.5 92.7  MCH 30.3 29.6 29.4  MCHC 34.7 33.1 31.8  RDW 11.7 11.9 11.7  LYMPHSABS 1.0  --   --   MONOABS 0.2  --   --   EOSABS 0.0  --   --   BASOSABS 0.1  --   --     Chemistries  Recent Labs  Lab 07/12/20 1124 07/12/20 2327 07/13/20 0445  NA 134* 139 137  K 3.9 3.4* 4.5  CL 94* 103 104  CO2 26 25 23   GLUCOSE 493* 235* 301*  BUN 38* 32* 34*  CREATININE 1.80* 1.53* 1.75*  CALCIUM 9.5 9.4 8.9  AST 20  --   --   ALT 20  --   --   ALKPHOS 153*  --   --   BILITOT 0.8  --   --    ------------------------------------------------------------------------------------------------------------------ No results for input(s): CHOL, HDL, LDLCALC, TRIG, CHOLHDL, LDLDIRECT in the last 72 hours.  No results found for: HGBA1C ------------------------------------------------------------------------------------------------------------------ No results for input(s): TSH, T4TOTAL, T3FREE, THYROIDAB in the last 72 hours.  Invalid input(s): FREET3 ------------------------------------------------------------------------------------------------------------------ No results for input(s): VITAMINB12, FOLATE, FERRITIN, TIBC, IRON, RETICCTPCT in the last 72 hours.  Coagulation profile No results for input(s): INR, PROTIME in the last 168 hours.  No results for input(s): DDIMER in the last 72 hours.  Cardiac Enzymes No results for input(s): CKMB, TROPONINI, MYOGLOBIN in the last 168 hours.  Invalid input(s): CK ------------------------------------------------------------------------------------------------------------------ No results found for: BNP   M.D on 07/13/2020 at 12:16 PM  Go to [email or web address] - for contact info  [facility] - Office  [phone]      \

## 2020-07-13 NOTE — Op Note (Signed)
[facility] Patient Name: Ann Moyer Procedure Date: 07/13/2020 10:43 AM MRN: [id number] Date of Birth: 1991/01/19 Attending MD: Elon Alas. Abbey Chatters DO CSN: [id number] Age: 30 Admit Type: Inpatient Procedure:                Upper GI endoscopy Indications:              Epigastric abdominal pain, Melena Providers:                Elon Alas. Abbey Chatters, DO, Lurline Del, RN, Wynonia Musty Tech, Technician Referring MD:              Medicines:                See the Anesthesia note for documentation of the                            administered medications Complications:            No immediate complications. Estimated Blood Loss:     Estimated blood loss was minimal. Procedure:                Pre-Anesthesia Assessment:                           - The anesthesia plan was to use monitored                            anesthesia care (MAC).                           After obtaining informed consent, the endoscope was                            passed under direct vision. Throughout the                            procedure, the patient's blood pressure, pulse, and                            oxygen saturations were monitored continuously. The                            [id number]-[id number] ([id number]) scope was introduced through the                            mouth, and advanced to the second part of duodenum.                            The upper GI endoscopy was accomplished without                            difficulty. The patient tolerated the procedure                            well. Scope In:  12:13:30 PM Scope Out: 12:18:47 PM Total Procedure Duration: 0 hours 5 minutes 17 seconds  Findings:      LA Grade D (one or more mucosal breaks involving at least 75% of       esophageal circumference) esophagitis with no bleeding was found in the       lower third of the esophagus. Cells for cytology were obtained by       brushing.      Mucosal changes including ringed  esophagus, longitudinal furrows and       white plaques were found in the middle third of the esophagus.       Esophageal findings were graded using the Eosinophilic Esophagitis       Endoscopic Reference Score (EoE-EREFS) as: Edema Grade 0 Normal       (distinct vascular markings), Rings Grade 2 Moderate (distinct rings       that do not occlude passage of diagnostic 8-10 mm endoscope), Exudates       Grade 1 Mild (scattered white lesions involving less than 10 percent of       the esophageal surface area), Furrows Grade 1 Present (vertical lines       with or without visible depth) and Stricture none (no stricture found).       Biopsies were taken with a cold forceps for histology.      Diffuse moderate inflammation characterized by erosions and erythema was       found in the entire examined stomach. Biopsies were taken with a cold       forceps for Helicobacter pylori testing.      The duodenal bulb, first portion of the duodenum and second portion of       the duodenum were normal. Impression:               - LA Grade D erosive esophagitis with no bleeding.                            Cells for cytology obtained.                           - Esophageal mucosal changes suspicious for                            eosinophilic esophagitis. Biopsied.                           - Gastritis. Biopsied.                           - Normal duodenal bulb, first portion of the                            duodenum and second portion of the duodenum. Moderate Sedation:      Per Anesthesia Care Recommendation:           - Return patient to hospital ward for ongoing care.                           - Clear liquid diet.                           -  Continue on IV PPI while inpatient. Will need BID                            PO PPI once stable for discharge. Will follow up on                            pathology. Add Carafate QID. Clear liquids for now.                            Avoid all NSAIDs. Procedure  Code(s):        --- Professional ---                           [phone], Esophagogastroduodenoscopy, flexible,                            transoral; with biopsy, single or multiple Diagnosis Code(s):        --- Professional ---                           K20.80, Other esophagitis without bleeding                           K22.8, Other specified diseases of esophagus                           K29.70, Gastritis, unspecified, without bleeding                           R10.13, Epigastric pain                           K92.1, Melena (includes Hematochezia) CPT copyright 2019 American Medical Association. All rights reserved. The codes documented in this report are preliminary and upon coder review may  be revised to meet current compliance requirements. Elon Alas. Abbey Chatters, DO [place] Abbey Chatters, DO 07/13/2020 12:26:04 PM This report has been signed electronically. Number of Addenda: 0

## 2020-07-13 NOTE — Progress Notes (Signed)
Initial Nutrition Assessment  DOCUMENTATION CODES:   Underweight  INTERVENTION:  Diet advancement as appropriate per MD and team.   NUTRITION DIAGNOSIS:   Inadequate oral intake related to inability to eat as evidenced by NPO status.  GOAL:   Patient will meet greater than or equal to 90% of their needs  MONITOR:   Diet advancement,Skin,Weight trends,Labs,I & O's  REASON FOR ASSESSMENT:   Malnutrition Screening Tool    ASSESSMENT:   30 y.o. female with a history of type I diabetes, chronic kidney disease, hypertension, bipolar/depression admitted with abdominal pain, melena and possible colitis. CT Abd/Pelvis showed diffuse wall and fold thickening noted throughout the colon which may represent lack of distention versus infectious or inflammatory colitis Pt with epigastric Pain and melena in the setting of NSAID use, GI bleed.   Pt underwent EGD today. Pt currently NPO for procedure. RD unable to obtain pt nutrition history at this time. Recommend diet advancement post procedure as appropriate per MD. RD to order nutritional supplements as diet advances. Unable to complete Nutrition-Focused physical exam at this time.   Labs and medications reviewed.   Diet Order:   Diet Order            Diet NPO time specified Except for: Sips with Meds  Diet effective now                 EDUCATION NEEDS:   Not appropriate for education at this time  Skin:  Skin Assessment: Reviewed RN Assessment  Last BM:  4/8  Height:   Ht Readings from Last 1 Encounters:  07/13/20 5\' 5"  (1.651 m)    Weight:   Wt Readings from Last 1 Encounters:  07/13/20 48.8 kg    Ideal Body Weight:  56.8 kg  BMI:  Body mass index is 17.9 kg/m.  Estimated Nutritional Needs:   Kcal:  1800-2000  Protein:  90-100 grams  Fluid:  >/= 1.8 L/day  09/12/20, MS, RD, LDN RD pager number/after hours weekend pager number on Amion.

## 2020-07-14 DIAGNOSIS — R112 Nausea with vomiting, unspecified: Secondary | ICD-10-CM

## 2020-07-14 DIAGNOSIS — R1084 Generalized abdominal pain: Secondary | ICD-10-CM

## 2020-07-14 DIAGNOSIS — K209 Esophagitis, unspecified without bleeding: Secondary | ICD-10-CM | POA: Diagnosis present

## 2020-07-14 DIAGNOSIS — K922 Gastrointestinal hemorrhage, unspecified: Secondary | ICD-10-CM

## 2020-07-14 LAB — CBC
HCT: 35.4 % — ABNORMAL LOW (ref 36.0–46.0)
Hemoglobin: 11.4 g/dL — ABNORMAL LOW (ref 12.0–15.0)
MCH: 29.3 pg (ref 26.0–34.0)
MCHC: 32.2 g/dL (ref 30.0–36.0)
MCV: 91 fL (ref 80.0–100.0)
Platelets: 232 10*3/uL (ref 150–400)
RBC: 3.89 MIL/uL (ref 3.87–5.11)
RDW: 11.6 % (ref 11.5–15.5)
WBC: 13.6 10*3/uL — ABNORMAL HIGH (ref 4.0–10.5)
nRBC: 0 % (ref 0.0–0.2)

## 2020-07-14 LAB — BASIC METABOLIC PANEL
Anion gap: 14 (ref 5–15)
BUN: 39 mg/dL — ABNORMAL HIGH (ref 6–20)
CO2: 19 mmol/L — ABNORMAL LOW (ref 22–32)
Calcium: 8.5 mg/dL — ABNORMAL LOW (ref 8.9–10.3)
Chloride: 100 mmol/L (ref 98–111)
Creatinine, Ser: 2.49 mg/dL — ABNORMAL HIGH (ref 0.44–1.00)
GFR, Estimated: 26 mL/min — ABNORMAL LOW (ref >60)
Glucose, Bld: 324 mg/dL — ABNORMAL HIGH (ref 70–99)
Potassium: 4 mmol/L (ref 3.5–5.1)
Sodium: 133 mmol/L — ABNORMAL LOW (ref 135–145)

## 2020-07-14 LAB — GLUCOSE, CAPILLARY
Glucose-Capillary: 298 mg/dL — ABNORMAL HIGH (ref 70–99)
Glucose-Capillary: 265 mg/dL — ABNORMAL HIGH (ref 70–99)
Glucose-Capillary: 184 mg/dL — ABNORMAL HIGH (ref 70–99)
Glucose-Capillary: 93 mg/dL (ref 70–99)
Glucose-Capillary: 176 mg/dL — ABNORMAL HIGH (ref 70–99)

## 2020-07-14 MED ORDER — ALUM & MAG HYDROXIDE-SIMETH 200-200-20 MG/5ML PO SUSP
30.0000 mL | Freq: Once | ORAL | Status: AC
  Administered 2020-07-14: 30 mL via ORAL
  Filled 2020-07-14: qty 30

## 2020-07-14 MED ORDER — INSULIN GLARGINE 100 UNIT/ML ~~LOC~~ SOLN
15.0000 [IU] | Freq: Every day | SUBCUTANEOUS | Status: DC
  Administered 2020-07-15 – 2020-07-16 (×2): 15 [IU] via SUBCUTANEOUS
  Filled 2020-07-14 (×3): qty 0.15

## 2020-07-14 MED ORDER — NYSTATIN 100000 UNIT/ML MT SUSP
5.0000 mL | Freq: Four times a day (QID) | OROMUCOSAL | Status: DC
  Administered 2020-07-14 – 2020-07-16 (×7): 500000 [IU] via ORAL
  Filled 2020-07-14 (×8): qty 5

## 2020-07-14 MED ORDER — HYOSCYAMINE SULFATE 0.125 MG SL SUBL
0.2500 mg | SUBLINGUAL_TABLET | Freq: Once | SUBLINGUAL | Status: AC
  Administered 2020-07-14: 0.25 mg via SUBLINGUAL
  Filled 2020-07-14: qty 2

## 2020-07-14 MED ORDER — BUPRENORPHINE HCL-NALOXONE HCL 2-0.5 MG SL SUBL
2.0000 | SUBLINGUAL_TABLET | Freq: Three times a day (TID) | SUBLINGUAL | Status: DC
Start: 2020-07-14 — End: 2020-07-16
  Administered 2020-07-14 – 2020-07-16 (×6): 2 via SUBLINGUAL
  Filled 2020-07-14 (×3): qty 2
  Filled 2020-07-14: qty 1
  Filled 2020-07-14: qty 2
  Filled 2020-07-14: qty 1
  Filled 2020-07-14: qty 2

## 2020-07-14 MED ORDER — LIDOCAINE VISCOUS HCL 2 % MT SOLN
15.0000 mL | Freq: Once | OROMUCOSAL | Status: AC
  Administered 2020-07-14: 15 mL via ORAL
  Filled 2020-07-14: qty 15

## 2020-07-14 MED ORDER — AMLODIPINE BESYLATE 5 MG PO TABS
10.0000 mg | ORAL_TABLET | Freq: Every day | ORAL | Status: DC
  Administered 2020-07-14 – 2020-07-16 (×3): 10 mg via ORAL
  Filled 2020-07-14 (×3): qty 2
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
## 2020-07-14 NOTE — Progress Notes (Signed)
  Subjective:  Patient complains of lower sternal and epigastric pain.  She says pain medication helps but not significantly.  She has not experience nausea vomiting melena or rectal bleeding.  She is keeping liquids down.  Current Medications: Meds reviewed.   Objective: Blood pressure (!) 188/112, pulse 83, temperature 98.4 F (36.9 C), temperature source Oral, resp. rate 20, height _0  (1.651 m), weight 48.8 kg, SpO2 99 %. Patient is alert and in no acute distress. No neck masses or thyromegaly noted. Cardiac exam with regular rhythm normal S1 and S2. No murmur or gallop noted. Lungs are clear to auscultation. Abdomen is symmetrical.  Bowel sounds are normal.  She has mild midepigastric tenderness on deep palpation.  No organomegaly or masses. No LE edema or clubbing noted.  Labs/studies Results:  CBC Latest Ref Rng & Units 07/14/2020 07/13/2020 07/12/2020  WBC 4.0 - 10.5 K/uL 13.6(H) 16.9(H) 17.2(H)  Hemoglobin 12.0 - 15.0 g/dL 11.4(L) 12.1 12.4  Hematocrit 36.0 - 46.0 % 35.4(L) 38.1 37.5  Platelets 150 - 400 K/uL 232 216 231    CMP Latest Ref Rng & Units 07/14/2020 07/13/2020 07/12/2020  Glucose 70 - 99 mg/dL 324(H) 301(H) 235(H)  BUN 6 - 20 mg/dL 39(H) 34(H) 32(H)  Creatinine 0.44 - 1.00 mg/dL 2.49(H) 1.75(H) 1.53(H)  Sodium 135 - 145 mmol/L 133(L) 137 139  Potassium 3.5 - 5.1 mmol/L 4.0 4.5 3.4(L)  Chloride 98 - 111 mmol/L 100 104 103  CO2 22 - 32 mmol/L 19(L) 23 25  Calcium 8.9 - 10.3 mg/dL 8.5(L) 8.9 9.4  Total Protein 6.5 - 8.1 g/dL - - -  Total Bilirubin 0.3 - 1.2 mg/dL - - -  Alkaline Phos 38 - 126 U/L - - -  AST 15 - 41 U/L - - -  ALT 0 - 44 U/L - - -    Hepatic Function Latest Ref Rng & Units 07/12/2020 02/19/2019  Total Protein 6.5 - 8.1 g/dL 7.3 7.3  Albumin 3.5 - 5.0 g/dL 3.6 3.8  AST 15 - 41 U/L 20 29  ALT 0 - 44 U/L 20 24  Alk Phosphatase 38 - 126 U/L 153(H) 143(H)  Total Bilirubin 0.3 - 1.2 mg/dL 0.8 0.6    KOH prep positive for yeast. Gastric biopsy  pending.  Hemoglobin A1c 12.8 on 07/12/2020.  Assessment:  #1.  Melena suspected to be due to severe erosive reflux esophagitis documented on EGD by Dr. Abbey Chatters yesterday.  Less than 1 g drop in H&H.  #2.  Erosive/ulcerative reflux esophagitis.  Suspect underlying gastroparesis even though gastric emptying study in June 2020 was normal.  Earlier study in October 2014 suggested gastroparesis.  Patient is on IV pantoprazole oral sucralfate and metoclopramide.  #3.  Candida esophagitis.  KOH brushing is positive.  Therefore she will be treated with 10 days of oral Mycostatin.  #4.  Chronic kidney disease.  #5.  Type 1 diabetes.  Poorly controlled.  A1c 12.8   Recommendations  Advance diet to full liquids. Patient advised to stay upright for 2 hours after each meal and she should also keep head end of the bed elevated at all times. Mycostatin suspension 500,000 units swish and swallow 4 times a day.

## 2020-07-15 ENCOUNTER — Telehealth: Payer: Self-pay | Admitting: Gastroenterology

## 2020-07-15 LAB — CBC
HCT: 32 % — ABNORMAL LOW (ref 36.0–46.0)
Hemoglobin: 10.5 g/dL — ABNORMAL LOW (ref 12.0–15.0)
MCH: 29.7 pg (ref 26.0–34.0)
MCHC: 32.8 g/dL (ref 30.0–36.0)
MCV: 90.4 fL (ref 80.0–100.0)
Platelets: 216 10*3/uL (ref 150–400)
RBC: 3.54 MIL/uL — ABNORMAL LOW (ref 3.87–5.11)
RDW: 11.5 % (ref 11.5–15.5)
WBC: 11.3 10*3/uL — ABNORMAL HIGH (ref 4.0–10.5)
nRBC: 0 % (ref 0.0–0.2)

## 2020-07-15 LAB — RENAL FUNCTION PANEL
Albumin: 3 g/dL — ABNORMAL LOW (ref 3.5–5.0)
Anion gap: 10 (ref 5–15)
BUN: 41 mg/dL — ABNORMAL HIGH (ref 6–20)
CO2: 21 mmol/L — ABNORMAL LOW (ref 22–32)
Calcium: 8.5 mg/dL — ABNORMAL LOW (ref 8.9–10.3)
Chloride: 105 mmol/L (ref 98–111)
Creatinine, Ser: 2.52 mg/dL — ABNORMAL HIGH (ref 0.44–1.00)
GFR, Estimated: 26 mL/min — ABNORMAL LOW (ref >60)
Glucose, Bld: 210 mg/dL — ABNORMAL HIGH (ref 70–99)
Phosphorus: 3 mg/dL (ref 2.5–4.6)
Potassium: 3.4 mmol/L — ABNORMAL LOW (ref 3.5–5.1)
Sodium: 136 mmol/L (ref 135–145)

## 2020-07-15 LAB — GLUCOSE, CAPILLARY
Glucose-Capillary: 166 mg/dL — ABNORMAL HIGH (ref 70–99)
Glucose-Capillary: 179 mg/dL — ABNORMAL HIGH (ref 70–99)
Glucose-Capillary: 202 mg/dL — ABNORMAL HIGH (ref 70–99)
Glucose-Capillary: 207 mg/dL — ABNORMAL HIGH (ref 70–99)
Glucose-Capillary: 219 mg/dL — ABNORMAL HIGH (ref 70–99)
Glucose-Capillary: 245 mg/dL — ABNORMAL HIGH (ref 70–99)
Glucose-Capillary: 89 mg/dL (ref 70–99)

## 2020-07-15 MED ORDER — SODIUM CHLORIDE 0.9 % IV SOLN: INTRAVENOUS | Status: DC

## 2020-07-15 MED ORDER — FLUCONAZOLE 100 MG PO TABS
50.0000 mg | ORAL_TABLET | Freq: Every day | ORAL | Status: DC
  Administered 2020-07-16: 50 mg via ORAL
  Filled 2020-07-15 (×2): qty 0.5
  Filled 2020-07-15: qty 1

## 2020-07-15 MED ORDER — FLUCONAZOLE 100 MG PO TABS: 100.0000 mg | ORAL_TABLET | Freq: Every day | ORAL | Status: DC

## 2020-07-15 MED ORDER — FLUCONAZOLE 100 MG PO TABS
200.0000 mg | ORAL_TABLET | Freq: Once | ORAL | Status: AC
  Administered 2020-07-15: 200 mg via ORAL
  Filled 2020-07-15: qty 2

## 2020-07-15 MED ORDER — POTASSIUM CHLORIDE CRYS ER 20 MEQ PO TBCR
40.0000 meq | EXTENDED_RELEASE_TABLET | ORAL | Status: AC
  Administered 2020-07-15 (×2): 40 meq via ORAL
  Filled 2020-07-15 (×2): qty 2

## 2020-07-15 MED ORDER — FLUCONAZOLE 50 MG PO TABS
50.0000 mg | ORAL_TABLET | Freq: Every day | ORAL | Status: DC
  Filled 2020-07-15 (×2): qty 1

## 2020-07-15 MED ORDER — BUSPIRONE HCL 5 MG PO TABS
10.0000 mg | ORAL_TABLET | Freq: Three times a day (TID) | ORAL | Status: DC
  Administered 2020-07-15 – 2020-07-16 (×4): 10 mg via ORAL
  Filled 2020-07-15 (×4): qty 2

## 2020-07-15 NOTE — Telephone Encounter (Signed)
patient scheduled and nurse will let her know date

## 2020-07-15 NOTE — Progress Notes (Signed)
Chart reviewed. Patient not seen. Discussed with Dr. Mariea Clonts. No need for GI to see further during admission.   Recommend Diflucan course due to +candida esophagitis. BID PPI, and will arrange repeat EGD in 8 weeks. Diflucan 200 mg once today, followed by 100 mg daily for total of 14 days.   We will arrange outpatient visit.

## 2020-07-15 NOTE — Telephone Encounter (Signed)
   Stacey: please arrange outpatient follow-up with me or Dr. Marletta Lor. Patient needs to have repeat EGD in 8 weeks.
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
## 2020-07-15 NOTE — Plan of Care (Signed)

## 2020-07-16 LAB — BASIC METABOLIC PANEL
Anion gap: 7 (ref 5–15)
BUN: 30 mg/dL — ABNORMAL HIGH (ref 6–20)
CO2: 20 mmol/L — ABNORMAL LOW (ref 22–32)
Calcium: 8.4 mg/dL — ABNORMAL LOW (ref 8.9–10.3)
Chloride: 113 mmol/L — ABNORMAL HIGH (ref 98–111)
Creatinine, Ser: 2.13 mg/dL — ABNORMAL HIGH (ref 0.44–1.00)
GFR, Estimated: 31 mL/min — ABNORMAL LOW (ref >60)
Glucose, Bld: 107 mg/dL — ABNORMAL HIGH (ref 70–99)
Potassium: 4.8 mmol/L (ref 3.5–5.1)
Sodium: 140 mmol/L (ref 135–145)

## 2020-07-16 LAB — SURGICAL PATHOLOGY

## 2020-07-16 LAB — GLUCOSE, CAPILLARY
Glucose-Capillary: 120 mg/dL — ABNORMAL HIGH (ref 70–99)
Glucose-Capillary: 124 mg/dL — ABNORMAL HIGH (ref 70–99)
Glucose-Capillary: 181 mg/dL — ABNORMAL HIGH (ref 70–99)
Glucose-Capillary: 87 mg/dL (ref 70–99)

## 2020-07-16 MED ORDER — SODIUM BICARBONATE 650 MG PO TABS
1300.0000 mg | ORAL_TABLET | Freq: Once | ORAL | Status: AC
  Administered 2020-07-16: 1300 mg via ORAL
  Filled 2020-07-16: qty 2

## 2020-07-16 MED ORDER — PANTOPRAZOLE SODIUM 40 MG PO TBEC: 40.0000 mg | DELAYED_RELEASE_TABLET | Freq: Two times a day (BID) | ORAL | 5 refills | Status: DC

## 2020-07-16 MED ORDER — BUSPIRONE HCL 10 MG PO TABS
10.0000 mg | ORAL_TABLET | Freq: Three times a day (TID) | ORAL | 2 refills | Status: DC
Start: 2020-07-16 — End: 2020-12-20

## 2020-07-16 MED ORDER — SUCRALFATE 1 G PO TABS: 1.0000 g | ORAL_TABLET | Freq: Three times a day (TID) | ORAL | 0 refills | Status: DC

## 2020-07-16 MED ORDER — AMLODIPINE BESYLATE 10 MG PO TABS: 10.0000 mg | ORAL_TABLET | Freq: Every day | ORAL | 5 refills | Status: DC

## 2020-07-16 MED ORDER — METOCLOPRAMIDE HCL 5 MG PO TABS: 5.0000 mg | ORAL_TABLET | Freq: Three times a day (TID) | ORAL | 0 refills | Status: DC

## 2020-07-16 MED ORDER — FLUCONAZOLE 100 MG PO TABS: 100.0000 mg | ORAL_TABLET | Freq: Every day | ORAL | 0 refills | Status: DC

## 2020-07-16 MED ORDER — NYSTATIN 100000 UNIT/ML MT SUSP: 5.0000 mL | Freq: Four times a day (QID) | OROMUCOSAL | 0 refills | Status: AC

## 2020-07-16 NOTE — Discharge Instructions (Signed)
1)Avoid ibuprofen/Advil/Aleve/Motrin/Goody Powders/Naproxen/BC powders/Meloxicam/Diclofenac/Indomethacin and other Nonsteroidal anti-inflammatory medications as these will make you more likely to bleed and can cause stomach ulcers, can also cause Kidney problems.    2)You Need Repeat BMP on Thursday  07/18/20 at your appointment with the primary care team at [facility]  3) please take your new medications including nystatin, Protonix, Carafate and Diflucan as prescribed for the inflammation/infection of your food pipe and stomach
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
## 2020-07-16 NOTE — Plan of Care (Signed)

## 2020-07-17 ENCOUNTER — Encounter (HOSPITAL_COMMUNITY): Payer: Self-pay | Admitting: Internal Medicine

## 2020-09-10 ENCOUNTER — Ambulatory Visit: Payer: Medicaid Other | Admitting: Gastroenterology

## 2020-11-17 ENCOUNTER — Inpatient Hospital Stay (HOSPITAL_COMMUNITY)
Admission: EM | Admit: 2020-11-17 | Discharge: 2020-11-22 | DRG: 393 | Disposition: A | Discharge status: Home / Self Care | Payer: Self-pay | Attending: Family Medicine | Admitting: Family Medicine

## 2020-11-17 ENCOUNTER — Encounter (HOSPITAL_COMMUNITY): Payer: Self-pay

## 2020-11-17 ENCOUNTER — Other Ambulatory Visit: Payer: Self-pay

## 2020-11-17 DIAGNOSIS — F1290 Cannabis use, unspecified, uncomplicated: Secondary | ICD-10-CM | POA: Diagnosis present

## 2020-11-17 DIAGNOSIS — R1110 Vomiting, unspecified: Secondary | ICD-10-CM

## 2020-11-17 DIAGNOSIS — R112 Nausea with vomiting, unspecified: Principal | ICD-10-CM

## 2020-11-17 DIAGNOSIS — Z888 Allergy status to other drugs, medicaments and biological substances status: Secondary | ICD-10-CM

## 2020-11-17 DIAGNOSIS — F17210 Nicotine dependence, cigarettes, uncomplicated: Secondary | ICD-10-CM | POA: Diagnosis present

## 2020-11-17 DIAGNOSIS — Z91138 Patient's unintentional underdosing of medication regimen for other reason: Secondary | ICD-10-CM

## 2020-11-17 DIAGNOSIS — E1022 Type 1 diabetes mellitus with diabetic chronic kidney disease: Secondary | ICD-10-CM | POA: Diagnosis present

## 2020-11-17 DIAGNOSIS — Z885 Allergy status to narcotic agent status: Secondary | ICD-10-CM

## 2020-11-17 DIAGNOSIS — E1043 Type 1 diabetes mellitus with diabetic autonomic (poly)neuropathy: Secondary | ICD-10-CM | POA: Diagnosis present

## 2020-11-17 DIAGNOSIS — F1120 Opioid dependence, uncomplicated: Secondary | ICD-10-CM | POA: Diagnosis present

## 2020-11-17 DIAGNOSIS — R1115 Cyclical vomiting syndrome unrelated to migraine: Principal | ICD-10-CM | POA: Diagnosis present

## 2020-11-17 DIAGNOSIS — F419 Anxiety disorder, unspecified: Secondary | ICD-10-CM | POA: Diagnosis present

## 2020-11-17 DIAGNOSIS — R748 Abnormal levels of other serum enzymes: Secondary | ICD-10-CM | POA: Diagnosis present

## 2020-11-17 DIAGNOSIS — R1013 Epigastric pain: Secondary | ICD-10-CM

## 2020-11-17 DIAGNOSIS — I10 Essential (primary) hypertension: Secondary | ICD-10-CM | POA: Diagnosis present

## 2020-11-17 DIAGNOSIS — I129 Hypertensive chronic kidney disease with stage 1 through stage 4 chronic kidney disease, or unspecified chronic kidney disease: Secondary | ICD-10-CM | POA: Diagnosis present

## 2020-11-17 DIAGNOSIS — R109 Unspecified abdominal pain: Secondary | ICD-10-CM

## 2020-11-17 DIAGNOSIS — N189 Chronic kidney disease, unspecified: Secondary | ICD-10-CM | POA: Diagnosis present

## 2020-11-17 DIAGNOSIS — T471X6A Underdosing of other antacids and anti-gastric-secretion drugs, initial encounter: Secondary | ICD-10-CM | POA: Diagnosis present

## 2020-11-17 DIAGNOSIS — Z8719 Personal history of other diseases of the digestive system: Secondary | ICD-10-CM

## 2020-11-17 DIAGNOSIS — J45909 Unspecified asthma, uncomplicated: Secondary | ICD-10-CM | POA: Diagnosis present

## 2020-11-17 DIAGNOSIS — K529 Noninfective gastroenteritis and colitis, unspecified: Secondary | ICD-10-CM | POA: Diagnosis present

## 2020-11-17 DIAGNOSIS — N141 Nephropathy induced by other drugs, medicaments and biological substances: Secondary | ICD-10-CM | POA: Diagnosis present

## 2020-11-17 DIAGNOSIS — N179 Acute kidney failure, unspecified: Secondary | ICD-10-CM

## 2020-11-17 DIAGNOSIS — N182 Chronic kidney disease, stage 2 (mild): Secondary | ICD-10-CM | POA: Diagnosis present

## 2020-11-17 DIAGNOSIS — F32A Depression, unspecified: Secondary | ICD-10-CM | POA: Diagnosis present

## 2020-11-17 DIAGNOSIS — Z8711 Personal history of peptic ulcer disease: Secondary | ICD-10-CM

## 2020-11-17 DIAGNOSIS — E10649 Type 1 diabetes mellitus with hypoglycemia without coma: Secondary | ICD-10-CM | POA: Diagnosis not present

## 2020-11-17 DIAGNOSIS — E109 Type 1 diabetes mellitus without complications: Secondary | ICD-10-CM

## 2020-11-17 DIAGNOSIS — K259 Gastric ulcer, unspecified as acute or chronic, without hemorrhage or perforation: Secondary | ICD-10-CM | POA: Diagnosis present

## 2020-11-17 DIAGNOSIS — N170 Acute kidney failure with tubular necrosis: Secondary | ICD-10-CM | POA: Diagnosis not present

## 2020-11-17 DIAGNOSIS — R809 Proteinuria, unspecified: Secondary | ICD-10-CM | POA: Diagnosis present

## 2020-11-17 DIAGNOSIS — T508X5A Adverse effect of diagnostic agents, initial encounter: Secondary | ICD-10-CM | POA: Diagnosis present

## 2020-11-17 DIAGNOSIS — Z20822 Contact with and (suspected) exposure to covid-19: Secondary | ICD-10-CM | POA: Diagnosis present

## 2020-11-17 HISTORY — DX: Borderline personality disorder: F60.3

## 2020-11-17 HISTORY — DX: Opioid use, unspecified, uncomplicated: F11.90

## 2020-11-17 HISTORY — DX: Chronic kidney disease, unspecified: N18.9

## 2020-11-17 LAB — CBG MONITORING, ED: Glucose-Capillary: 263 mg/dL — ABNORMAL HIGH (ref 70–99)

## 2020-11-17 LAB — POC URINE PREG, ED: Preg Test, Ur: NEGATIVE

## 2020-11-17 MED ORDER — ONDANSETRON HCL 4 MG/2ML IJ SOLN
4.0000 mg | Freq: Once | INTRAMUSCULAR | Status: AC
  Administered 2020-11-17: 4 mg via INTRAVENOUS
  Filled 2020-11-17: qty 2

## 2020-11-17 NOTE — ED Notes (Signed)
Pt reports she took her Suboxone this morning but has been unable to keep anything down.

## 2020-11-17 NOTE — ED Triage Notes (Signed)
Pt. States they have been having abdominal pain with n/v since this morning.

## 2020-11-18 ENCOUNTER — Emergency Department (HOSPITAL_COMMUNITY): Payer: Self-pay

## 2020-11-18 ENCOUNTER — Encounter (HOSPITAL_COMMUNITY): Payer: Self-pay | Admitting: Family Medicine

## 2020-11-18 DIAGNOSIS — F418 Other specified anxiety disorders: Secondary | ICD-10-CM

## 2020-11-18 DIAGNOSIS — E109 Type 1 diabetes mellitus without complications: Secondary | ICD-10-CM

## 2020-11-18 DIAGNOSIS — R1110 Vomiting, unspecified: Secondary | ICD-10-CM | POA: Insufficient documentation

## 2020-11-18 DIAGNOSIS — R1115 Cyclical vomiting syndrome unrelated to migraine: Secondary | ICD-10-CM | POA: Diagnosis present

## 2020-11-18 DIAGNOSIS — N182 Chronic kidney disease, stage 2 (mild): Secondary | ICD-10-CM

## 2020-11-18 DIAGNOSIS — R109 Unspecified abdominal pain: Secondary | ICD-10-CM

## 2020-11-18 DIAGNOSIS — I10 Essential (primary) hypertension: Secondary | ICD-10-CM

## 2020-11-18 DIAGNOSIS — F1120 Opioid dependence, uncomplicated: Secondary | ICD-10-CM

## 2020-11-18 DIAGNOSIS — R112 Nausea with vomiting, unspecified: Secondary | ICD-10-CM | POA: Diagnosis present

## 2020-11-18 LAB — CBG MONITORING, ED
Glucose-Capillary: 208 mg/dL — ABNORMAL HIGH (ref 70–99)
Glucose-Capillary: 173 mg/dL — ABNORMAL HIGH (ref 70–99)
Glucose-Capillary: 111 mg/dL — ABNORMAL HIGH (ref 70–99)
Glucose-Capillary: 177 mg/dL — ABNORMAL HIGH (ref 70–99)

## 2020-11-18 LAB — CBC
HCT: 36.7 % (ref 36.0–46.0)
Hemoglobin: 12.8 g/dL (ref 12.0–15.0)
MCH: 30 pg (ref 26.0–34.0)
MCHC: 34.9 g/dL (ref 30.0–36.0)
MCV: 86.2 fL (ref 80.0–100.0)
Platelets: 206 10*3/uL (ref 150–400)
RBC: 4.26 MIL/uL (ref 3.87–5.11)
RDW: 11.7 % (ref 11.5–15.5)
WBC: 12 10*3/uL — ABNORMAL HIGH (ref 4.0–10.5)
nRBC: 0 % (ref 0.0–0.2)

## 2020-11-18 LAB — URINALYSIS, ROUTINE W REFLEX MICROSCOPIC
Bacteria, UA: NONE SEEN
Bilirubin Urine: NEGATIVE
Glucose, UA: >=500 mg/dL — AB
Hgb urine dipstick: NEGATIVE
Ketones, ur: 20 mg/dL — AB
Leukocytes,Ua: NEGATIVE
Nitrite: NEGATIVE
Protein, ur: >=300 mg/dL — AB
Specific Gravity, Urine: 1.018 (ref 1.005–1.030)
pH: 7 (ref 5.0–8.0)

## 2020-11-18 LAB — COMPREHENSIVE METABOLIC PANEL
ALT: 27 U/L (ref 0–44)
AST: 30 U/L (ref 15–41)
Albumin: 3.5 g/dL (ref 3.5–5.0)
Alkaline Phosphatase: 158 U/L — ABNORMAL HIGH (ref 38–126)
Anion gap: 8 (ref 5–15)
BUN: 20 mg/dL (ref 6–20)
CO2: 23 mmol/L (ref 22–32)
Calcium: 8.7 mg/dL — ABNORMAL LOW (ref 8.9–10.3)
Chloride: 101 mmol/L (ref 98–111)
Creatinine, Ser: 1.28 mg/dL — ABNORMAL HIGH (ref 0.44–1.00)
GFR, Estimated: 58 mL/min — ABNORMAL LOW (ref >60)
Glucose, Bld: 249 mg/dL — ABNORMAL HIGH (ref 70–99)
Potassium: 4.1 mmol/L (ref 3.5–5.1)
Sodium: 132 mmol/L — ABNORMAL LOW (ref 135–145)
Total Bilirubin: 0.5 mg/dL (ref 0.3–1.2)
Total Protein: 7.3 g/dL (ref 6.5–8.1)

## 2020-11-18 LAB — TYPE AND SCREEN
ABO/RH(D): O POS
Antibody Screen: NEGATIVE

## 2020-11-18 LAB — ABO/RH: ABO/RH(D): O POS

## 2020-11-18 LAB — SARS CORONAVIRUS 2 (TAT 6-24 HRS): SARS Coronavirus 2: NEGATIVE

## 2020-11-18 LAB — LIPASE, BLOOD: Lipase: 21 U/L (ref 11–51)

## 2020-11-18 MED ORDER — SODIUM CHLORIDE 0.9 % IV SOLN: INTRAVENOUS | Status: AC

## 2020-11-18 MED ORDER — INSULIN GLARGINE-YFGN 100 UNIT/ML ~~LOC~~ SOLN
10.0000 [IU] | Freq: Every day | SUBCUTANEOUS | Status: DC
  Administered 2020-11-18 – 2020-11-21 (×4): 10 [IU] via SUBCUTANEOUS
  Filled 2020-11-18 (×7): qty 0.1

## 2020-11-18 MED ORDER — DICYCLOMINE HCL 10 MG PO CAPS
10.0000 mg | ORAL_CAPSULE | Freq: Once | ORAL | Status: AC
  Administered 2020-11-18: 10 mg via ORAL
  Filled 2020-11-18: qty 1

## 2020-11-18 MED ORDER — FENTANYL CITRATE PF 50 MCG/ML IJ SOSY
50.0000 ug | PREFILLED_SYRINGE | Freq: Once | INTRAMUSCULAR | Status: AC
  Administered 2020-11-18: 50 ug via INTRAVENOUS
  Filled 2020-11-18: qty 1

## 2020-11-18 MED ORDER — PROCHLORPERAZINE EDISYLATE 10 MG/2ML IJ SOLN
10.0000 mg | Freq: Four times a day (QID) | INTRAMUSCULAR | Status: DC | PRN
  Administered 2020-11-18 – 2020-11-21 (×4): 10 mg via INTRAVENOUS
  Filled 2020-11-18 (×4): qty 2

## 2020-11-18 MED ORDER — SODIUM CHLORIDE 0.9 % IV SOLN
12.5000 mg | Freq: Four times a day (QID) | INTRAVENOUS | Status: DC | PRN
  Administered 2020-11-18 – 2020-11-19 (×3): 12.5 mg via INTRAVENOUS
  Filled 2020-11-18 (×2): qty 0.5

## 2020-11-18 MED ORDER — LACTATED RINGERS IV BOLUS
1000.0000 mL | Freq: Once | INTRAVENOUS | Status: AC
  Administered 2020-11-18: 1000 mL via INTRAVENOUS

## 2020-11-18 MED ORDER — PANTOPRAZOLE SODIUM 40 MG IV SOLR
40.0000 mg | Freq: Two times a day (BID) | INTRAVENOUS | Status: DC
  Administered 2020-11-18 – 2020-11-22 (×8): 40 mg via INTRAVENOUS
  Filled 2020-11-18 (×9): qty 40

## 2020-11-18 MED ORDER — LABETALOL HCL 5 MG/ML IV SOLN: 10.0000 mg | INTRAVENOUS | Status: DC | PRN

## 2020-11-18 MED ORDER — IOHEXOL 300 MG/ML  SOLN
80.0000 mL | Freq: Once | INTRAMUSCULAR | Status: AC | PRN
  Administered 2020-11-18: 80 mL via INTRAVENOUS

## 2020-11-18 MED ORDER — PROMETHAZINE HCL 25 MG/ML IJ SOLN
INTRAMUSCULAR | Status: AC
  Filled 2020-11-18: qty 1

## 2020-11-18 MED ORDER — INSULIN ASPART 100 UNIT/ML IJ SOLN
0.0000 [IU] | INTRAMUSCULAR | Status: DC
  Administered 2020-11-18: 2 [IU] via SUBCUTANEOUS
  Administered 2020-11-18: 3 [IU] via SUBCUTANEOUS
  Administered 2020-11-18: 2 [IU] via SUBCUTANEOUS
  Administered 2020-11-19: 5 [IU] via SUBCUTANEOUS
  Administered 2020-11-19: 9 [IU] via SUBCUTANEOUS
  Administered 2020-11-19: 1 [IU] via SUBCUTANEOUS
  Administered 2020-11-20 (×2): 2 [IU] via SUBCUTANEOUS
  Administered 2020-11-20: 3 [IU] via SUBCUTANEOUS
  Administered 2020-11-21: 2 [IU] via SUBCUTANEOUS
  Filled 2020-11-18 (×4): qty 1

## 2020-11-18 MED ORDER — HYDROMORPHONE HCL 1 MG/ML IJ SOLN
1.0000 mg | Freq: Once | INTRAMUSCULAR | Status: AC
  Administered 2020-11-18: 1 mg via INTRAVENOUS
  Filled 2020-11-18: qty 1

## 2020-11-18 MED ORDER — ENOXAPARIN SODIUM 40 MG/0.4ML IJ SOSY: 40.0000 mg | PREFILLED_SYRINGE | INTRAMUSCULAR | Status: DC

## 2020-11-18 MED ORDER — HYDROMORPHONE HCL 1 MG/ML IJ SOLN
0.5000 mg | INTRAMUSCULAR | Status: DC | PRN
  Administered 2020-11-18: 1 mg via INTRAVENOUS
  Filled 2020-11-18: qty 1

## 2020-11-18 MED ORDER — SUCRALFATE 1 GM/10ML PO SUSP
1.0000 g | Freq: Three times a day (TID) | ORAL | Status: DC | PRN
  Administered 2020-11-19: 1 g via ORAL
  Filled 2020-11-18: qty 10

## 2020-11-18 MED ORDER — BUPRENORPHINE HCL-NALOXONE HCL 8-2 MG SL SUBL
1.0000 | SUBLINGUAL_TABLET | Freq: Three times a day (TID) | SUBLINGUAL | Status: DC
  Administered 2020-11-18 – 2020-11-22 (×13): 1 via SUBLINGUAL
  Filled 2020-11-18 (×13): qty 1

## 2020-11-18 MED ORDER — SUMATRIPTAN SUCCINATE 6 MG/0.5ML ~~LOC~~ SOLN
6.0000 mg | Freq: Once | SUBCUTANEOUS | Status: AC
  Administered 2020-11-18: 6 mg via SUBCUTANEOUS
  Filled 2020-11-18: qty 0.5

## 2020-11-18 MED ORDER — PANTOPRAZOLE 80MG IVPB - SIMPLE MED
80.0000 mg | Freq: Once | INTRAVENOUS | Status: AC
  Administered 2020-11-18: 80 mg via INTRAVENOUS
  Filled 2020-11-18: qty 100

## 2020-11-18 MED ORDER — ENOXAPARIN SODIUM 40 MG/0.4ML IJ SOSY
40.0000 mg | PREFILLED_SYRINGE | INTRAMUSCULAR | Status: DC
  Filled 2020-11-18: qty 0.4

## 2020-11-18 MED ORDER — SODIUM CHLORIDE 0.9 % IV SOLN
12.5000 mg | Freq: Four times a day (QID) | INTRAVENOUS | Status: DC | PRN
  Administered 2020-11-18: 12.5 mg via INTRAVENOUS
  Filled 2020-11-18: qty 0.5

## 2020-11-18 MED ORDER — DIPHENHYDRAMINE HCL 50 MG/ML IJ SOLN
25.0000 mg | Freq: Three times a day (TID) | INTRAMUSCULAR | Status: DC | PRN
  Administered 2020-11-18 – 2020-11-21 (×3): 25 mg via INTRAVENOUS
  Filled 2020-11-18 (×3): qty 1

## 2020-11-18 NOTE — ED Notes (Signed)
Pt resting with eyes closed, quietly in hall at this time. Equal rise and fall of chest noted.

## 2020-11-18 NOTE — H&P (Signed)
History and Physical    Ann Moyer ZOX:[id number] DOB: 09/16/1990 DOA: 11/17/2020  [facility]   Patient coming from: Home   Chief Complaint: Abdominal pain, N/V   HPI: Ann Moyer is a 30 y.o. female with medical history significant for type 1 diabetes mellitus, hypertension, CKD II, anxiety, depression, and opiate dependence, now presenting to the emergency department with severe abdominal pain, nausea, and nonbloody vomiting.  Patient reports that her symptoms developed the morning of 11/17/2020 and she has been unable to tolerate any pills, liquids, or foods since then.  She denies any fevers, chills, dysuria, or flank pain.  She had not been experiencing any diarrhea until she got to the emergency department where she reports having 2 loose stools.  She had melena in April and was found to have severe erosive esophagitis with Candida on EGD, stopped NSAID use, completed Diflucan, and continues to take Protonix.  She denies any recent melena or hematochezia.  Denies hematemesis.  Reports that she has followed up with GI at [facility] approximately 2 months ago where she reports having a normal gastric emptying study.  [facility] Course: Upon arrival to the [facility], patient is found to be afebrile, saturating well on room air, mildly tachycardic, and hypertensive.  Chemistry panel notable for glucose 249, sodium 132, and creatinine 1.28.  CBC with leukocytosis to 12,000.  Lipase was normal.  CT the abdomen pelvis with mild fat stranding adjacent to the distal descending colon, improved from prior CT.  Patient was given IV Protonix, 2 L of IV fluids, Dilaudid, fentanyl, Zofran, Phenergan, and Bentyl in the emergency department.  Review of Systems:  All other systems reviewed and apart from HPI, are negative.  Past Medical History:  Diagnosis Date   Asthma    Diabetes mellitus without complication [facility](HCC)     Past Surgical History:  Procedure Laterality Date   BIOPSY  07/13/2020   Procedure: BIOPSY;   Surgeon: Lanelle Balarver, Charles K, DO;  Location: [facility];  Service: Endoscopy;;  gastric, esophageal   ESOPHAGOGASTRODUODENOSCOPY (EGD) WITH PROPOFOL N/A 07/13/2020   Procedure: ESOPHAGOGASTRODUODENOSCOPY (EGD) WITH PROPOFOL;  Surgeon: Lanelle Balarver, Charles K, DO;  Location: [facility];  Service: Endoscopy;  Laterality: N/A;   EYE SURGERY      Social History:   reports that she has been smoking. She has been smoking an average of .5 packs per day. She has never used smokeless tobacco. She reports that she does not currently use alcohol. She reports current drug use. Drug: Marijuana.  Allergies  Allergen Reactions   Amitriptyline Swelling and Other (See Comments)    Legs and feet    Gabapentin Swelling    swelling   Pregabalin Swelling    swelling   Tramadol Itching and Swelling    Swelling, itching Eye and face swelling    Haloperidol Other (See Comments)    Uncontrollable movements  "uncontrolled movement of my body, like tremors"; previously charted to cause nausea and vomiting.     Toradol [Ketorolac Tromethamine] Nausea Only   Ketorolac Itching   Metoclopramide Itching    Jittery and itchy Jittery and itchy     History reviewed. No pertinent family history.   Prior to Admission medications   Medication Sig Start Date End Date Taking? Authorizing Provider  acetaminophen (TYLENOL) 325 MG tablet Take 650 mg by mouth every 6 (six) hours as needed. 05/28/14   [provider]  albuterol (VENTOLIN HFA) 108 (90 Base) MCG/ACT inhaler Inhale 2 puffs into the lungs  every 4 (four) hours as needed for wheezing or shortness of breath. 01/26/13   [provider]  amLODipine (NORVASC) 10 MG tablet Take 1 tablet (10 mg total) by mouth daily. For BP 07/16/20   Emokpae, Courage, MD  amphetamine-dextroamphetamine (ADDERALL) 20 MG tablet Take 20 mg by mouth daily. 06/10/20   [provider]  buprenorphine-naloxone (SUBOXONE) 8-2 mg SUBL SL tablet Place 1 tablet under the  tongue See admin instructions. Dissolve i1/2 tablet by mouth 3 times a day 07/10/20   [provider]  busPIRone (BUSPAR) 10 MG tablet Take 1 tablet (10 mg total) by mouth 3 (three) times daily. 07/16/20   Shon Hale, MD  etonogestrel (NEXPLANON) 68 MG IMPL implant 68 mg by Subdermal route once. 04/03/20   [provider]  fluconazole (DIFLUCAN) 100 MG tablet Take 1 tablet (100 mg total) by mouth daily. 07/17/20   Shon Hale, MD  glucagon 1 MG injection Inject 1 mg into the vein once as needed. 08/12/10   [provider]  insulin aspart (NOVOLOG) 100 UNIT/ML FlexPen Inject 2-5 Units into the skin 2 (two) times daily before a meal. Sliding scale 04/03/20   [provider]  insulin glargine (LANTUS) 100 UNIT/ML Solostar Pen Inject 16 Units into the skin at bedtime. 02/08/20   [provider]  Melatonin 10 MG TABS Take 1 tablet by mouth daily.    [provider]  metoCLOPramide (REGLAN) 5 MG tablet Take 1 tablet (5 mg total) by mouth 3 (three) times daily before meals. 07/16/20 08/15/20  Shon Hale, MD  OLANZapine (ZYPREXA) 5 MG tablet Take 5 mg by mouth daily. 07/10/20 08/09/20  [provider]  ondansetron (ZOFRAN-ODT) 4 MG disintegrating tablet Take 4 mg by mouth every 8 (eight) hours as needed for nausea or vomiting. 07/10/20   [provider]  pantoprazole (PROTONIX) 40 MG tablet Take 1 tablet (40 mg total) by mouth 2 (two) times daily. 07/16/20 07/16/21  Shon Hale, MD  promethazine (PHENERGAN) 25 MG tablet Take 25 mg by mouth every 4 (four) hours as needed. 07/10/20   [provider]  sertraline (ZOLOFT) 50 MG tablet Take 1 tablet by mouth daily. 07/28/19 07/27/20  [provider]  sucralfate (CARAFATE) 1 g tablet Take 1 tablet (1 g total) by mouth 4 (four) times daily -  with meals and at bedtime for 14 days. 07/16/20 07/30/20  Shon Hale, MD  SUMAtriptan Succinate Refill 6 MG/0.5ML SOCT Inject into  the skin. 09/27/19 09/26/20  [provider]  tranexamic acid (LYSTEDA) 650 MG TABS tablet Take 1,300 mg by mouth 3 (three) times daily. 02/01/20   [provider]    Physical Exam: Vitals:   11/18/20 0300 11/18/20 0400 11/18/20 0440 11/18/20 0500  BP: (!) 180/106 (!) 183/111 (!) 186/106 (!) 174/98  Pulse: 89 (!) 105 99 93  Resp:  20 16 16   Temp:      TempSrc:      SpO2: 96% 98% 99% 96%  Weight:      Height:        Constitutional: NAD, appears uncomfortable, no diaphoresis  Eyes: PERTLA, lids and conjunctivae normal ENMT: Mucous membranes are moist. Posterior pharynx clear of any exudate or lesions.   Neck: supple, no masses  Respiratory: clear to auscultation bilaterally, no wheezing, no crackles. No accessory muscle use.  Cardiovascular: S1 & S2 heard, regular rate and rhythm. No extremity edema.  Abdomen: No distension, tender without rebound pain or guarding. Bowel sounds active.  Musculoskeletal: no clubbing / cyanosis. No joint deformity upper and lower extremities.   Skin: no significant rashes, lesions, ulcers. Warm, dry, well-perfused. Neurologic: CN 2-12 grossly intact. Moving all extremities.  Psychiatric: Alert and oriented to person, place, and situation. Calm and cooperative.    Labs and Imaging on Admission: I have personally reviewed following labs and imaging studies  CBC: Recent Labs  Lab 11/17/20 2330  WBC 12.0*  HGB 12.8  HCT 36.7  MCV 86.2  PLT 206   Basic Metabolic Panel: Recent Labs  Lab 11/17/20 2330  NA 132*  K 4.1  CL 101  CO2 23  GLUCOSE 249*  BUN 20  CREATININE 1.28*  CALCIUM 8.7*   GFR: Estimated Creatinine Clearance: 50.6 mL/min (A) (by C-G formula based on SCr of 1.28 mg/dL (H)). Liver Function Tests: Recent Labs  Lab 11/17/20 2330  AST 30  ALT 27  ALKPHOS 158*  BILITOT 0.5  PROT 7.3  ALBUMIN 3.5   Recent Labs  Lab 11/17/20 2330  LIPASE 21   No results for input(s): AMMONIA in the last 168  hours. Coagulation Profile: No results for input(s): INR, PROTIME in the last 168 hours. Cardiac Enzymes: No results for input(s): CKTOTAL, CKMB, CKMBINDEX, TROPONINI in the last 168 hours. BNP (last 3 results) No results for input(s): PROBNP in the last 8760 hours. HbA1C: No results for input(s): HGBA1C in the last 72 hours. CBG: Recent Labs  Lab 11/17/20 2301  GLUCAP 263*   Lipid Profile: No results for input(s): CHOL, HDL, LDLCALC, TRIG, CHOLHDL, LDLDIRECT in the last 72 hours. Thyroid Function Tests: No results for input(s): TSH, T4TOTAL, FREET4, T3FREE, THYROIDAB in the last 72 hours. Anemia Panel: No results for input(s): VITAMINB12, FOLATE, FERRITIN, TIBC, IRON, RETICCTPCT in the last 72 hours. Urine analysis:    Component Value Date/Time   COLORURINE YELLOW 11/17/2020 2310   APPEARANCEUR CLEAR 11/17/2020 2310   LABSPEC 1.018 11/17/2020 2310   PHURINE 7.0 11/17/2020 2310   GLUCOSEU >=500 (A) 11/17/2020 2310   HGBUR NEGATIVE 11/17/2020 2310   BILIRUBINUR NEGATIVE 11/17/2020 2310   KETONESUR 20 (A) 11/17/2020 2310   PROTEINUR >=300 (A) 11/17/2020 2310   NITRITE NEGATIVE 11/17/2020 2310   LEUKOCYTESUR NEGATIVE 11/17/2020 2310   Sepsis Labs: @LABRCNTIP (procalcitonin:4,lacticidven:4) )No results found for this or any previous visit (from the past 240 hour(s)).   Radiological Exams on Admission: CT ABDOMEN PELVIS W CONTRAST  Result Date: 11/18/2020 CLINICAL DATA:  Abdominal pain with nausea and vomiting EXAM: CT ABDOMEN AND PELVIS WITH CONTRAST TECHNIQUE: Multidetector CT imaging of the abdomen and pelvis was performed using the standard protocol following bolus administration of intravenous contrast. CONTRAST:  66mL OMNIPAQUE IOHEXOL 300 MG/ML  SOLN COMPARISON:  07/12/2020 FINDINGS: LOWER CHEST: Normal. HEPATOBILIARY: Normal hepatic contours. No intra- or extrahepatic biliary dilatation. The gallbladder is normal. PANCREAS: Normal pancreas. No ductal dilatation or  peripancreatic fluid collection. SPLEEN: Normal. ADRENALS/URINARY TRACT: The adrenal glands are normal. No hydronephrosis, nephroureterolithiasis or solid renal mass. The urinary bladder is normal for degree of distention STOMACH/BOWEL: There is no hiatal hernia. Normal duodenal course and caliber. No small bowel dilatation or inflammation. Mild fat stranding adjacent to the distal descending colon. Normal appendix. VASCULAR/LYMPHATIC: Normal course and caliber of the major abdominal vessels. No abdominal or pelvic lymphadenopathy. REPRODUCTIVE: Normal uterus. No adnexal mass. MUSCULOSKELETAL. No bony spinal canal stenosis or focal osseous abnormality. OTHER: None. IMPRESSION: Mild fat stranding adjacent to the distal descending colon, which may indicate mild colitis. Electronically Signed   By:  Deatra Robinson M.D.   On: 11/18/2020 02:32     Assessment/Plan   1. Intractable abdominal pain, N/V - Presents with recurrent severe abdominal pain and N/V since am of 11/17/20  - She describes sxs as similar to prior episodes, has been suspected of having gastroparesis but reports recent normal GES at [facility]  - No acute findings on CT  - Continue IVF hydration, monitor electrolytes, continue antiemetics and analgesia, advance diet when improving    2. Type I DM  - A1c was 12.8% in April 2022  - Continue CBGs and insulin    3. CKD II  - SCr is 1.28 on admission, appears slightly up from baseline in setting of N/V  - Continue IVF hydration until tolerating adequate oral intake, monitor   4. Hypertension  - BP elevated  in setting of pain and missed doses of antihypertensives d/t N/V   - Treat as-needed for now   5. Opiate dependence  - PDMP reviewed  - Continue Suboxone   6. Depression, anxiety -  - Resume home regimen once tolerating pills     DVT prophylaxis: Lovenox  Code Status: Full  Level of Care: Level of care: Telemetry Family Communication: None present  Disposition Plan Patient is  from: Home  Anticipated d/c is to: Home  Anticipated d/c date is: 11/19/20 Patient currently: Pending tolerance of adequate oral intake  Consults called: none  Admission status: Observation     Briscoe Deutscher, MD [facility]  11/18/2020, 5:23 AM

## 2020-11-18 NOTE — ED Notes (Signed)
Pt refusing IV fluids at this time.

## 2020-11-18 NOTE — Consult Note (Signed)
Referring Provider: Dr. Myrene Buddy Primary Care Physician:  [facility] Primary Gastroenterologist:  Previously seen at [facility]   Date of Admission: 11/17/20 Date of Consultation: 11/18/20  Reason for Consultation:  abdominal pain, N/V  HPI:  Ann Moyer is a 30 y.o. year old female with history of Type 1 diabetes, intermittent N/V, chronic marijuana use, history of opioid use disorder and followed by [facility] with Suboxone prescribed, recently inpatient April 2022 with abdominal pain, N/V and undergoing EGD at that time with LA Grade D erosive esophagitis without bleeding, gastritis s/p biopsy, normal duodenum. Found to have candida esophagitis at that time. Repeat EGD had been recommended. Suspected to have underlying gastroparesis in the past; however, multiple GES evaluations negative, most recently normal in June 2022 at [facility].   Presented this admission after waking up yesterday with acute onset of epigastric pain and associated N/V.Small episode of diarrhea. Pain described as diffusely. Ibuprofen every other day. Still uses marijuana two "bowls" a day. She is unsure if she has been taking a PPI routinely. No dysphagia. Believes she may have had blood-tinged emesis yesterday.   States she had done well after discharge from [facility] in April 2022 until this acute episode. Tearful and anxious today.   As of note, she did state she has had relief with hot showers when dealing with abdominal pain, N/V.   Past Medical History:  Diagnosis Date   Asthma    Diabetes mellitus without complication [facility])     Past Surgical History:  Procedure Laterality Date   BIOPSY  07/13/2020   Procedure: BIOPSY;  Surgeon: Eloise Harman, DO;  Location: [facility];  Service: Endoscopy;;  gastric, esophageal   ESOPHAGOGASTRODUODENOSCOPY (EGD) WITH PROPOFOL N/A 07/13/2020   LA Grade D erosive esophagitis without bleeding, gastritis s/p biopsy, normal duodenum. Found to  have candida esophagitis at that time   EYE SURGERY      Prior to Admission medications   Medication Sig Start Date End Date Taking? Authorizing Provider  acetaminophen (TYLENOL) 325 MG tablet Take 650 mg by mouth every 6 (six) hours as needed. 05/28/14   [provider]  albuterol (VENTOLIN HFA) 108 (90 Base) MCG/ACT inhaler Inhale 2 puffs into the lungs every 4 (four) hours as needed for wheezing or shortness of breath. 01/26/13   [provider]  amLODipine (NORVASC) 10 MG tablet Take 1 tablet (10 mg total) by mouth daily. For BP 07/16/20   Emokpae, Courage, MD  amphetamine-dextroamphetamine (ADDERALL) 20 MG tablet Take 20 mg by mouth daily. 06/10/20   [provider]  buprenorphine-naloxone (SUBOXONE) 8-2 mg SUBL SL tablet Place 1 tablet under the tongue See admin instructions. Dissolve i1/2 tablet by mouth 3 times a day 07/10/20   [provider]  busPIRone (BUSPAR) 10 MG tablet Take 1 tablet (10 mg total) by mouth 3 (three) times daily. 07/16/20   Roxan Hockey, MD  etonogestrel (NEXPLANON) 68 MG IMPL implant 68 mg by Subdermal route once. 04/03/20   [provider]  fluconazole (DIFLUCAN) 100 MG tablet Take 1 tablet (100 mg total) by mouth daily. 07/17/20   Roxan Hockey, MD  glucagon 1 MG injection Inject 1 mg into the vein once as needed. 08/12/10   [provider]  insulin aspart (NOVOLOG) 100 UNIT/ML FlexPen Inject 2-5 Units into the skin 2 (two) times daily before a meal. Sliding scale 04/03/20   [provider]  insulin glargine (LANTUS) 100 UNIT/ML Solostar Pen Inject 16 Units  into the skin at bedtime. 02/08/20   [provider]  Melatonin 10 MG TABS Take 1 tablet by mouth daily.    [provider]  metoCLOPramide (REGLAN) 5 MG tablet Take 1 tablet (5 mg total) by mouth 3 (three) times daily before meals. 07/16/20 08/15/20  Roxan Hockey, MD  OLANZapine (ZYPREXA) 5 MG tablet Take 5 mg by mouth daily. 07/10/20  08/09/20  [provider]  ondansetron (ZOFRAN-ODT) 4 MG disintegrating tablet Take 4 mg by mouth every 8 (eight) hours as needed for nausea or vomiting. 07/10/20   [provider]  pantoprazole (PROTONIX) 40 MG tablet Take 1 tablet (40 mg total) by mouth 2 (two) times daily. 07/16/20 07/16/21  Roxan Hockey, MD  promethazine (PHENERGAN) 25 MG tablet Take 25 mg by mouth every 4 (four) hours as needed. 07/10/20   [provider]  sertraline (ZOLOFT) 50 MG tablet Take 1 tablet by mouth daily. 07/28/19 07/27/20  [provider]  sucralfate (CARAFATE) 1 g tablet Take 1 tablet (1 g total) by mouth 4 (four) times daily -  with meals and at bedtime for 14 days. 07/16/20 07/30/20  Roxan Hockey, MD  SUMAtriptan Succinate Refill 6 MG/0.5ML SOCT Inject into the skin. 09/27/19 09/26/20  [provider]  tranexamic acid (LYSTEDA) 650 MG TABS tablet Take 1,300 mg by mouth 3 (three) times daily. 02/01/20   [provider]    Current Facility-Administered Medications  Medication Dose Route Frequency Provider Last Rate Last Admin   0.9 %  sodium chloride infusion   Intravenous Continuous Opyd, Ilene Qua, MD   Paused at 11/18/20 1532   buprenorphine-naloxone (SUBOXONE) 8-2 mg per SL tablet 1 tablet  1 tablet Sublingual TID Opyd, Ilene Qua, MD   1 tablet at 11/18/20 1516   diphenhydrAMINE (BENADRYL) injection 25 mg  25 mg Intravenous Q8H PRN Edwin Dada, MD   25 mg at 11/18/20 1232   enoxaparin (LOVENOX) injection 40 mg  40 mg Subcutaneous Q24H Opyd, Ilene Qua, MD       insulin aspart (novoLOG) injection 0-9 Units  0-9 Units Subcutaneous Q4H Opyd, Ilene Qua, MD   2 Units at 11/18/20 1232   insulin glargine-yfgn (SEMGLEE) injection 10 Units  10 Units Subcutaneous QHS Opyd, Ilene Qua, MD       labetalol (NORMODYNE) injection 10 mg  10 mg Intravenous Q4H PRN Opyd, Ilene Qua, MD       pantoprazole (PROTONIX) injection 40 mg  40 mg Intravenous Q12H Edwin Dada, MD   40 mg at 11/18/20 1232   prochlorperazine (COMPAZINE) injection 10 mg  10 mg Intravenous Q6H PRN Edwin Dada, MD   10 mg at 11/18/20 1516   promethazine (PHENERGAN) 12.5 mg in sodium chloride 0.9 % 50 mL IVPB  12.5 mg Intravenous Q6H PRN Opyd, Ilene Qua, MD   Stopped at 11/18/20 1107   sucralfate (CARAFATE) 1 GM/10ML suspension 1 g  1 g Oral TID PRN Edwin Dada, MD       Current Outpatient Medications  Medication Sig Dispense Refill   acetaminophen (TYLENOL) 325 MG tablet Take 650 mg by mouth every 6 (six) hours as needed.     albuterol (VENTOLIN HFA) 108 (90 Base) MCG/ACT inhaler Inhale 2 puffs into the lungs every 4 (four) hours as needed for wheezing or shortness of breath.     amLODipine (NORVASC) 10 MG tablet Take 1 tablet (10 mg total) by mouth daily. For BP 30 tablet 5   amphetamine-dextroamphetamine (  ADDERALL) 20 MG tablet Take 20 mg by mouth daily.     buprenorphine-naloxone (SUBOXONE) 8-2 mg SUBL SL tablet Place 1 tablet under the tongue See admin instructions. Dissolve i1/2 tablet by mouth 3 times a day     busPIRone (BUSPAR) 10 MG tablet Take 1 tablet (10 mg total) by mouth 3 (three) times daily. 90 tablet 2   etonogestrel (NEXPLANON) 68 MG IMPL implant 68 mg by Subdermal route once.     fluconazole (DIFLUCAN) 100 MG tablet Take 1 tablet (100 mg total) by mouth daily. 14 tablet 0   glucagon 1 MG injection Inject 1 mg into the vein once as needed.     insulin aspart (NOVOLOG) 100 UNIT/ML FlexPen Inject 2-5 Units into the skin 2 (two) times daily before a meal. Sliding scale     insulin glargine (LANTUS) 100 UNIT/ML Solostar Pen Inject 16 Units into the skin at bedtime.     Melatonin 10 MG TABS Take 1 tablet by mouth daily.     metoCLOPramide (REGLAN) 5 MG tablet Take 1 tablet (5 mg total) by mouth 3 (three) times daily before meals. 90 tablet 0   OLANZapine (ZYPREXA) 5 MG tablet Take 5 mg by mouth daily.     ondansetron (ZOFRAN-ODT) 4 MG  disintegrating tablet Take 4 mg by mouth every 8 (eight) hours as needed for nausea or vomiting.     pantoprazole (PROTONIX) 40 MG tablet Take 1 tablet (40 mg total) by mouth 2 (two) times daily. 60 tablet 5   promethazine (PHENERGAN) 25 MG tablet Take 25 mg by mouth every 4 (four) hours as needed.     sertraline (ZOLOFT) 50 MG tablet Take 1 tablet by mouth daily.     sucralfate (CARAFATE) 1 g tablet Take 1 tablet (1 g total) by mouth 4 (four) times daily -  with meals and at bedtime for 14 days. 56 tablet 0   SUMAtriptan Succinate Refill 6 MG/0.5ML SOCT Inject into the skin.     tranexamic acid (LYSTEDA) 650 MG TABS tablet Take 1,300 mg by mouth 3 (three) times daily.      Allergies as of 11/17/2020 - Review Complete 11/17/2020  Allergen Reaction Noted   Amitriptyline Swelling 07/12/2020   Gabapentin  02/19/2019   Haldol [haloperidol]  07/12/2020   Lyrica [pregabalin]  02/19/2019   Toradol [ketorolac tromethamine] Nausea Only 02/19/2019   Tramadol  02/19/2019    History reviewed. No pertinent family history.  Social History   Socioeconomic History   Marital status: Single    Spouse name: Not on file   Number of children: Not on file   Years of education: Not on file   Highest education level: Not on file  Occupational History   Not on file  Tobacco Use   Smoking status: Every Day    Packs/day: 0.50    Types: Cigarettes   Smokeless tobacco: Never  Substance and Sexual Activity   Alcohol use: Not Currently   Drug use: Yes    Types: Marijuana   Sexual activity: Not on file  Other Topics Concern   Not on file  Social History Narrative   Not on file   Social Determinants of Health   Financial Resource Strain: Not on file  Food Insecurity: Not on file  Transportation Needs: Not on file  Physical Activity: Not on file  Stress: Not on file  Social Connections: Not on file  Intimate Partner Violence: Not on file    Review of Systems: Gen: Denies  fever, chills, loss  of appetite, change in weight or weight loss CV: Denies chest pain, heart palpitations, syncope, edema  Resp: Denies shortness of breath with rest, cough, wheezing GI: see HPI GU : Denies urinary burning, urinary frequency, urinary incontinence.  MS: Denies joint pain,swelling, cramping Derm: Denies rash, itching, dry skin Psych: Denies depression, anxiety,confusion, or memory loss Heme: Denies bruising, bleeding, and enlarged lymph nodes.  Physical Exam: Vital signs in last 24 hours: Temp:  [98 F (36.7 C)-98.7 F (37.1 C)] 98.5 F (36.9 C) (08/15 1512) Pulse Rate:  [88-113] 88 (08/15 1512) Resp:  [11-24] 13 (08/15 1512) BP: (105-186)/(75-111) 105/75 (08/15 1512) SpO2:  [94 %-99 %] 99 % (08/15 1512) Weight:  [49.9 kg] 49.9 kg (08/14 2305)   General:   Alert,  anxious and tearful Head:  Normocephalic and atraumatic. Eyes:  Sclera clear, no icterus.   Conjunctiva pink. Ears:  Normal auditory acuity. Lungs:  Clear throughout to auscultation.    Heart:  S1 S2 present without murmurs Abdomen:  Soft, mild TTP RUQ and epigastric  and nondistended. No masses, hepatosplenomegaly or hernias noted. Normal bowel sounds, without guarding, and without rebound.   Rectal:  Deferred until time of colonoscopy.   Msk:  Symmetrical without gross deformities. Normal posture. Pulses:  Normal pulses noted. Extremities:  Without edema. Neurologic:  Alert and  oriented x4 Psych:  Alert and cooperative. Normal mood and affect.  Intake/Output from previous day: No intake/output data recorded. Intake/Output this shift: No intake/output data recorded.  Lab Results: Recent Labs    11/17/20 2330  WBC 12.0*  HGB 12.8  HCT 36.7  PLT 206   BMET Recent Labs    11/17/20 2330  NA 132*  K 4.1  CL 101  CO2 23  GLUCOSE 249*  BUN 20  CREATININE 1.28*  CALCIUM 8.7*   LFT Recent Labs    11/17/20 2330  PROT 7.3  ALBUMIN 3.5  AST 30  ALT 27  ALKPHOS 158*  BILITOT 0.5   Lab Results   Component Value Date   LIPASE 21 11/17/2020      Studies/Results: CT ABDOMEN PELVIS W CONTRAST  Result Date: 11/18/2020 CLINICAL DATA:  Abdominal pain with nausea and vomiting EXAM: CT ABDOMEN AND PELVIS WITH CONTRAST TECHNIQUE: Multidetector CT imaging of the abdomen and pelvis was performed using the standard protocol following bolus administration of intravenous contrast. CONTRAST:  57m OMNIPAQUE IOHEXOL 300 MG/ML  SOLN COMPARISON:  07/12/2020 FINDINGS: LOWER CHEST: Normal. HEPATOBILIARY: Normal hepatic contours. No intra- or extrahepatic biliary dilatation. The gallbladder is normal. PANCREAS: Normal pancreas. No ductal dilatation or peripancreatic fluid collection. SPLEEN: Normal. ADRENALS/URINARY TRACT: The adrenal glands are normal. No hydronephrosis, nephroureterolithiasis or solid renal mass. The urinary bladder is normal for degree of distention STOMACH/BOWEL: There is no hiatal hernia. Normal duodenal course and caliber. No small bowel dilatation or inflammation. Mild fat stranding adjacent to the distal descending colon. Normal appendix. VASCULAR/LYMPHATIC: Normal course and caliber of the major abdominal vessels. No abdominal or pelvic lymphadenopathy. REPRODUCTIVE: Normal uterus. No adnexal mass. MUSCULOSKELETAL. No bony spinal canal stenosis or focal osseous abnormality. OTHER: None. IMPRESSION: Mild fat stranding adjacent to the distal descending colon, which may indicate mild colitis. Electronically Signed   By: KUlyses JarredM.D.   On: 11/18/2020 02:32    Impression: 30year old female with history of Type 1 diabetes, intermittent N/V, chronic marijuana use, [facility], recently inpatient April 2022 with abdominal pain,  N/V and undergoing EGD at that time with LA Grade D erosive esophagitis without bleeding, gastritis s/p biopsy, normal duodenum. Found to have candida esophagitis at that time. Repeat EGD had been  recommended. Suspected to have underlying gastroparesis in the past; however, multiple GES evaluations negative, most recently normal in June 2022 at [facility]. Presenting now with recurrent acute onset diffuse abdominal pain, associated N/V, possible hematemesis per patient reported. CT abd/pelvis with mild fat stranding adjacent to distal descending colon. Mild leukocytosis with WBC count 12 on admission. Lipase normal.    Multifactorial etiology to presentation. Endorses daily marijuana use. Previously concern raised for cannabis hyperemesis syndrome and likely playing a role with symptom presentation. Known history of severe esophagitis and does not take PPI routinely and also endorses use of NSAIDs frequently. Plans for EGD on 11/19/20. Doubt biliary etiology.   Mildly isolated elevated alk phos: previous isolated elevation also noted. Recheck HFP in am.   Mild fat stranding adjacent to distal descending colon: non-specific. One episode of loose stool prior to presentation. Follow clinically for now.   Plan: Clear liquids now NPO after midnight EGD on 11/19/20: discussed risks and benefits with patient with stated understanding Needs to avoid marijuana Continue PPI BID Hold Lovenox am dosing for 8/16 Recheck HFP in am    Annitta Needs, PhD, ANP-BC [facility]     LOS: 0 days    11/18/2020, 3:43 PM

## 2020-11-18 NOTE — ED Notes (Signed)
Pt placed on cardiac monitor 

## 2020-11-18 NOTE — ED Notes (Signed)
Called Pharmacy to bring IV phenergan

## 2020-11-18 NOTE — Progress Notes (Signed)
[facility] PROGRESS NOTE    Ann Moyer  JXB:[id number] DOB: [birth year] DOA: 11/17/2020 P[facility]: [facility]      Brief Narrative:  Ann Moyer is a 30 y.o. F with T1DM, esophagitis in April, and cyclic vomiting who presents with nausea, vomiting and abdominal pain.  Patient developed nausea, vomiting, and diffuse abdominal pain shortly prior to admission.  This was similar in character to previous episodes.  In the ER, CT abdomen shoewd no fluid collection, small bowel obstruction, diverticulitis or pancreatitis.  There was a small segment of descending colon colitis.    She was admitted for intractable nausea and vomiting.         Assessment & Plan:  Cyclic vomiting syndrome, NOS Patient has a history of recurrent episodes of nausea, vomiting, and abdominal pain.  Review of outside chart shows that she has about 3-4 episodes per year over the last several years, sometimes requiring hospitalization for fluids and antiemetics.  Of note, she did undergo gastric emptying study twice in the last several years, both of which have been unremarkable.  Also of note she had an endoscopy again 4 months ago, that showed esophagitis, and she has been noncompliant with her pantoprazole since then.  - Start IV pantoprazole - Consult gastroenterology   Given the leading diagnostic consideration is cyclic vomiting, we will avoid opiates, as these are not indicated for CVS, CHS or gastroparesis.    A trial of Imitrex was given in the ER, did not have immediate effect after 2 hours  - Ondansetron PRN  - Compazine or Phenergan for refractory nausea - Continue IV fluids - Advance diet as tolerated    Esophagitis Has previous history last April.  At present she has no chest pain, epigastric pain per se, nor hematemesis or melena.   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   Difficult to place patient No   Patient admitted for cyclic vomiting.  Still very nauseated, unable to tolerated adequate PO intake.  GI consulted and will plan for EGD tomorrow.    Further dispo pending EGD and also clinical response to therapy    Level of care: Med-Surg       MDM: This is a no charge note.  For further details, please see H&Pby Dr. Antionette Char from earlier today.    DVT prophylaxis: enoxaparin (LOVENOX) injection 40 mg Start: 11/19/20 2200  Code Status: FULL          Subjective: Active nausea, continued abdominal pain  Objective: Vitals:   11/18/20 0600 11/18/20 0800 11/18/20 0856 11/18/20 1512  BP: 138/88 128/80 128/80 105/75  Pulse: 89 99 93 88  Resp: 18 11 15 13   Temp:    98.5 F (36.9 C)  TempSrc:    Oral  SpO2: 98% 99% 98% 99%  Weight:  Height:       No intake or output data in the 24 hours ending 11/18/20 2133 Filed Weights   11/17/20 2305  Weight: 49.9 kg    Examination: Patient was seen and evaluated.      Data Reviewed: I have personally reviewed following labs and imaging studies:  CBC: Recent Labs  Lab 11/17/20 2330  WBC 12.0*  HGB 12.8  HCT 36.7  MCV 86.2  PLT 206   Basic Metabolic Panel: Recent Labs  Lab 11/17/20 2330  NA 132*  K 4.1  CL 101  CO2 23  GLUCOSE 249*  BUN 20  CREATININE 1.28*  CALCIUM 8.7*   GFR: Estimated Creatinine Clearance: 50.6 mL/min (A) (by C-G formula based on SCr of 1.28 mg/dL (H)). Liver Function Tests: Recent Labs  Lab 11/17/20 2330  AST 30  ALT 27   ALKPHOS 158*  BILITOT 0.5  PROT 7.3  ALBUMIN 3.5   Recent Labs  Lab 11/17/20 2330  LIPASE 21   No results for input(s): AMMONIA in the last 168 hours. Coagulation Profile: No results for input(s): INR, PROTIME in the last 168 hours. Cardiac Enzymes: No results for input(s): CKTOTAL, CKMB, CKMBINDEX, TROPONINI in the last 168 hours. BNP (last 3 results) No results for input(s): PROBNP in the last 8760 hours. HbA1C: No results for input(s): HGBA1C in the last 72 hours. CBG: Recent Labs  Lab 11/17/20 2301 11/18/20 0726 11/18/20 1224 11/18/20 1510  GLUCAP 263* 208* 173* 111*   Lipid Profile: No results for input(s): CHOL, HDL, LDLCALC, TRIG, CHOLHDL, LDLDIRECT in the last 72 hours. Thyroid Function Tests: No results for input(s): TSH, T4TOTAL, FREET4, T3FREE, THYROIDAB in the last 72 hours. Anemia Panel: No results for input(s): VITAMINB12, FOLATE, FERRITIN, TIBC, IRON, RETICCTPCT in the last 72 hours. Urine analysis:    Component Value Date/Time   COLORURINE YELLOW 11/17/2020 2310   APPEARANCEUR CLEAR 11/17/2020 2310   LABSPEC 1.018 11/17/2020 2310   PHURINE 7.0 11/17/2020 2310   GLUCOSEU >=500 (A) 11/17/2020 2310   HGBUR NEGATIVE 11/17/2020 2310   BILIRUBINUR NEGATIVE 11/17/2020 2310   KETONESUR 20 (A) 11/17/2020 2310   PROTEINUR >=300 (A) 11/17/2020 2310   NITRITE NEGATIVE 11/17/2020 2310   LEUKOCYTESUR NEGATIVE 11/17/2020 2310   Sepsis Labs: @LABRCNTIP (procalcitonin:4,lacticacidven:4)  ) Recent Results (from the past 240 hour(s))  SARS CORONAVIRUS 2 (TAT 6-24 HRS) Nasopharyngeal Nasopharyngeal Swab     Status: None   Collection Time: 11/18/20  5:10 AM   Specimen: Nasopharyngeal Swab  Result Value Ref Range Status   SARS Coronavirus 2 NEGATIVE NEGATIVE Final    Comment: (NOTE) SARS-CoV-2 target nucleic acids are NOT DETECTED.  The SARS-CoV-2 RNA is generally detectable in upper and lower respiratory specimens during the acute phase of infection.  Negative results do not preclude SARS-CoV-2 infection, do not rule out co-infections with other pathogens, and should not be used as the sole basis for treatment or other patient management decisions. Negative results must be combined with clinical observations, patient history, and epidemiological information. The expected result is Negative.  Fact Sheet for Patients: 11/20/20  Fact Sheet for Healthcare Providers: HairSlick.no  This test is not yet approved or cleared by the quierodirigir.com FDA and  has been authorized for detection and/or diagnosis of SARS-CoV-2 by FDA under an Emergency Use Authorization (EUA). This EUA will remain  in effect (meaning this test can be used) for the duration of the COVID-19 declaration under Se ction 564(b)(1) of the Act, 21 U.S.C. section 360bbb-3(b)(1), unless  the authorization is terminated or revoked sooner.  Performed at [facility], [address] [place], [place] [address]          Radiology Studies: CT ABDOMEN PELVIS W CONTRAST  Result Date: 11/18/2020 CLINICAL DATA:  Abdominal pain with nausea and vomiting EXAM: CT ABDOMEN AND PELVIS WITH CONTRAST TECHNIQUE: Multidetector CT imaging of the abdomen and pelvis was performed using the standard protocol following bolus administration of intravenous contrast. CONTRAST:  76mL OMNIPAQUE IOHEXOL 300 MG/ML  SOLN COMPARISON:  07/12/2020 FINDINGS: LOWER CHEST: Normal. HEPATOBILIARY: Normal hepatic contours. No intra- or extrahepatic biliary dilatation. The gallbladder is normal. PANCREAS: Normal pancreas. No ductal dilatation or peripancreatic fluid collection. SPLEEN: Normal. ADRENALS/URINARY TRACT: The adrenal glands are normal. No hydronephrosis, nephroureterolithiasis or solid renal mass. The urinary bladder is normal for degree of distention STOMACH/BOWEL: There is no hiatal hernia. Normal duodenal course and caliber. No small  bowel dilatation or inflammation. Mild fat stranding adjacent to the distal descending colon. Normal appendix. VASCULAR/LYMPHATIC: Normal course and caliber of the major abdominal vessels. No abdominal or pelvic lymphadenopathy. REPRODUCTIVE: Normal uterus. No adnexal mass. MUSCULOSKELETAL. No bony spinal canal stenosis or focal osseous abnormality. OTHER: None. IMPRESSION: Mild fat stranding adjacent to the distal descending colon, which may indicate mild colitis. Electronically Signed   By: Deatra Robinson M.D.   On: 11/18/2020 02:32        Scheduled Meds:  buprenorphine-naloxone  1 tablet Sublingual TID   [START ON 11/19/2020] enoxaparin (LOVENOX) injection  40 mg Subcutaneous Q24H   insulin aspart  0-9 Units Subcutaneous Q4H   insulin glargine-yfgn  10 Units Subcutaneous QHS   pantoprazole (PROTONIX) IV  40 mg Intravenous Q12H   Continuous Infusions:  sodium chloride Stopped (11/18/20 1532)   promethazine (PHENERGAN) injection (IM or IVPB) Stopped (11/18/20 1107)     LOS: 0 days    Time spent: 45 minutes    Alberteen Sam, MD [facility] 11/18/2020, 9:33 PM     Please page though AMION or Epic secure chat:  For [employer], Higher education careers adviser

## 2020-11-18 NOTE — ED Provider Notes (Signed)
[facility] EMERGENCY DEPARTMENT Provider Note   CSN: [id number] Arrival date & time: 11/17/20  2225     History Chief Complaint  Patient presents with   Abdominal Pain    Ann Moyer is a 30 y.o. female.  30 year old female who presents to the emerged from today with abdominal pain and vomiting.  Patient states that started earlier today.  She states she has a history of ulcers and this feels similar to that.  She states that abdominal pain is in her low abdomen without 1 specific spot that is worse than others.  She states compliance with her medications as directed.  No fevers.  No diarrhea.  No blood in her vomit.  No sick contacts or suspicious intake.   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   Opioid use with opioid-induced disorder (HCC) 08/24/2013   Diabetic neuropathy (HCC) 06/02/2012   Diabetic gastroparesis (HCC) 09/10/2011   Mild intermittent asthma, uncomplicated 04/22/2011    Past Surgical History:  Procedure Laterality Date   BIOPSY  07/13/2020   Procedure: BIOPSY;  Surgeon: Lanelle Balarver, Charles K, DO;  Location: [facility];  Service: Endoscopy;;  gastric, esophageal   ESOPHAGOGASTRODUODENOSCOPY (EGD) WITH PROPOFOL N/A 07/13/2020   Procedure: ESOPHAGOGASTRODUODENOSCOPY (EGD) WITH PROPOFOL;  Surgeon: Lanelle Balarver, Charles K, DO;  Location: [facility];  Service: Endoscopy;  Laterality: N/A;   EYE SURGERY       OB History   No obstetric history on file.     History reviewed. No pertinent  family history.  Social History   Tobacco Use   Smoking status: Every Day    Packs/day: 0.50    Types: Cigarettes   Smokeless tobacco: Never  Substance Use Topics   Alcohol use: Not Currently   Drug use: Yes    Types: Marijuana    Home Medications Prior to Admission medications   Medication Sig Start Date End Date Taking? Authorizing Provider  acetaminophen (TYLENOL) 325 MG tablet Take 650 mg by mouth every 6 (six) hours as needed. 05/28/14   [provider]  albuterol (VENTOLIN HFA) 108 (90 Base) MCG/ACT inhaler Inhale 2 puffs into the lungs every 4 (four) hours as needed for wheezing or shortness of breath. 01/26/13   [provider]  amLODipine (NORVASC) 10 MG tablet Take 1 tablet (10 mg total) by mouth daily. For BP 07/16/20   Emokpae, Courage, MD  amphetamine-dextroamphetamine (ADDERALL) 20 MG tablet Take 20 mg by mouth daily. 06/10/20   [provider]  buprenorphine-naloxone (SUBOXONE) 8-2 mg SUBL SL tablet Place 1 tablet under the tongue See admin instructions. Dissolve i1/2 tablet by mouth 3 times a day 07/10/20   [provider]  busPIRone (BUSPAR) 10 MG tablet Take 1 tablet (10 mg total) by mouth 3 (three) times daily. 07/16/20   Shon HaleEmokpae, Courage, MD  etonogestrel (NEXPLANON) 68 MG IMPL implant 68 mg by Subdermal route once. 04/03/20   [provider]  fluconazole (DIFLUCAN) 100 MG tablet Take 1 tablet (100 mg total)  by mouth daily. 07/17/20   Shon Hale, MD  glucagon 1 MG injection Inject 1 mg into the vein once as needed. 08/12/10   [provider]  insulin aspart (NOVOLOG) 100 UNIT/ML FlexPen Inject 2-5 Units into the skin 2 (two) times daily before a meal. Sliding scale 04/03/20   [provider]  insulin glargine (LANTUS) 100 UNIT/ML Solostar Pen Inject 16 Units into the skin at bedtime. 02/08/20   [provider]  Melatonin 10 MG TABS Take 1 tablet by mouth daily.    [provider]   metoCLOPramide (REGLAN) 5 MG tablet Take 1 tablet (5 mg total) by mouth 3 (three) times daily before meals. 07/16/20 08/15/20  Shon Hale, MD  OLANZapine (ZYPREXA) 5 MG tablet Take 5 mg by mouth daily. 07/10/20 08/09/20  [provider]  ondansetron (ZOFRAN-ODT) 4 MG disintegrating tablet Take 4 mg by mouth every 8 (eight) hours as needed for nausea or vomiting. 07/10/20   [provider]  pantoprazole (PROTONIX) 40 MG tablet Take 1 tablet (40 mg total) by mouth 2 (two) times daily. 07/16/20 07/16/21  Shon Hale, MD  promethazine (PHENERGAN) 25 MG tablet Take 25 mg by mouth every 4 (four) hours as needed. 07/10/20   [provider]  sertraline (ZOLOFT) 50 MG tablet Take 1 tablet by mouth daily. 07/28/19 07/27/20  [provider]  sucralfate (CARAFATE) 1 g tablet Take 1 tablet (1 g total) by mouth 4 (four) times daily -  with meals and at bedtime for 14 days. 07/16/20 07/30/20  Shon Hale, MD  SUMAtriptan Succinate Refill 6 MG/0.5ML SOCT Inject into the skin. 09/27/19 09/26/20  [provider]  tranexamic acid (LYSTEDA) 650 MG TABS tablet Take 1,300 mg by mouth 3 (three) times daily. 02/01/20   [provider]    Allergies    Amitriptyline, Gabapentin, Pregabalin, Tramadol, Haloperidol, Toradol [ketorolac tromethamine], Ketorolac, and Metoclopramide  Review of Systems   Review of Systems  Gastrointestinal:  Positive for abdominal pain.  All other systems reviewed and are negative.  Physical Exam Updated Vital Signs BP (!) 176/97   Pulse 91   Temp 98 F (36.7 C) (Oral)   Resp 20   Ht 5\' 5"  (1.651 m)   Wt 49.9 kg   SpO2 96%   BMI 18.30 kg/m   Physical Exam Vitals and nursing note reviewed.  Constitutional:      Appearance: She is well-developed.  HENT:     Head: Normocephalic and atraumatic.     Mouth/Throat:     Mouth: Mucous membranes are moist.     Pharynx: Oropharynx is clear.  Eyes:     Pupils: Pupils are equal,  round, and reactive to light.  Cardiovascular:     Rate and Rhythm: Normal rate and regular rhythm.  Pulmonary:     Effort: No respiratory distress.     Breath sounds: No stridor.  Abdominal:     General: Abdomen is flat. There is no distension.     Tenderness: There is no abdominal tenderness.  Musculoskeletal:        General: No swelling or tenderness. Normal range of motion.     Cervical back: Normal range of motion.  Skin:    General: Skin is warm and dry.  Neurological:     General: No focal deficit present.     Mental Status: She is alert.    Ann Results / Procedures / Treatments   Labs (all labs ordered are listed, but only abnormal results  are displayed) Labs Reviewed  COMPREHENSIVE METABOLIC PANEL - Abnormal; Notable for the following components:      Result Value   Sodium 132 (*)    Glucose, Bld 249 (*)    Creatinine, Ser 1.28 (*)    Calcium 8.7 (*)    Alkaline Phosphatase 158 (*)    GFR, Estimated 58 (*)    All other components within normal limits  CBC - Abnormal; Notable for the following components:   WBC 12.0 (*)    All other components within normal limits  URINALYSIS, ROUTINE W REFLEX MICROSCOPIC - Abnormal; Notable for the following components:   Glucose, UA >=500 (*)    Ketones, ur 20 (*)    Protein, ur >=300 (*)    All other components within normal limits  CBG MONITORING, Ann - Abnormal; Notable for the following components:   Glucose-Capillary 263 (*)    All other components within normal limits  SARS CORONAVIRUS 2 (TAT 6-24 HRS)  LIPASE, BLOOD  POC URINE PREG, Ann  TYPE AND SCREEN  ABO/RH    EKG None  Radiology CT ABDOMEN PELVIS W CONTRAST  Result Date: 11/18/2020 CLINICAL DATA:  Abdominal pain with nausea and vomiting EXAM: CT ABDOMEN AND PELVIS WITH CONTRAST TECHNIQUE: Multidetector CT imaging of the abdomen and pelvis was performed using the standard protocol following bolus administration of intravenous contrast. CONTRAST:  16mL  OMNIPAQUE IOHEXOL 300 MG/ML  SOLN COMPARISON:  07/12/2020 FINDINGS: LOWER CHEST: Normal. HEPATOBILIARY: Normal hepatic contours. No intra- or extrahepatic biliary dilatation. The gallbladder is normal. PANCREAS: Normal pancreas. No ductal dilatation or peripancreatic fluid collection. SPLEEN: Normal. ADRENALS/URINARY TRACT: The adrenal glands are normal. No hydronephrosis, nephroureterolithiasis or solid renal mass. The urinary bladder is normal for degree of distention STOMACH/BOWEL: There is no hiatal hernia. Normal duodenal course and caliber. No small bowel dilatation or inflammation. Mild fat stranding adjacent to the distal descending colon. Normal appendix. VASCULAR/LYMPHATIC: Normal course and caliber of the major abdominal vessels. No abdominal or pelvic lymphadenopathy. REPRODUCTIVE: Normal uterus. No adnexal mass. MUSCULOSKELETAL. No bony spinal canal stenosis or focal osseous abnormality. OTHER: None. IMPRESSION: Mild fat stranding adjacent to the distal descending colon, which may indicate mild colitis. Electronically Signed   By: Deatra Robinson M.D.   On: 11/18/2020 02:32    Procedures Procedures   Medications Ordered in Ann Medications  HYDROmorphone (DILAUDID) injection 0.5-1 mg (has no administration in time range)  promethazine (PHENERGAN) 12.5 mg in sodium chloride 0.9 % 50 mL IVPB (has no administration in time range)  0.9 %  sodium chloride infusion (has no administration in time range)  insulin aspart (novoLOG) injection 0-9 Units (0 Units Subcutaneous Not Given 11/18/20 0534)  insulin glargine-yfgn (SEMGLEE) injection 10 Units (has no administration in time range)  enoxaparin (LOVENOX) injection 40 mg (has no administration in time range)  labetalol (NORMODYNE) injection 10 mg (has no administration in time range)  buprenorphine-naloxone (SUBOXONE) 8-2 mg per SL tablet 1 tablet (has no administration in time range)  ondansetron (ZOFRAN) injection 4 mg (4 mg Intravenous Given  11/17/20 2330)  lactated ringers bolus 1,000 mL (0 mLs Intravenous Stopped 11/18/20 0232)  dicyclomine (BENTYL) capsule 10 mg (10 mg Oral Given 11/18/20 0015)  fentaNYL (SUBLIMAZE) injection 50 mcg (50 mcg Intravenous Given 11/18/20 0020)  HYDROmorphone (DILAUDID) injection 1 mg (1 mg Intravenous Given 11/18/20 0117)  iohexol (OMNIPAQUE) 300 MG/ML solution 80 mL (80 mLs Intravenous Contrast Given 11/18/20 0218)  pantoprazole (PROTONIX) 80 mg /NS 100 mL IVPB (0 mg  Intravenous Stopped 11/18/20 0428)  HYDROmorphone (DILAUDID) injection 1 mg (1 mg Intravenous Given 11/18/20 0324)  lactated ringers bolus 1,000 mL (0 mLs Intravenous Stopped 11/18/20 0431)    Ann Course  I have reviewed the triage vital signs and the nursing notes.  Pertinent labs & imaging results that were available during my care of the patient were reviewed by me and considered in my medical decision making (see chart for details).    MDM Rules/Calculators/A&P                           Not able to control the amount of pain sh3e is having. Only mid colitis on ct scan. Labs otherwise ok. States it feels like previous ulcer, protonix given. Persistent vomiting as well. Discussed with medicien for admission.   Final Clinical Impression(s) / Ann Diagnoses Final diagnoses:  Intractable vomiting with nausea, unspecified vomiting type  Epigastric pain    Rx / DC Orders Ann Discharge Orders     None        Arturo Freundlich, Barbara Cower, MD 11/18/20 [phone]

## 2020-11-18 NOTE — ED Notes (Signed)
Pt frustrated about being pulled to hallway bed. Attempted to make pt comfortable. Contacted MD about pt needing to speak with him. Nursing Director also discussed plan of care for pt.

## 2020-11-19 ENCOUNTER — Encounter (HOSPITAL_COMMUNITY): Payer: Self-pay | Admitting: Family Medicine

## 2020-11-19 ENCOUNTER — Encounter (HOSPITAL_COMMUNITY): Payer: Self-pay | Admitting: Anesthesiology

## 2020-11-19 DIAGNOSIS — R1110 Vomiting, unspecified: Secondary | ICD-10-CM

## 2020-11-19 DIAGNOSIS — R1013 Epigastric pain: Secondary | ICD-10-CM

## 2020-11-19 DIAGNOSIS — R1115 Cyclical vomiting syndrome unrelated to migraine: Principal | ICD-10-CM

## 2020-11-19 LAB — COMPREHENSIVE METABOLIC PANEL
ALT: 20 U/L (ref 0–44)
AST: 25 U/L (ref 15–41)
Albumin: 3 g/dL — ABNORMAL LOW (ref 3.5–5.0)
Alkaline Phosphatase: 119 U/L (ref 38–126)
Anion gap: 8 (ref 5–15)
BUN: 31 mg/dL — ABNORMAL HIGH (ref 6–20)
CO2: 26 mmol/L (ref 22–32)
Calcium: 8.8 mg/dL — ABNORMAL LOW (ref 8.9–10.3)
Chloride: 101 mmol/L (ref 98–111)
Creatinine, Ser: 2.84 mg/dL — ABNORMAL HIGH (ref 0.44–1.00)
GFR, Estimated: 22 mL/min — ABNORMAL LOW (ref >60)
Glucose, Bld: 120 mg/dL — ABNORMAL HIGH (ref 70–99)
Potassium: 3.5 mmol/L (ref 3.5–5.1)
Sodium: 135 mmol/L (ref 135–145)
Total Bilirubin: 0.4 mg/dL (ref 0.3–1.2)
Total Protein: 5.7 g/dL — ABNORMAL LOW (ref 6.5–8.1)

## 2020-11-19 LAB — CBC
HCT: 32.8 % — ABNORMAL LOW (ref 36.0–46.0)
Hemoglobin: 11 g/dL — ABNORMAL LOW (ref 12.0–15.0)
MCH: 30.2 pg (ref 26.0–34.0)
MCHC: 33.5 g/dL (ref 30.0–36.0)
MCV: 90.1 fL (ref 80.0–100.0)
Platelets: 210 10*3/uL (ref 150–400)
RBC: 3.64 MIL/uL — ABNORMAL LOW (ref 3.87–5.11)
RDW: 12 % (ref 11.5–15.5)
WBC: 9.5 10*3/uL (ref 4.0–10.5)
nRBC: 0 % (ref 0.0–0.2)

## 2020-11-19 LAB — CBG MONITORING, ED
Glucose-Capillary: 118 mg/dL — ABNORMAL HIGH (ref 70–99)
Glucose-Capillary: 121 mg/dL — ABNORMAL HIGH (ref 70–99)
Glucose-Capillary: 57 mg/dL — ABNORMAL LOW (ref 70–99)
Glucose-Capillary: 89 mg/dL (ref 70–99)

## 2020-11-19 LAB — MAGNESIUM: Magnesium: 1.8 mg/dL (ref 1.7–2.4)

## 2020-11-19 LAB — GLUCOSE, CAPILLARY
Glucose-Capillary: 106 mg/dL — ABNORMAL HIGH (ref 70–99)
Glucose-Capillary: 267 mg/dL — ABNORMAL HIGH (ref 70–99)
Glucose-Capillary: 352 mg/dL — ABNORMAL HIGH (ref 70–99)
Glucose-Capillary: 78 mg/dL (ref 70–99)
Glucose-Capillary: 97 mg/dL (ref 70–99)

## 2020-11-19 MED ORDER — PROMETHAZINE HCL 25 MG/ML IJ SOLN
INTRAMUSCULAR | Status: AC
  Filled 2020-11-19: qty 1

## 2020-11-19 MED ORDER — DEXTROSE 50 % IV SOLN
1.0000 | Freq: Once | INTRAVENOUS | Status: AC
  Administered 2020-11-19: 50 mL via INTRAVENOUS
  Filled 2020-11-19: qty 50

## 2020-11-19 MED ORDER — SODIUM CHLORIDE 0.9 % IV SOLN: INTRAVENOUS | Status: DC

## 2020-11-19 MED ORDER — DEXTROSE-NACL 5-0.9 % IV SOLN: INTRAVENOUS | Status: DC

## 2020-11-19 MED ORDER — ENOXAPARIN SODIUM 30 MG/0.3ML IJ SOSY
30.0000 mg | PREFILLED_SYRINGE | INTRAMUSCULAR | Status: DC
  Filled 2020-11-19: qty 0.3

## 2020-11-19 NOTE — Progress Notes (Signed)
Inpatient Diabetes Program Recommendations  AACE/ADA: New Consensus Statement on Inpatient Glycemic Control   Target Ranges:  Prepandial:   less than 140 mg/dL      Peak postprandial:   less than 180 mg/dL (1-2 hours)      Critically ill patients:  140 - 180 mg/dL   Results for CANDID, BOVEY (MRN [id number]) as of 11/19/2020 08:06  Ref. Range 11/19/2020 00:44 11/19/2020 02:59 11/19/2020 04:40 11/19/2020 06:44 11/19/2020 07:56  Glucose-Capillary Latest Ref Range: 70 - 99 mg/dL 379 (H)  Novolog 1 units 57 (L) 118 (H) 89 78  Results for MOANI, WEIPERT (MRN [id number]) as of 11/19/2020 08:06  Ref. Range 11/18/2020 07:26 11/18/2020 12:24 11/18/2020 15:10 11/18/2020 22:13  Glucose-Capillary Latest Ref Range: 70 - 99 mg/dL 299 (H)  Novolog 3 units 173 (H)  Novolog 2 units 111 (H) 177 (H)  Novolog 2 units  Semglee 10 units   Review of Glycemic Control  Diabetes history: DM1 (makes NO insulin; requires basal, correction, and carb coverage insulin) Outpatient Diabetes medications: Lantus 16 units QHS, Novolog 2-5 units BID Current orders for Inpatient glycemic control: Semglee 10 units QHS, Novolog 0-9 units Q4H  Inpatient Diabetes Program Recommendations:    Insulin: Anticipate hypoglycemia (57 mg/dl at 2:42 am today) likely due to Novolog correction. Please consider decreasing Novolog correction to 0-6 units Q4H and once diet is resumed, please change frequency to AC&HS.  Thanks, Orlando Penner, RN, MSN, CDE Diabetes Coordinator Inpatient Diabetes Program [phone] (Team Pager from 8am to 5pm)

## 2020-11-19 NOTE — Progress Notes (Signed)
Advised pt that her diet was changed to clear liquid and asked what she would like. Pt stated, "No, it's supposed to be full liquids. Dammit, I hate being lied to. She just told me full liquids." I advised pt that I would clarify with MD. Pt continues to yell and curse at staff, slapping bed and bedside table and IV pole. Pt yells, "I'm fucking hungry and I'm in pain. I'm tired of being treated bad just because I let y'all look at my other hospital records. That's why they won't give me no more pain medicine." Again advised pt that I had messaged MD to ask regarding pain medication, diet order, and asked MD to come speak with her personally. Advised pt that her behavior was inappropriate and yelling and cursing at staff was not going to be tolerated. Pt states, "Well, doing nothing ain't getting me nowhere. I'll just leave if I ain't going to be treated right!". Advised pt that it is within her rights to leave, but if she did it would be considered against medical advice. Pt states, "Oh, its funny how all of a sudden its against medical advice when all I want is to be treated right!". Pt asked to speak with patient representative, pt rep paged. MD returned page and advises that diet is now full liquid, pt notified of change. Still upset that she is not receiving pain medications. Pt has received her oral suboxone as previously ordered.

## 2020-11-19 NOTE — Progress Notes (Addendum)
Subjective: Feeling a little better today.  Nausea/vomiting resolved.  Couple episodes of emesis yesterday contained blood.  Continues with abdominal pain, primarily epigastric, but also with generalized abdominal discomfort.  To a liquid bowel movement yesterday in the ER, no BM today.  Denies BRBPR or melena.  Reports taking 4 ibuprofen 4 days a week for quite some time due to teeth pain.  No alcohol.  Marijuana use daily.   Requesting to have more than clear liquids today as we are unable to proceed with EGD per anesthesia due to AKI.  Objective: Vital signs in last 24 hours: Temp:  [98.3 F (36.8 C)-98.5 F (36.9 C)] 98.3 F (36.8 C) (08/16 0749) Pulse Rate:  [77-89] 89 (08/16 0749) Resp:  [13-18] 18 (08/16 0749) BP: (102-129)/(54-87) 129/87 (08/16 0749) SpO2:  [99 %-100 %] 100 % (08/16 0749)   General:   Alert and oriented, pleasant, no acute distress. Head:  Normocephalic and atraumatic. Abdomen:  Bowel sounds present, soft, non-distended.  Moderate TTP in the epigastric area extending across the upper abdomen.  Mild TTP across lower abdomen.  No HSM or hernias noted. No rebound or guarding. No masses appreciated  Msk:  Symmetrical without gross deformities. Normal posture. Extremities:  Without edema. Neurologic:  Alert and  oriented x4;  grossly normal neurologically. Skin:  Warm and dry, intact without significant lesions.  Psych: Normal mood and affect.  Intake/Output from previous day: 08/15 0701 - 08/16 0700 In: 1064.7 [I.V.:1000; IV Piggyback:64.7] Out: -  Intake/Output this shift: No intake/output data recorded.  Lab Results: Recent Labs    11/17/20 2330 11/19/20 0453  WBC 12.0* 9.5  HGB 12.8 11.0*  HCT 36.7 32.8*  PLT 206 210   BMET Recent Labs    11/17/20 2330 11/19/20 0453  NA 132* 135  K 4.1 3.5  CL 101 101  CO2 23 26  GLUCOSE 249* 120*  BUN 20 31*  CREATININE 1.28* 2.84*  CALCIUM 8.7* 8.8*   LFT Recent Labs    11/17/20 2330  11/19/20 0453  PROT 7.3 5.7*  ALBUMIN 3.5 3.0*  AST 30 25  ALT 27 20  ALKPHOS 158* 119  BILITOT 0.5 0.4    Studies/Results: CT ABDOMEN PELVIS W CONTRAST  Result Date: 11/18/2020 CLINICAL DATA:  Abdominal pain with nausea and vomiting EXAM: CT ABDOMEN AND PELVIS WITH CONTRAST TECHNIQUE: Multidetector CT imaging of the abdomen and pelvis was performed using the standard protocol following bolus administration of intravenous contrast. CONTRAST:  33m OMNIPAQUE IOHEXOL 300 MG/ML  SOLN COMPARISON:  07/12/2020 FINDINGS: LOWER CHEST: Normal. HEPATOBILIARY: Normal hepatic contours. No intra- or extrahepatic biliary dilatation. The gallbladder is normal. PANCREAS: Normal pancreas. No ductal dilatation or peripancreatic fluid collection. SPLEEN: Normal. ADRENALS/URINARY TRACT: The adrenal glands are normal. No hydronephrosis, nephroureterolithiasis or solid renal mass. The urinary bladder is normal for degree of distention STOMACH/BOWEL: There is no hiatal hernia. Normal duodenal course and caliber. No small bowel dilatation or inflammation. Mild fat stranding adjacent to the distal descending colon. Normal appendix. VASCULAR/LYMPHATIC: Normal course and caliber of the major abdominal vessels. No abdominal or pelvic lymphadenopathy. REPRODUCTIVE: Normal uterus. No adnexal mass. MUSCULOSKELETAL. No bony spinal canal stenosis or focal osseous abnormality. OTHER: None. IMPRESSION: Mild fat stranding adjacent to the distal descending colon, which may indicate mild colitis. Electronically Signed   By: KUlyses JarredM.D.   On: 11/18/2020 02:32    Assessment: 30year old female with history of Type 1 diabetes, intermittent N/V, chronic marijuana use, history of opioid  use disorder and followed by [facility] with Suboxone prescribed, recently inpatient April 2022 with abdominal pain, N/V and undergoing EGD at that time with LA Grade D erosive esophagitis without bleeding, gastritis s/p biopsy, normal duodenum. Found to have  candida esophagitis at that time. Repeat EGD had been recommended. Suspected to have underlying gastroparesis in the past; however, multiple GES evaluations negative, most recently normal in June 2022 at [facility]. Presenting now with recurrent acute onset diffuse abdominal pain, associated N/V, possible hematemesis per patient reported. CT abd/pelvis with mild fat stranding adjacent to distal descending colon. Mild leukocytosis with WBC count 12 on admission. Lipase normal.   Clinically improving today with resolution of nausea and vomiting.  Abdominal pain continues, but is no worse.  Primarily epigastric, but some generalized abdominal pain.  Mild decline in hemoglobin from 12.8 yesterday to 11.0 today. ?secondary to reported hematemesis versus hydration effect.  Etiology of acute presentation likely multifactorial.  She endorses daily marijuana use.  Previously concern raised for cannabis hyperemesis syndrome and likely playing a role with symptom presentation.  Known history of severe esophagitis, not taking PPI routinely, and chronically using NSAIDs due to teeth pain.  Hematemesis likely secondary to esophagitis/possible Mallory-Weiss tear. Can't rule out PUD. Less likely biliary etiology.  Mildly elevated alk phos on admission at 158, normalized today.  CT with normal gallbladder and no intrahepatic or extrahepatic biliary dilation we had planned on EGD today for further evaluation, but anesthesia has requested postponing this due to AKI.  Hopefully, we will be able to proceed with EGD tomorrow.   Mild fat stranding adjacent to distal descending colon: non-specific.  2 episodes of watery stool yesterday, now resolved.  Follow clinically for now.  Plan: Full liquid diet today per patient's request. N.p.o. at midnight. Hopefully, will be able to proceed with EGD on 11/20/2020 as long as AKI is improving. Management of AKI per hospitalist. Needs to avoid marijuana. Avoid NSAIDs. Continue PPI  twice daily. Continue to monitor H/H and for overt GI bleeding.   LOS: 1 day    11/19/2020, 10:19 AM   Aliene Altes, PA-C [facility]

## 2020-11-19 NOTE — Progress Notes (Signed)
Nutrition Brief Note  Patient identified on the Malnutrition Screening Tool (MST) Report  Patient has a history of Type 1 diabetes, intermittent N/V, chronic marijuana and opioid use. Presents complaining of abdominal pain, nausea and vomiting.    Wt Readings from Last 15 Encounters:  11/17/20 49.9 kg  07/13/20 48.8 kg  02/19/19 49.9 kg   Stable weight history per review above.  Body mass index is 18.3 kg/m. Patient meets criteria for underweight based on current BMI.   Current diet order is Full liquids. Patient complaining of feeling hungry. No documented intake at this time. Denies vomiting today. NPO after midnight for EGD tomorrow.   Labs and medications reviewed. Diabetes coordinator following.  BMP Latest Ref Rng & Units 11/19/2020 11/17/2020 07/16/2020  Glucose 70 - 99 mg/dL 300(P) 233(A) 076(A)  BUN 6 - 20 mg/dL 26(J) 20 33(L)  Creatinine 0.44 - 1.00 mg/dL 4.56(Y) 5.63(S) 9.37(D)  Sodium 135 - 145 mmol/L 135 132(L) 140  Potassium 3.5 - 5.1 mmol/L 3.5 4.1 4.8  Chloride 98 - 111 mmol/L 101 101 113(H)  CO2 22 - 32 mmol/L 26 23 20(L)  Calcium 8.9 - 10.3 mg/dL 4.2(A) 7.6(O) 1.1(X)     No nutrition interventions warranted at this time. If nutrition issues arise, please consult RD.   Royann Shivers MS,RD,CSG,LDN Contact: Loretha Stapler

## 2020-11-19 NOTE — Progress Notes (Signed)
PROGRESS NOTE    Ann Moyer  XBL:[id number] DOB: 02/22/1991 DOA: 11/17/2020 [facility]    Brief Narrative:   30 y.o. F with T1DM, esophagitis in April, and cyclic vomiting who presents with nausea, vomiting and abdominal pain.   Patient developed nausea, vomiting, and diffuse abdominal pain shortly prior to admission.  This was similar in character to previous episodes.  In the ER, CT abdomen shoewd no fluid collection, small bowel obstruction, diverticulitis or pancreatitis.  There was a small segment of descending colon colitis.     She was admitted for intractable nausea and vomiting.  Assessment & Plan:   Principal Problem:   Intractable abdominal pain Active Problems:   CKD (chronic kidney disease), stage II   Hypertension   Type 1 diabetes mellitus not at goal [facility])   Intractable nausea and vomiting   Cyclic vomiting syndrome   Non-intractable vomiting  Cyclic vomiting syndrome, NOS Patient has a history of recurrent episodes of nausea, vomiting, and abdominal pain.  -Pt noted to have around 3-4 episodes per year over the last several years, sometimes requiring hospitalization for fluids and antiemetics.   -Pt reportedly did undergo gastric emptying study twice in the last several years, both of which have been unremarkable.   -Pt is s/p endoscopy again 4 months ago, that showed esophagitis, and she has been noncompliant with her pantoprazole since then. -Pt is continued on IV protonix -GI following with plans for EGD tomorrow if better hydrated   Given the leading diagnostic consideration is cyclic vomiting, we will avoid opiates, as these are not indicated for CVS, CHS or gastroparesis.   - Continue Ondansetron PRN, Compazine, Phenergan for refractory nausea - Continue IV fluids - Tolerating full liquids    Esophagitis Has previous history last April.  At present she has no chest pain, epigastric pain per se, nor hematemesis or melena.   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 Encourage PO intake -Recheck bmt in AM   DVT prophylaxis: Lovenox subq Code Status: Full Family Communication: Pt in room, family not at bedside  Status is: Inpatient  Remains inpatient appropriate because:Inpatient level of care appropriate due to severity of illness  Dispo: The patient is from: Home              Anticipated d/c is to: Home              Patient currently is not medically stable to d/c.   Difficult to place patient No       Consultants:  GI  Procedures:    Antimicrobials: Anti-infectives (From admission, onward)    None       Subjective: Tolerating full liquid diet. Looking forward to EGD tomorrow  Objective: Vitals:   11/19/20 0130 11/19/20 0715 11/19/20 0749 11/19/20 1333  BP: 102/68 (!) 104/54 129/87 (!) 142/87  Pulse: 82 77 89 92  Resp: 14 14 18 18   Temp:   98.3 F (36.8 C) 97.8 F (36.6 C)  TempSrc:   Oral Oral  SpO2: 99% 99% 100% 100%  Weight:      Height:  Intake/Output Summary (Last 24 hours) at 11/19/2020 1741 Last data filed at 11/19/2020 1618 Gross per 24 hour  Intake 1586.66 ml  Output --  Net 1586.66 ml   Filed Weights   11/17/20 2305  Weight: 49.9 kg    Examination: General exam: Awake, laying in bed, in nad Respiratory system: Normal respiratory effort, no wheezing Cardiovascular system: regular rate, s1, s2 Gastrointestinal system: Soft, nondistended, positive BS Central nervous system: CN2-12 grossly intact, strength intact Extremities: Perfused, no clubbing Skin: Normal skin turgor, no notable skin lesions seen Psychiatry:  Mood normal // no visual hallucinations   Data Reviewed: I have personally reviewed following labs and imaging studies  CBC: Recent Labs  Lab 11/17/20 2330 11/19/20 0453  WBC 12.0* 9.5  HGB 12.8 11.0*  HCT 36.7 32.8*  MCV 86.2 90.1  PLT 206 210   Basic Metabolic Panel: Recent Labs  Lab 11/17/20 2330 11/19/20 0453  NA 132* 135  K 4.1 3.5  CL 101 101  CO2 23 26  GLUCOSE 249* 120*  BUN 20 31*  CREATININE 1.28* 2.84*  CALCIUM 8.7* 8.8*  MG  --  1.8   GFR: Estimated Creatinine Clearance: 22.8 mL/min (A) (by C-G formula based on SCr of 2.84 mg/dL (H)). Liver Function Tests: Recent Labs  Lab 11/17/20 2330 11/19/20 0453  AST 30 25  ALT 27 20  ALKPHOS 158* 119  BILITOT 0.5 0.4  PROT 7.3 5.7*  ALBUMIN 3.5 3.0*   Recent Labs  Lab 11/17/20 2330  LIPASE 21   No results for input(s): AMMONIA in the last 168 hours. Coagulation Profile: No results for input(s): INR, PROTIME in the last 168 hours. Cardiac Enzymes: No results for input(s): CKTOTAL, CKMB, CKMBINDEX, TROPONINI in the last 168 hours. BNP (last 3 results) No results for input(s): PROBNP in the last 8760 hours. HbA1C: No results for input(s): HGBA1C in the last 72 hours. CBG: Recent Labs  Lab 11/19/20 0440 11/19/20 0644 11/19/20 0756 11/19/20 1115 11/19/20 1656  GLUCAP 118* 89 78 106* 352*   Lipid Profile: No results for input(s): CHOL, HDL, LDLCALC, TRIG, CHOLHDL, LDLDIRECT in the last 72 hours. Thyroid Function Tests: No results for input(s): TSH, T4TOTAL, FREET4, T3FREE, THYROIDAB in the last 72 hours. Anemia Panel: No results for input(s): VITAMINB12, FOLATE, FERRITIN, TIBC, IRON, RETICCTPCT in the last 72 hours. Sepsis Labs: No results for input(s): PROCALCITON, LATICACIDVEN in the last 168 hours.  Recent Results (from the past 240 hour(s))  SARS CORONAVIRUS 2 (TAT 6-24 HRS) Nasopharyngeal Nasopharyngeal Swab     Status: None   Collection Time: 11/18/20  5:10 AM   Specimen: Nasopharyngeal  Swab  Result Value Ref Range Status   SARS Coronavirus 2 NEGATIVE NEGATIVE Final    Comment: (NOTE) SARS-CoV-2 target nucleic acids are NOT DETECTED.  The SARS-CoV-2 RNA is generally detectable in upper and lower respiratory specimens during the acute phase of infection. Negative results do not preclude SARS-CoV-2 infection, do not rule out co-infections with other pathogens, and should not be used as the sole basis for treatment or other patient management decisions. Negative results must be combined with clinical observations, patient history, and epidemiological information. The expected result is Negative.  Fact Sheet for Patients: HairSlick.no  Fact Sheet for Healthcare Providers: quierodirigir.com  This test is not yet approved or cleared by the [place] FDA and  has been authorized for detection and/or diagnosis of SARS-CoV-2 by FDA under an Emergency Use Authorization (EUA). This EUA will remain  in effect (meaning this  test can be used) for the duration of the COVID-19 declaration under Se ction 564(b)(1) of the Act, 21 U.S.C. section 360bbb-3(b)(1), unless the authorization is terminated or revoked sooner.  Performed at [facility], [address] [place], [place] [address]      Radiology Studies: CT ABDOMEN PELVIS W CONTRAST  Result Date: 11/18/2020 CLINICAL DATA:  Abdominal pain with nausea and vomiting EXAM: CT ABDOMEN AND PELVIS WITH CONTRAST TECHNIQUE: Multidetector CT imaging of the abdomen and pelvis was performed using the standard protocol following bolus administration of intravenous contrast. CONTRAST:  61mL OMNIPAQUE IOHEXOL 300 MG/ML  SOLN COMPARISON:  07/12/2020 FINDINGS: LOWER CHEST: Normal. HEPATOBILIARY: Normal hepatic contours. No intra- or extrahepatic biliary dilatation. The gallbladder is normal. PANCREAS: Normal pancreas. No ductal dilatation or peripancreatic fluid collection.  SPLEEN: Normal. ADRENALS/URINARY TRACT: The adrenal glands are normal. No hydronephrosis, nephroureterolithiasis or solid renal mass. The urinary bladder is normal for degree of distention STOMACH/BOWEL: There is no hiatal hernia. Normal duodenal course and caliber. No small bowel dilatation or inflammation. Mild fat stranding adjacent to the distal descending colon. Normal appendix. VASCULAR/LYMPHATIC: Normal course and caliber of the major abdominal vessels. No abdominal or pelvic lymphadenopathy. REPRODUCTIVE: Normal uterus. No adnexal mass. MUSCULOSKELETAL. No bony spinal canal stenosis or focal osseous abnormality. OTHER: None. IMPRESSION: Mild fat stranding adjacent to the distal descending colon, which may indicate mild colitis. Electronically Signed   By: Deatra Robinson M.D.   On: 11/18/2020 02:32    Scheduled Meds:  buprenorphine-naloxone  1 tablet Sublingual TID   enoxaparin (LOVENOX) injection  30 mg Subcutaneous Q24H   insulin aspart  0-9 Units Subcutaneous Q4H   insulin glargine-yfgn  10 Units Subcutaneous QHS   pantoprazole (PROTONIX) IV  40 mg Intravenous Q12H   Continuous Infusions:  dextrose 5 % and 0.9% NaCl 75 mL/hr at 11/19/20 1618   promethazine (PHENERGAN) injection (IM or IVPB) Stopped (11/19/20 0325)     LOS: 1 day   Rickey Barbara, MD [facility] Pager On Amion  If 7PM-7AM, please contact night-coverage 11/19/2020, 5:41 PM

## 2020-11-19 NOTE — Progress Notes (Signed)
Pt states that she is in "extreme pain". It is noted that pt is sitting up in bed with heat pack to abd. Pt able to tolerate soup and pudding from her supper tray without difficulty. Pt states she told MD earlier that she needed more pain medicine but that MD did not respond to her request. MD notified that pt requesting pain medication at this time.

## 2020-11-20 ENCOUNTER — Inpatient Hospital Stay (HOSPITAL_COMMUNITY): Payer: Self-pay

## 2020-11-20 ENCOUNTER — Encounter (HOSPITAL_COMMUNITY)
Admission: EM | Disposition: A | Discharge status: Home / Self Care | Payer: Self-pay | Source: Home / Self Care | Attending: Family Medicine

## 2020-11-20 DIAGNOSIS — R1013 Epigastric pain: Secondary | ICD-10-CM

## 2020-11-20 LAB — GLUCOSE, CAPILLARY
Glucose-Capillary: 109 mg/dL — ABNORMAL HIGH (ref 70–99)
Glucose-Capillary: 153 mg/dL — ABNORMAL HIGH (ref 70–99)
Glucose-Capillary: 178 mg/dL — ABNORMAL HIGH (ref 70–99)
Glucose-Capillary: 213 mg/dL — ABNORMAL HIGH (ref 70–99)
Glucose-Capillary: 76 mg/dL (ref 70–99)
Glucose-Capillary: 97 mg/dL (ref 70–99)

## 2020-11-20 LAB — RENAL FUNCTION PANEL
Albumin: 3.5 g/dL (ref 3.5–5.0)
Anion gap: 6 (ref 5–15)
BUN: 41 mg/dL — ABNORMAL HIGH (ref 6–20)
CO2: 21 mmol/L — ABNORMAL LOW (ref 22–32)
Calcium: 8.4 mg/dL — ABNORMAL LOW (ref 8.9–10.3)
Chloride: 104 mmol/L (ref 98–111)
Creatinine, Ser: 4.28 mg/dL — ABNORMAL HIGH (ref 0.44–1.00)
GFR, Estimated: 14 mL/min — ABNORMAL LOW (ref >60)
Glucose, Bld: 130 mg/dL — ABNORMAL HIGH (ref 70–99)
Phosphorus: 4.6 mg/dL (ref 2.5–4.6)
Potassium: 3.9 mmol/L (ref 3.5–5.1)
Sodium: 131 mmol/L — ABNORMAL LOW (ref 135–145)

## 2020-11-20 LAB — URINALYSIS, COMPLETE (UACMP) WITH MICROSCOPIC
Bacteria, UA: NONE SEEN
Bilirubin Urine: NEGATIVE
Glucose, UA: NEGATIVE mg/dL
Ketones, ur: NEGATIVE mg/dL
Leukocytes,Ua: NEGATIVE
Nitrite: NEGATIVE
Protein, ur: 100 mg/dL — AB
Specific Gravity, Urine: 1.011 (ref 1.005–1.030)
pH: 6 (ref 5.0–8.0)

## 2020-11-20 LAB — CBC
HCT: 33.6 % — ABNORMAL LOW (ref 36.0–46.0)
Hemoglobin: 11.3 g/dL — ABNORMAL LOW (ref 12.0–15.0)
MCH: 29.8 pg (ref 26.0–34.0)
MCHC: 33.6 g/dL (ref 30.0–36.0)
MCV: 88.7 fL (ref 80.0–100.0)
Platelets: 222 10*3/uL (ref 150–400)
RBC: 3.79 MIL/uL — ABNORMAL LOW (ref 3.87–5.11)
RDW: 11.8 % (ref 11.5–15.5)
WBC: 10.5 10*3/uL (ref 4.0–10.5)
nRBC: 0 % (ref 0.0–0.2)

## 2020-11-20 LAB — BASIC METABOLIC PANEL
Anion gap: 4 — ABNORMAL LOW (ref 5–15)
BUN: 39 mg/dL — ABNORMAL HIGH (ref 6–20)
CO2: 22 mmol/L (ref 22–32)
Calcium: 8 mg/dL — ABNORMAL LOW (ref 8.9–10.3)
Chloride: 106 mmol/L (ref 98–111)
Creatinine, Ser: 4.4 mg/dL — ABNORMAL HIGH (ref 0.44–1.00)
GFR, Estimated: 13 mL/min — ABNORMAL LOW (ref >60)
Glucose, Bld: 78 mg/dL (ref 70–99)
Potassium: 3.8 mmol/L (ref 3.5–5.1)
Sodium: 132 mmol/L — ABNORMAL LOW (ref 135–145)

## 2020-11-20 LAB — SODIUM, URINE, RANDOM: Sodium, Ur: 45 mmol/L

## 2020-11-20 LAB — PROTEIN / CREATININE RATIO, URINE
Creatinine, Urine: 108.2 mg/dL
Protein Creatinine Ratio: 1.52 mg/mg{Cre} — ABNORMAL HIGH (ref 0.00–0.15)
Total Protein, Urine: 165 mg/dL

## 2020-11-20 SURGERY — ESOPHAGOGASTRODUODENOSCOPY (EGD) WITH PROPOFOL
Anesthesia: Monitor Anesthesia Care

## 2020-11-20 MED ORDER — HEPARIN SODIUM (PORCINE) 5000 UNIT/ML IJ SOLN
5000.0000 [IU] | Freq: Three times a day (TID) | INTRAMUSCULAR | Status: DC
  Filled 2020-11-20: qty 1

## 2020-11-20 MED ORDER — SODIUM CHLORIDE 0.9 % IV SOLN: INTRAVENOUS | Status: DC

## 2020-11-20 MED ORDER — DIPHENHYDRAMINE HCL 25 MG PO CAPS
50.0000 mg | ORAL_CAPSULE | Freq: Every evening | ORAL | Status: DC | PRN
  Administered 2020-11-20: 50 mg via ORAL
  Filled 2020-11-20: qty 2

## 2020-11-20 MED ORDER — HYDROMORPHONE HCL 2 MG PO TABS
1.0000 mg | ORAL_TABLET | ORAL | Status: DC | PRN
  Administered 2020-11-20 – 2020-11-22 (×12): 2 mg via ORAL
  Filled 2020-11-20 (×12): qty 1

## 2020-11-20 MED ORDER — SODIUM CHLORIDE 0.9 % IV BOLUS
500.0000 mL | Freq: Once | INTRAVENOUS | Status: AC
  Administered 2020-11-20: 500 mL via INTRAVENOUS

## 2020-11-20 NOTE — Progress Notes (Addendum)
PROGRESS NOTE  Ann Moyer  YHC:[id number] DOB: 1990-05-25 DOA: 11/17/2020 [facility]   Brief Narrative: Ann Moyer is a 30 y.o. female with a history of T1DM, cyclic vomiting, esophagitis, and opioid use disorder on maintenance suboxone who presented to the ED with abdominal pain, nausea and vomiting similar to prior presentations. CT abd/pelvis was largely reassuring with mild fat stranding along the distal descending colon. She was admitted with plans to undergo EGD though delayed by worsening renal function despite IV fluid resuscitation. Work up for this is ongoing.   Assessment & Plan: Principal Problem:   Intractable abdominal pain Active Problems:   CKD (chronic kidney disease), stage II   Hypertension   Type 1 diabetes mellitus not at goal [facility])   Intractable nausea and vomiting   Cyclic vomiting syndrome   Non-intractable vomiting   Epigastric pain  Abdominal pain: Uncertain etiology, though DDx does include PUD.  - Avoid NSAIDs - Will give low dose oral opioid for pain management while undergoing work up with extensive conversation regarding risks of worsening symptoms and plan to discontinue this ASAP. Dilaudid preferred with renal failure.  - EGD per GI. Can give diet now and make NPO p MN in hope of renal function improvements.  - Empiric PPI.   Cyclic vomiting syndrome: NOS, also possibly cannabinoid hyperemesis. Less likely gastroparesis despite DM with two reported unremarkable gastric emptying studies this year.  - IV fluids, augment as below.  - Antiemetics - If vomiting recurrent, would have to give all medications by IV.   Acute renal failure on stage II CKD:  - Check UA with examination of sediment, check eosinophils - Check urine Na, protein, creatinine.  - Renal U/S. CT at admission without significant abnormality.  - Monitor strict I/O. Currently reports adequate/subjectively normal urine output.  - Renally dose medications.  - Not completely  clear that prerenal azotemia has been ruled out, will give small bolus and increase IVF rate, recheck with CBC in PM.  Marijuana use:  - Cessation counseling has been provided  Opioid use disorder: Has been in treatment for 8 years.  - Continue suboxone - Will not be prescribing opioids at discharge.   T1DM:  - Continue basal and SS insulins  DVT prophylaxis: Heparin Code Status: Full Family Communication: None at bedside Disposition Plan:  Status is: Inpatient  Remains inpatient appropriate because:Ongoing diagnostic testing needed not appropriate for outpatient work up and IV treatments appropriate due to intensity of illness or inability to take PO  Dispo: The patient is from: Home              Anticipated d/c is to: Home              Patient currently is not medically stable to d/c.   Difficult to place patient No  Consultants:  GI  Procedures:  EGD planned  Antimicrobials: None   Subjective: Has been in severe, constant pain throughout the abdomen since arrival which has been treated with ibuprofen at home in the past, takes 400mg  doses prn, usually not daily. Mostly takes this for dental pain. May be worst in epigastrium, at times radiating to the spine. No bleeding reported. Making normal UOP subjectively. Mild swelling on left arm nowhere else. No hx kidney failure.   Objective: Vitals:   11/19/20 0749 11/19/20 1333 11/19/20 2116 11/20/20 0359  BP: 129/87 (!) 142/87 132/83 128/83  Pulse: 89 92 87 86  Resp: 18 18 19 19   Temp: 98.3 F (36.8  C) 97.8 F (36.6 C) 97.8 F (36.6 C) 98.4 F (36.9 C)  TempSrc: Oral Oral Oral Oral  SpO2: 100% 100% 100% 99%  Weight:      Height:        Intake/Output Summary (Last 24 hours) at 11/20/2020 1340 Last data filed at 11/19/2020 1618 Gross per 24 hour  Intake 521.95 ml  Output --  Net 521.95 ml   Filed Weights   11/17/20 2305  Weight: 49.9 kg    Gen: 30 y.o. female in no distress HEENT: Diffuse dental caries with  some broken molars. No visible abscess. Pulm: Non-labored breathing room air. Clear to auscultation bilaterally.  CV: Regular rate and rhythm. No murmur, rub, or gallop. No JVD, no pedal edema. GI: Abdomen soft, scaphoid, diffusely tender without rebound, +voluntary guarding, +BS. No organomegaly or masses felt. Ext: Warm, no deformities Skin: No rashes, lesions or ulcers Neuro: Alert and oriented. No focal neurological deficits. Psych: Judgement and insight appear normal. Mood & affect appropriate.   Data Reviewed: I have personally reviewed following labs and imaging studies  CBC: Recent Labs  Lab 11/17/20 2330 11/19/20 0453  WBC 12.0* 9.5  HGB 12.8 11.0*  HCT 36.7 32.8*  MCV 86.2 90.1  PLT 206 210   Basic Metabolic Panel: Recent Labs  Lab 11/17/20 2330 11/19/20 0453 11/20/20 0753  NA 132* 135 132*  K 4.1 3.5 3.8  CL 101 101 106  CO2 23 26 22   GLUCOSE 249* 120* 78  BUN 20 31* 39*  CREATININE 1.28* 2.84* 4.40*  CALCIUM 8.7* 8.8* 8.0*  MG  --  1.8  --    GFR: Estimated Creatinine Clearance: 14.7 mL/min (A) (by C-G formula based on SCr of 4.4 mg/dL (H)). Liver Function Tests: Recent Labs  Lab 11/17/20 2330 11/19/20 0453  AST 30 25  ALT 27 20  ALKPHOS 158* 119  BILITOT 0.5 0.4  PROT 7.3 5.7*  ALBUMIN 3.5 3.0*   Recent Labs  Lab 11/17/20 2330  LIPASE 21   No results for input(s): AMMONIA in the last 168 hours. Coagulation Profile: No results for input(s): INR, PROTIME in the last 168 hours. Cardiac Enzymes: No results for input(s): CKTOTAL, CKMB, CKMBINDEX, TROPONINI in the last 168 hours. BNP (last 3 results) No results for input(s): PROBNP in the last 8760 hours. HbA1C: No results for input(s): HGBA1C in the last 72 hours. CBG: Recent Labs  Lab 11/19/20 1958 11/19/20 2349 11/20/20 0354 11/20/20 0731 11/20/20 1112  GLUCAP 267* 97 97 76 178*   Lipid Profile: No results for input(s): CHOL, HDL, LDLCALC, TRIG, CHOLHDL, LDLDIRECT in the last 72  hours. Thyroid Function Tests: No results for input(s): TSH, T4TOTAL, FREET4, T3FREE, THYROIDAB in the last 72 hours. Anemia Panel: No results for input(s): VITAMINB12, FOLATE, FERRITIN, TIBC, IRON, RETICCTPCT in the last 72 hours. Urine analysis:    Component Value Date/Time   COLORURINE YELLOW 11/20/2020 1031   APPEARANCEUR HAZY (A) 11/20/2020 1031   LABSPEC 1.011 11/20/2020 1031   PHURINE 6.0 11/20/2020 1031   GLUCOSEU NEGATIVE 11/20/2020 1031   HGBUR SMALL (A) 11/20/2020 1031   BILIRUBINUR NEGATIVE 11/20/2020 1031   KETONESUR NEGATIVE 11/20/2020 1031   PROTEINUR 100 (A) 11/20/2020 1031   NITRITE NEGATIVE 11/20/2020 1031   LEUKOCYTESUR NEGATIVE 11/20/2020 1031   Recent Results (from the past 240 hour(s))  SARS CORONAVIRUS 2 (TAT 6-24 HRS) Nasopharyngeal Nasopharyngeal Swab     Status: None   Collection Time: 11/18/20  5:10 AM   Specimen: Nasopharyngeal Swab  Result Value Ref Range Status   SARS Coronavirus 2 NEGATIVE NEGATIVE Final    Comment: (NOTE) SARS-CoV-2 target nucleic acids are NOT DETECTED.  The SARS-CoV-2 RNA is generally detectable in upper and lower respiratory specimens during the acute phase of infection. Negative results do not preclude SARS-CoV-2 infection, do not rule out co-infections with other pathogens, and should not be used as the sole basis for treatment or other patient management decisions. Negative results must be combined with clinical observations, patient history, and epidemiological information. The expected result is Negative.  Fact Sheet for Patients: HairSlick.no  Fact Sheet for Healthcare Providers: quierodirigir.com  This test is not yet approved or cleared by the [place] FDA and  has been authorized for detection and/or diagnosis of SARS-CoV-2 by FDA under an Emergency Use Authorization (EUA). This EUA will remain  in effect (meaning this test can be used) for the duration  of the COVID-19 declaration under Se ction 564(b)(1) of the Act, 21 U.S.C. section 360bbb-3(b)(1), unless the authorization is terminated or revoked sooner.  Performed at [facility], [address], [place], [place] [address]       Radiology Studies: No results found.  Scheduled Meds:  buprenorphine-naloxone  1 tablet Sublingual TID   enoxaparin (LOVENOX) injection  30 mg Subcutaneous Q24H   insulin aspart  0-9 Units Subcutaneous Q4H   insulin glargine-yfgn  10 Units Subcutaneous QHS   pantoprazole (PROTONIX) IV  40 mg Intravenous Q12H   Continuous Infusions:  sodium chloride 125 mL/hr at 11/20/20 0922   promethazine (PHENERGAN) injection (IM or IVPB) 12.5 mg (11/19/20 2354)     LOS: 2 days   Time spent: 35 minutes.  Tyrone Nine, MD [facility] [email or web address] 11/20/2020, 1:40 PM

## 2020-11-20 NOTE — Progress Notes (Signed)
Patient once again in crying spell, does not want to be stuck anymore but IV is leaking and dripping onto her pants. Physiological scientist after unsuccessful attempt. Patient states [facility] has to use U/S to get labs and IV.

## 2020-11-20 NOTE — Progress Notes (Signed)
Patient remains without IV. Charge could not find a site. Notified [facility]. They will attempt.

## 2020-11-20 NOTE — Progress Notes (Signed)
This am patient was awaiting lab results and was informed kidney function had declined further. She became very upset, screaming, kicking the bed, hitting herself in the head, the side, tossing things across the room. She was screaming about not being able to eat. I notified Dr. Jarvis Newcomer who came to speak to patient. After much discussion patient calmed to treatment plan.  Patient now calm in her room. Has been given a soft diet and provided liquids. Iv infusing without difficulty. Received a bolus. Urine sent to lab. No nausea. Provided her a pain pill as ordered. Spoke with someone on the phone.

## 2020-11-20 NOTE — Progress Notes (Signed)
Results for ODETH, BRY (MRN [id number]) as of 11/20/2020 15:59  Ref. Range 11/19/2020 19:58 11/19/2020 23:49 11/20/2020 03:54 11/20/2020 07:31 11/20/2020 11:12  Glucose-Capillary Latest Ref Range: 70 - 99 mg/dL 528 (H) 97 97 76 413 (H)   Recommend changing Novolog  SENSITIVE correction scale to TID & Novolog (0-5 units) HS scale when patient is eating regularly.   Smith Mince RN BSN CDE Diabetes Coordinator Pager: [phone]  8am-5pm

## 2020-11-20 NOTE — Progress Notes (Signed)
Subjective: Patient doing well this morning. Still having some mild, mid upper abdominal pain with some mild nausea. Denies vomiting. Had 1 BM this morning that was soft, denies melena or hematochezia. Was able to tolerate soft food diet. States she feels better now that she is receiving pain medication. She was able to ambulate some in the hall this morning which seemed to help the "epigastric" pressure she has been feeling.  Objective: Vital signs in last 24 hours: Temp:  [97.8 F (36.6 C)-98.4 F (36.9 C)] 98.4 F (36.9 C) (08/17 0359) Pulse Rate:  [86-92] 86 (08/17 0359) Resp:  [18-19] 19 (08/17 0359) BP: (128-142)/(83-87) 128/83 (08/17 0359) SpO2:  [99 %-100 %] 99 % (08/17 0359) FiO2 (%):  [21 %] 21 % (08/16 1333)   General:   Alert and oriented, pleasant Head:  Normocephalic and atraumatic. Eyes:  No icterus, sclera clear. Conjuctiva pink.  Mouth:  Without lesions, mucosa pink and moist.  Heart:  S1, S2 present, no murmurs noted.  Lungs: Clear to auscultation bilaterally, without wheezing, rales, or rhonchi.  Abdomen:  Bowel sounds present, soft, non-distended. No HSM or hernias noted. No rebound or guarding. No masses appreciated. Mild TTP mid upper abdomen Msk:  Symmetrical without gross deformities. Normal posture. Pulses:  Normal pulses noted. Extremities:  Without clubbing or edema. Neurologic:  Alert and  oriented x4;  grossly normal neurologically. Skin:  Warm and dry, intact without significant lesions.  Psych:  Alert and cooperative. Normal mood and affect.  Intake/Output from previous day: 08/16 0701 - 08/17 0700 In: 522 [I.V.:522] Out: -  Intake/Output this shift: No intake/output data recorded.  Lab Results: Recent Labs    11/17/20 2330 11/19/20 0453  WBC 12.0* 9.5  HGB 12.8 11.0*  HCT 36.7 32.8*  PLT 206 210   BMET Recent Labs    11/17/20 2330 11/19/20 0453 11/20/20 0753  NA 132* 135 132*  K 4.1 3.5 3.8  CL 101 101 106  CO2 23 26 22    GLUCOSE 249* 120* 78  BUN 20 31* 39*  CREATININE 1.28* 2.84* 4.40*  CALCIUM 8.7* 8.8* 8.0*   LFT Recent Labs    11/17/20 2330 11/19/20 0453  PROT 7.3 5.7*  ALBUMIN 3.5 3.0*  AST 30 25  ALT 27 20  ALKPHOS 158* 119  BILITOT 0.5 0.4   Assessment: 30 year old female with history of Type 1 DM, intermittent N/V, chronic marijuana use, history of opioid use disorder, and followed by [facility] with suboxone prescribed. Patient recently admitted April 2022 with abdominal pain, n/v, underwent EGD that time with LA Grade D erosive esophagitis without bleeding, gastritis s/p biopsy, normal duodenum. Found to have candida esophagitis at that time. Repeat EGD is recommended. Patient has been suspected to have underlying gastroparesis in the past, however, mulitiple GES evaluations have been negative, most recently normal in June 2022 at [facility]. Patient presenting now with recurrent acute onset diffuse abdominal pain, associated N/V, possible hematemesis per patient report. CT abd/pelvis with mild fat stranding adjacent to distal descending colon, mild leukocytosis with WBC count 12 on admission, Lipase WNL.   Mild decline in hemoglobin yesterday from 12.8 to 11, likely due to IV rehydration, however patient did report hematemesis on admission. No hematemesis or other overt GI bleeding during hospitalization.  Acute presentation is likely multifactorial as patient endorses daily marijuana use, vomiting could very well be from cannabis hyperemesis syndrome. She also has history of severe esophagtitis and has not been taking PPI as prescribed, she  also chronically uses NSAIDs (468m ibuprofen per day) to treat dental pain. Reported hematemesis is likely related to esophagitis or possible Mallory-Weiss tear, PUD cannot be ruled out. Mildly elevated alk phos on admission at 158, improved yesterday. Normal gallbladder, no intrahepatic or extrahepatic biliary dilation on CT abdomen. EGD was postponed yesterday  by request of anesthesia due to AKI, postpone again today for continued elevation of creatnine. Will continue to trend this in hopes that EGD can be done tomorrow.   Mild fat stranding adjacent to distal descending colon, non specific. Patient reported 2 episodes of watery stools Monday, no other episodes since. Will follow clinically for now.    Plan: Continue soft diet as tolerated Possibly proceed with EGD tomorrow if creatnine improves NPO at midnight in prep for possible EGD tomorrow Management of AKI per hospitalist Avoid marijuana use Avoid NSAIDs Continue PPI BID Continue to trend H&H, monitor for overt GI bleeding   LOS: 2 days    11/20/2020, 9:08 AM  Delio Slates L. CAlver Sorrow MSN, APRN, AGNP-C Adult-Gerontology Nurse Practitioner [facility]

## 2020-11-21 ENCOUNTER — Encounter (HOSPITAL_COMMUNITY)
Admission: EM | Disposition: A | Discharge status: Home / Self Care | Payer: Self-pay | Source: Home / Self Care | Attending: Family Medicine

## 2020-11-21 ENCOUNTER — Inpatient Hospital Stay (HOSPITAL_COMMUNITY): Payer: Self-pay | Admitting: Anesthesiology

## 2020-11-21 HISTORY — PX: BIOPSY: SHX5522

## 2020-11-21 HISTORY — PX: ESOPHAGOGASTRODUODENOSCOPY (EGD) WITH PROPOFOL: SHX5813

## 2020-11-21 LAB — GLUCOSE, CAPILLARY
Glucose-Capillary: 193 mg/dL — ABNORMAL HIGH (ref 70–99)
Glucose-Capillary: 63 mg/dL — ABNORMAL LOW (ref 70–99)
Glucose-Capillary: 115 mg/dL — ABNORMAL HIGH (ref 70–99)
Glucose-Capillary: 101 mg/dL — ABNORMAL HIGH (ref 70–99)
Glucose-Capillary: 95 mg/dL (ref 70–99)
Glucose-Capillary: 246 mg/dL — ABNORMAL HIGH (ref 70–99)
Glucose-Capillary: 199 mg/dL — ABNORMAL HIGH (ref 70–99)

## 2020-11-21 LAB — RENAL FUNCTION PANEL
Albumin: 2.9 g/dL — ABNORMAL LOW (ref 3.5–5.0)
Anion gap: 7 (ref 5–15)
BUN: 44 mg/dL — ABNORMAL HIGH (ref 6–20)
CO2: 22 mmol/L (ref 22–32)
Calcium: 8.4 mg/dL — ABNORMAL LOW (ref 8.9–10.3)
Chloride: 104 mmol/L (ref 98–111)
Creatinine, Ser: 3.83 mg/dL — ABNORMAL HIGH (ref 0.44–1.00)
GFR, Estimated: 16 mL/min — ABNORMAL LOW (ref >60)
Glucose, Bld: 113 mg/dL — ABNORMAL HIGH (ref 70–99)
Phosphorus: 4.5 mg/dL (ref 2.5–4.6)
Potassium: 3.9 mmol/L (ref 3.5–5.1)
Sodium: 133 mmol/L — ABNORMAL LOW (ref 135–145)

## 2020-11-21 SURGERY — ESOPHAGOGASTRODUODENOSCOPY (EGD) WITH PROPOFOL
Anesthesia: General

## 2020-11-21 MED ORDER — MIDAZOLAM HCL 2 MG/2ML IJ SOLN
INTRAMUSCULAR | Status: AC
  Filled 2020-11-21: qty 4

## 2020-11-21 MED ORDER — SODIUM CHLORIDE 0.9 % IV SOLN: INTRAVENOUS | Status: DC

## 2020-11-21 MED ORDER — INSULIN ASPART 100 UNIT/ML IJ SOLN
0.0000 [IU] | Freq: Three times a day (TID) | INTRAMUSCULAR | Status: DC
  Administered 2020-11-21: 2 [IU] via SUBCUTANEOUS

## 2020-11-21 MED ORDER — MIDAZOLAM HCL 5 MG/5ML IJ SOLN
INTRAMUSCULAR | Status: DC | PRN
  Administered 2020-11-21 (×2): 2 mg via INTRAVENOUS

## 2020-11-21 MED ORDER — LACTATED RINGERS IV BOLUS: 500.0000 mL | Freq: Once | INTRAVENOUS | Status: DC

## 2020-11-21 MED ORDER — LACTATED RINGERS IV SOLN: INTRAVENOUS | Status: DC

## 2020-11-21 MED ORDER — FENTANYL CITRATE (PF) 100 MCG/2ML IJ SOLN: 25.0000 ug | INTRAMUSCULAR | Status: DC | PRN

## 2020-11-21 MED ORDER — PROPOFOL 10 MG/ML IV BOLUS
INTRAVENOUS | Status: DC | PRN
Start: 2020-11-21 — End: 2020-11-21
  Administered 2020-11-21: 100 mg via INTRAVENOUS
  Administered 2020-11-21: 150 ug/kg/min via INTRAVENOUS

## 2020-11-21 MED ORDER — STERILE WATER FOR IRRIGATION IR SOLN
Status: DC | PRN
  Administered 2020-11-21: 100 mL

## 2020-11-21 MED ORDER — ONDANSETRON HCL 4 MG/2ML IJ SOLN: 4.0000 mg | Freq: Once | INTRAMUSCULAR | Status: DC | PRN

## 2020-11-21 MED ORDER — LIDOCAINE HCL (CARDIAC) PF 100 MG/5ML IV SOSY
PREFILLED_SYRINGE | INTRAVENOUS | Status: DC | PRN
  Administered 2020-11-21: 100 mg via INTRATRACHEAL

## 2020-11-21 MED ORDER — HEPARIN SODIUM (PORCINE) 5000 UNIT/ML IJ SOLN: 5000.0000 [IU] | Freq: Three times a day (TID) | INTRAMUSCULAR | Status: DC

## 2020-11-21 NOTE — Progress Notes (Signed)
PROGRESS NOTE  Ann Moyer  CZY:[id number] DOB: 1990-07-19 DOA: 11/17/2020 [facility]   Brief Narrative: Ann Moyer is a 30 y.o. female with a history of T1DM, cyclic vomiting, esophagitis, and opioid use disorder on maintenance suboxone who presented to the ED with abdominal pain, nausea and vomiting similar to prior presentations. CT abd/pelvis was largely reassuring with mild fat stranding along the distal descending colon. She was admitted with plans to undergo EGD though delayed by worsening renal function despite IV fluid resuscitation. Work up for this is ongoing.   Assessment & Plan: Principal Problem:   Intractable abdominal pain Active Problems:   CKD (chronic kidney disease), stage II   Hypertension   Type 1 diabetes mellitus not at goal [facility])   Intractable nausea and vomiting   Cyclic vomiting syndrome   Non-intractable vomiting   Epigastric pain  Abdominal pain: Uncertain etiology, though DDx does include PUD.  - Avoid NSAIDs - Will give low dose oral opioid for pain management while undergoing work up with extensive conversation regarding risks of worsening symptoms and plan to discontinue this ASAP. Dilaudid preferred with renal failure.  - EGD per GI, possibly today. Keep NPO. - Empiric PPI.   Cyclic vomiting syndrome: NOS, also possibly cannabinoid hyperemesis. Less likely gastroparesis despite DM with two reported unremarkable gastric emptying studies this year.  - IV fluids, augmented as below. Symptoms improved while NPO - Antiemetics - If vomiting recurrent, would have to give all medications by IV.   Acute renal failure on stage II CKD: Hyaline casts on UA with WBCs without bacteriuria. Proteinuria (spot Pr:Cr 1.52) noted and FENa is ~1.5% yesterday suggestive of mixed picture with ATN and decreased EABV. U/S unremarkable.  - Showing improvement over past 24 hours, UOP is brisk, electrolytes are stable with only minimal edema. Continue strict I/O and  IVF through today with plan to recheck 8/19.  - Renally dose medications.  Marijuana use:  - Cessation counseling has been provided  Opioid use disorder: Has been in treatment for 8 years.  - Continue suboxone - Will not be prescribing opioids at discharge.   T1DM: with hypoglycemia in setting of NPO status. Suspect decreased renal function also contributing.  - Continue low dose basal insulin, will adjust SSI downward to ESRD scale while NPO.   DVT prophylaxis: Heparin Code Status: Full Family Communication: None at bedside Disposition Plan:  Status is: Inpatient  Remains inpatient appropriate because:Ongoing diagnostic testing needed not appropriate for outpatient work up and IV treatments appropriate due to intensity of illness or inability to take PO  Dispo: The patient is from: Home              Anticipated d/c is to: Home              Patient currently is not medically stable to d/c.   Difficult to place patient No  Consultants:  GI  Procedures:  EGD planned  Antimicrobials: None   Subjective: Pain much better, nausea is mild, no vomiting. Got stuck 7 times for replacement of IV yesterday and this morning before PACU RN could get a small gauge PIV in right hand today, so didn't get IVF for much of yesterday/overnight.   Objective: Vitals:   11/20/20 0359 11/20/20 1422 11/20/20 2032 11/21/20 0511  BP: 128/83 125/72 138/88 (!) 145/95  Pulse: 86 86 83 86  Resp: 19 19 20 19   Temp: 98.4 F (36.9 C) 98.6 F (37 C) 98.6 F (37 C) 98.3 F (36.8  C)  TempSrc: Oral Oral Oral Oral  SpO2: 99% 99% 99% 97%  Weight:      Height:        Intake/Output Summary (Last 24 hours) at 11/21/2020 1151 Last data filed at 11/21/2020 0800 Gross per 24 hour  Intake 1516.42 ml  Output 300 ml  Net 1216.42 ml   Filed Weights   11/17/20 2305  Weight: 49.9 kg   Gen: 30 y.o. female in no distress Pulm: Nonlabored breathing room air. Clear. CV: Regular rate and rhythm. No murmur,  rub, or gallop. No JVD, no dependent edema. GI: Abdomen soft, very mildly tender diffusely with some worsening in epigastrium, nondistended, +BS.   Ext: Warm, no deformities Skin: No new rashes, lesions or ulcers on visualized skin. Neuro: Alert and oriented. No focal neurological deficits. Psych: Judgement and insight appear fair. Calm at this time.    Data Reviewed: I have personally reviewed following labs and imaging studies  CBC: Recent Labs  Lab 11/17/20 2330 11/19/20 0453 11/20/20 1610  WBC 12.0* 9.5 10.5  HGB 12.8 11.0* 11.3*  HCT 36.7 32.8* 33.6*  MCV 86.2 90.1 88.7  PLT 206 210 222   Basic Metabolic Panel: Recent Labs  Lab 11/17/20 2330 11/19/20 0453 11/20/20 0753 11/20/20 1610 11/21/20 0623  NA 132* 135 132* 131* 133*  K 4.1 3.5 3.8 3.9 3.9  CL 101 101 106 104 104  CO2 23 26 22  21* 22  GLUCOSE 249* 120* 78 130* 113*  BUN 20 31* 39* 41* 44*  CREATININE 1.28* 2.84* 4.40* 4.28* 3.83*  CALCIUM 8.7* 8.8* 8.0* 8.4* 8.4*  MG  --  1.8  --   --   --   PHOS  --   --   --  4.6 4.5   GFR: Estimated Creatinine Clearance: 16.9 mL/min (A) (by C-G formula based on SCr of 3.83 mg/dL (H)). Liver Function Tests: Recent Labs  Lab 11/17/20 2330 11/19/20 0453 11/20/20 1610 11/21/20 0623  AST 30 25  --   --   ALT 27 20  --   --   ALKPHOS 158* 119  --   --   BILITOT 0.5 0.4  --   --   PROT 7.3 5.7*  --   --   ALBUMIN 3.5 3.0* 3.5 2.9*   Recent Labs  Lab 11/17/20 2330  LIPASE 21   No results for input(s): AMMONIA in the last 168 hours. Coagulation Profile: No results for input(s): INR, PROTIME in the last 168 hours. Cardiac Enzymes: No results for input(s): CKTOTAL, CKMB, CKMBINDEX, TROPONINI in the last 168 hours. BNP (last 3 results) No results for input(s): PROBNP in the last 8760 hours. HbA1C: No results for input(s): HGBA1C in the last 72 hours. CBG: Recent Labs  Lab 11/20/20 1642 11/20/20 2028 11/20/20 2345 11/21/20 0413 11/21/20 0811  GLUCAP 153*  213* 109* 193* 63*   Lipid Profile: No results for input(s): CHOL, HDL, LDLCALC, TRIG, CHOLHDL, LDLDIRECT in the last 72 hours. Thyroid Function Tests: No results for input(s): TSH, T4TOTAL, FREET4, T3FREE, THYROIDAB in the last 72 hours. Anemia Panel: No results for input(s): VITAMINB12, FOLATE, FERRITIN, TIBC, IRON, RETICCTPCT in the last 72 hours. Urine analysis:    Component Value Date/Time   COLORURINE YELLOW 11/20/2020 1031   APPEARANCEUR HAZY (A) 11/20/2020 1031   LABSPEC 1.011 11/20/2020 1031   PHURINE 6.0 11/20/2020 1031   GLUCOSEU NEGATIVE 11/20/2020 1031   HGBUR SMALL (A) 11/20/2020 1031   BILIRUBINUR NEGATIVE 11/20/2020 1031   KETONESUR  NEGATIVE 11/20/2020 1031   PROTEINUR 100 (A) 11/20/2020 1031   NITRITE NEGATIVE 11/20/2020 1031   LEUKOCYTESUR NEGATIVE 11/20/2020 1031   Recent Results (from the past 240 hour(s))  SARS CORONAVIRUS 2 (TAT 6-24 HRS) Nasopharyngeal Nasopharyngeal Swab     Status: None   Collection Time: 11/18/20  5:10 AM   Specimen: Nasopharyngeal Swab  Result Value Ref Range Status   SARS Coronavirus 2 NEGATIVE NEGATIVE Final    Comment: (NOTE) SARS-CoV-2 target nucleic acids are NOT DETECTED.  The SARS-CoV-2 RNA is generally detectable in upper and lower respiratory specimens during the acute phase of infection. Negative results do not preclude SARS-CoV-2 infection, do not rule out co-infections with other pathogens, and should not be used as the sole basis for treatment or other patient management decisions. Negative results must be combined with clinical observations, patient history, and epidemiological information. The expected result is Negative.  Fact Sheet for Patients: HairSlick.no  Fact Sheet for Healthcare Providers: quierodirigir.com  This test is not yet approved or cleared by the [place] FDA and  has been authorized for detection and/or diagnosis of SARS-CoV-2 by FDA  under an Emergency Use Authorization (EUA). This EUA will remain  in effect (meaning this test can be used) for the duration of the COVID-19 declaration under Se ction 564(b)(1) of the Act, 21 U.S.C. section 360bbb-3(b)(1), unless the authorization is terminated or revoked sooner.  Performed at [facility], [address], [place], [place] [address]       Radiology Studies: US RENAL  Result Date: 11/20/2020 CLINICAL DATA:  Acute kidney injury EXAM: RENAL / URINARY TRACT ULTRASOUND COMPLETE COMPARISON:  No prior ultrasound, correlation is made to CT abdomen pelvis 11/18/2020 FINDINGS: Right Kidney: Renal measurements: 11.8 x 4.0 x 5 3 cm = volume: 132 mL. Echogenicity within normal limits. No mass or hydronephrosis visualized. Left Kidney: Renal measurements: 11.6 x 4.5 x 4.8 cm = volume: 128 mL. Echogenicity within normal limits. No mass or hydronephrosis visualized. Bladder: The bladder is not well distended, limiting evaluation, although the wall does appear somewhat irregular, and measures to 0.8 cm. Other: None. IMPRESSION: 1. Normal kidneys bilaterally.  No hydronephrosis or mass. 2. Mild bladder wall irregularity, which may be due to underdistension of the bladder. Electronically Signed   By: Wiliam Ke M.D.   On: 11/20/2020 14:30    Scheduled Meds:  buprenorphine-naloxone  1 tablet Sublingual TID   heparin injection (subcutaneous)  5,000 Units Subcutaneous Q8H   insulin aspart  0-6 Units Subcutaneous TID WC   insulin glargine-yfgn  10 Units Subcutaneous QHS   pantoprazole (PROTONIX) IV  40 mg Intravenous Q12H   Continuous Infusions:  sodium chloride 125 mL/hr at 11/20/20 0922   sodium chloride     promethazine (PHENERGAN) injection (IM or IVPB) 12.5 mg (11/19/20 2354)     LOS: 3 days   Time spent: 35 minutes.  Tyrone Nine, MD [facility] [email or web address] 11/21/2020, 11:51 AM

## 2020-11-21 NOTE — Anesthesia Postprocedure Evaluation (Signed)
Anesthesia Post Note  Patient: Rubena Roseman  Procedure(s) Performed: ESOPHAGOGASTRODUODENOSCOPY (EGD) WITH PROPOFOL BIOPSY  Patient location during evaluation: PACU Anesthesia Type: General Level of consciousness: awake and alert and oriented Pain management: pain level controlled Vital Signs Assessment: post-procedure vital signs reviewed and stable Respiratory status: spontaneous breathing and respiratory function stable Cardiovascular status: blood pressure returned to baseline and stable Postop Assessment: no apparent nausea or vomiting Anesthetic complications: no   No notable events documented.   Last Vitals:  Vitals:   11/21/20 1430 11/21/20 1447  BP: 134/90 (!) 115/100  Pulse: 79 98  Resp: 10 (!) 29  Temp:    SpO2: 100% 100%    Last Pain:  Vitals:   11/21/20 1442  TempSrc:   PainSc: 0-No pain                 Zadiel Leyh C Cartez Mogle

## 2020-11-21 NOTE — Progress Notes (Signed)
CBG- 101 resulted at 1325. The results did not transfer to chart after docking the glucometer.

## 2020-11-21 NOTE — Op Note (Signed)
[facility] Patient Name: Ann Moyer Procedure Date: 11/21/2020 1:40 PM MRN: [id number] Date of Birth: 09/06/1990 Attending MD: Gennette Pac , MD CSN: [id number] Age: 30 Admit Type: Inpatient Procedure:                Upper GI endoscopy Indications:              Nausea with vomiting Providers:                Gennette Pac, MD, Crystal Page, Jannett Celestine, RN, Kristine L. Jessee Avers, Technician Referring MD:              Medicines:                Propofol per Anesthesia Complications:            No immediate complications. Estimated Blood Loss:     Estimated blood loss was minimal. Procedure:                Pre-Anesthesia Assessment:                           - Prior to the procedure, a History and Physical                            was performed, and patient medications and                            allergies were reviewed. The patient's tolerance of                            previous anesthesia was also reviewed. The risks                            and benefits of the procedure and the sedation                            options and risks were discussed with the patient.                            All questions were answered, and informed consent                            was obtained. Prior Anticoagulants: The patient has                            taken no previous anticoagulant or antiplatelet                            agents. ASA Grade Assessment: III - A patient with                            severe systemic disease. After reviewing the risks  and benefits, the patient was deemed in                            satisfactory condition to undergo the procedure.                           After obtaining informed consent, the endoscope was                            passed under direct vision. Throughout the                            procedure, the patient's blood pressure, pulse, and                             oxygen saturations were monitored continuously. The                            [id number]-[id number] ([id number]) scope was introduced through the                            mouth, and advanced to the second part of duodenum.                            The upper GI endoscopy was accomplished without                            difficulty. The patient tolerated the procedure                            well. Scope In: 2:00:09 PM Scope Out: 2:07:57 PM Total Procedure Duration: 0 hours 7 minutes 48 seconds  Findings:      The examined esophagus was normal. Stomach had about 200 cc of bile       stained fluid which was easily suctioned out. Patchy erythema and a       couple of body and antral erosions present. No ulcer or infiltrating       process. Pylorus patent.      The duodenal bulb and second portion of the duodenum were normal.       Biopsies of the abnormal gastric mucosa taken for histologic study. Impression:               - Normal esophagus. Patchy gastric erosions and                            erythema of doubtful clinical significance?"status                            post biopsy                           - Normal duodenal bulb and second portion of the                            duodenum.                           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 Moderate Sedation:      Moderate (conscious) sedation was personally administered by an       anesthesia professional. The following parameters were monitored: oxygen       saturation, heart rate, blood pressure, respiratory rate, EKG, adequacy       of pulmonary ventilation, and response to care. Recommendation:           - Patient has a contact number available for                            emergencies. The signs and symptoms of potential                            delayed complications were discussed with the                            patient. Return to normal activities  tomorrow.                            Written discharge instructions were provided to the                            patient.                           Carbohydrate modified bariatric type diet. Continue                            acid suppression therapy. Follow-up on pathology. Procedure Code(s):        --- Professional ---                           [id number], Esophagogastroduodenoscopy, flexible,                            transoral; diagnostic, including collection of                            specimen(s) by brushing or washing, when performed                            (separate procedure) Diagnosis Code(s):        --- Professional ---                           R11.2, Nausea with vomiting, unspecified CPT copyright 2019 American Medical Association. All rights reserved. The codes documented in this report are preliminary and upon coder review may  be revised to meet current compliance requirements. Gerrit Friends. Robby Pirani, MD Gennette Pac, MD 11/21/2020 2:18:15 PM This report has been signed electronically. Number of Addenda: 0

## 2020-11-21 NOTE — Progress Notes (Signed)
Patient's mood rapidly shifts. She was told that the GI doctor who performed her procedure placed order for a full liquid diet due to her condition and continued symptoms. She stated to writer that we were starving her and began to wail that the doctor lied to her about her diet. Reached out to the [employer] had been discussing patient with about the situation at that time. While in room with patient, she stated she couldn't tolerate any more cream of chicken. Dialed dietary for her to see if there were other soup options for her. They had two options, tomato and the cream of chicken. She began to wail, smacked the phone on her bedside table and requested speaking to the physician. She stated "they should be the ones getting cussed out". She then told the lady on the phone that she would take the tomato soup, and slammed the phone down again. After the phone call, dietary called me to speak about the patient, unaware I was in room with patient during this encounter. Verified order and that I was currently in talk with the GI team regarding the food situation. Explained to patient again that she was having continued pain and nausea. Stated that the hospital was like prison and began to cry in the window area, stating "I got treated better in jail, this is prison, this is prison, this is prison, this is prison."  Her diet has been advanced to soft at this time. Will update dietary.

## 2020-11-21 NOTE — Transfer of Care (Signed)
Immediate Anesthesia Transfer of Care Note  Patient: Ann Moyer  Procedure(s) Performed: ESOPHAGOGASTRODUODENOSCOPY (EGD) WITH PROPOFOL BIOPSY  Patient Location: PACU  Anesthesia Type:General  Level of Consciousness: drowsy, patient cooperative and responds to stimulation  Airway & Oxygen Therapy: Patient Spontanous Breathing  Post-op Assessment: Report given to RN, Post -op Vital signs reviewed and stable and Patient moving all extremities X 4  Post vital signs: Reviewed and stable  Last Vitals:  Vitals Value Taken Time  BP    Temp    Pulse    Resp    SpO2      Last Pain:  Vitals:   11/21/20 1353  TempSrc:   PainSc: 4          Complications: No notable events documented.

## 2020-11-21 NOTE — Progress Notes (Signed)
Inpatient Diabetes Program Recommendations  AACE/ADA: New Consensus Statement on Inpatient Glycemic Control (2015)  Target Ranges:  Prepandial:   less than 140 mg/dL      Peak postprandial:   less than 180 mg/dL (1-2 hours)      Critically ill patients:  140 - 180 mg/dL   Lab Results  Component Value Date   GLUCAP 63 (L) 11/21/2020   HGBA1C 12.8 (H) 07/12/2020    Review of Glycemic Control Results for ARTEMIS, KOLLER (MRN [id number]) as of 11/21/2020 08:16  Ref. Range 11/20/2020 23:45 11/21/2020 04:13 11/21/2020 08:11  Glucose-Capillary Latest Ref Range: 70 - 99 mg/dL 825 (H) 003 (H) Novolog 2 units 63 (L)   Results for Bray, CARMACK (MRN [id number]) as of 11/21/2020 08:16  Ref. Range 11/21/2020 06:23  GFR, Estimated Latest Ref Range: >60 mL/min 16 (L)   Inpatient Diabetes Program Recommendations:    Please decrease correction to Novolog 0-6 units TID and HS  Will continue to follow while inpatient.  Thank you, Dulce Sellar, RN, BSN Diabetes Coordinator Inpatient Diabetes Program [phone] (team pager from 8a-5p)

## 2020-11-21 NOTE — Progress Notes (Addendum)
Patient briefly seen. VSS. Lungs CTA. RRR. Abd with normal BS, soft, moderate epigastric tenderness. Creatinine trending downward over the past 24 hours. Patient has not consumed anything by mouth since midnight. Heparin not given this morning.   Continues with nausea but vomiting improved. Possible hematemesis prior to admission. Upper abdominal pain still persistent. Intermittent heartburn. No dysphagia, melena, brbpr.  Needs EGD to evaluate symptoms when deemed stable from anesthesia standpoint, hopefully today.   Ann Moyer. [facility] [phone] 8/18/202210:26 AM

## 2020-11-21 NOTE — Progress Notes (Signed)
Shift Note: Patient is alert, oriented. Calmer today than yesterday. A PACU nurse came and started patient iv in right hand, patient very grateful, staff as well. Patient vitals have been stable. Fluids were restarted upon successful insertion. She has complained of nausea and pain today, medicated as ordered. Does want to eat, eager for test.

## 2020-11-21 NOTE — Anesthesia Preprocedure Evaluation (Signed)
Anesthesia Evaluation  Patient identified by MRN, date of birth, ID band Patient awake    Reviewed: Allergy & Precautions, NPO status , Patient's Chart, lab work & pertinent test results  History of Anesthesia Complications Negative for: history of anesthetic complications  Airway Mallampati: II  TM Distance: >3 FB Neck ROM: Full    Dental  (+) Missing, Chipped, Dental Advisory Given   Pulmonary asthma , Current Smoker and Patient abstained from smoking.,    Pulmonary exam normal breath sounds clear to auscultation       Cardiovascular hypertension, Pt. on medications Normal cardiovascular exam Rhythm:Regular Rate:Normal     Neuro/Psych PSYCHIATRIC DISORDERS Anxiety Depression    GI/Hepatic PUD, neg GERD  Medicated,  Endo/Other  diabetes, Poorly Controlled, Type 1, Insulin Dependent  Renal/GU Renal InsufficiencyRenal disease (AKI on CRI)     Musculoskeletal negative musculoskeletal ROS (+)   Abdominal   Peds  Hematology   Anesthesia Other Findings Intractable abdominal pain  Reproductive/Obstetrics negative OB ROS                            Anesthesia Physical Anesthesia Plan  ASA: 4  Anesthesia Plan: General   Post-op Pain Management:    Induction: Intravenous  PONV Risk Score and Plan: Propofol infusion  Airway Management Planned: Nasal Cannula and Natural Airway  Additional Equipment:   Intra-op Plan:   Post-operative Plan:   Informed Consent: I have reviewed the patients History and Physical, chart, labs and discussed the procedure including the risks, benefits and alternatives for the proposed anesthesia with the patient or authorized representative who has indicated his/her understanding and acceptance.     Dental advisory given  Plan Discussed with: CRNA and Surgeon  Anesthesia Plan Comments:         Anesthesia Quick Evaluation

## 2020-11-22 ENCOUNTER — Telehealth: Payer: Self-pay | Admitting: Gastroenterology

## 2020-11-22 ENCOUNTER — Encounter (HOSPITAL_COMMUNITY): Payer: Self-pay | Admitting: Internal Medicine

## 2020-11-22 LAB — BASIC METABOLIC PANEL
Anion gap: 6 (ref 5–15)
BUN: 31 mg/dL — ABNORMAL HIGH (ref 6–20)
CO2: 23 mmol/L (ref 22–32)
Calcium: 8.4 mg/dL — ABNORMAL LOW (ref 8.9–10.3)
Chloride: 109 mmol/L (ref 98–111)
Creatinine, Ser: 2.21 mg/dL — ABNORMAL HIGH (ref 0.44–1.00)
GFR, Estimated: 30 mL/min — ABNORMAL LOW (ref >60)
Glucose, Bld: 84 mg/dL (ref 70–99)
Potassium: 4.1 mmol/L (ref 3.5–5.1)
Sodium: 138 mmol/L (ref 135–145)

## 2020-11-22 LAB — GLUCOSE, CAPILLARY
Glucose-Capillary: 101 mg/dL — ABNORMAL HIGH (ref 70–99)
Glucose-Capillary: 134 mg/dL — ABNORMAL HIGH (ref 70–99)
Glucose-Capillary: 137 mg/dL — ABNORMAL HIGH (ref 70–99)

## 2020-11-22 NOTE — Telephone Encounter (Signed)
Ann Moyer, please arrange hospital follow-up in 3 months.  Dx: Nausea/vomiting, abdominal pain.

## 2020-11-22 NOTE — Progress Notes (Signed)
Reviewed D/C instructions with pt and mother at bedside, IV removed, no distress noted. Patient refused to wait for wheelchair, prefers to walk, mother ok with pt walking with her to d/c.

## 2020-11-22 NOTE — Discharge Summary (Signed)
Physician Discharge Summary  Ann Moyer JSE:[id number] DOB: 05-May-1990 DOA: 11/17/2020  [facility]: [facility]  Admit date: 11/17/2020 Discharge date: 11/22/2020  Admitted From: Home Disposition: Home   Recommendations for Outpatient Follow-up:  Follow up with PCP in 1-2 weeks  Follow up with GI in 3 months.  Home Health: None Equipment/Devices: None Discharge Condition: Stable CODE STATUS: Full Diet recommendation: Carb-modified  Brief/Interim Summary: Ann Moyer is a 30 y.o. female with a history of T1DM, cyclic vomiting, esophagitis, and opioid use disorder on maintenance suboxone who presented to the ED with abdominal pain, nausea and vomiting similar to prior presentations. CT abd/pelvis was largely reassuring with mild fat stranding along the distal descending colon. She was admitted with plans to undergo EGD though delayed by worsening renal function which has showed sustained improvement. EGD performed 8/18 was relatively unremarkable, so multifactorial motility disorder is suspected. The patient's symptoms have improved with supportive care including acid suppression, and she is stable for discharge.  Discharge Diagnoses:  Principal Problem:   Intractable abdominal pain Active Problems:   CKD (chronic kidney disease), stage II   Hypertension   Type 1 diabetes mellitus not at goal [facility])   Intractable nausea and vomiting   Cyclic vomiting syndrome   Non-intractable vomiting   Epigastric pain  Abdominal pain: Resolved. No specific source found at EGD.   Continue PPI twice daily. Continue Zofran every 8 hours as needed. Pt has history of some tremor with reglan and reportedly negative gastroscintigraphy, so this was not prescribed.  GI counseled on gastroparesis diet/lifestyle. Needs tight control of glucose. Needs avoid marijuana. Avoid NSAIDs and alcohol. Follow-up on surgical pathology from EGD 8/18.  Cyclic vomiting syndrome:  - Resolved, tolerated diet prior to  discharge. See recommendations above.    Acute renal failure on stage II CKD: Possibly related to diminished EABV, though contrast-induced nephropathy also possible.  - Continued to have sustained improvement with supportive treatment/IV fluids. Now able to maintain hydration status with enteral intake. Will need repeat BMP at follow up.    Marijuana use:  - Cessation counseling has been provided   Opioid use disorder: Has been in treatment for 8 years.  - Continue suboxone   T1DM: with hypoglycemia in setting of NPO status. Improved.  - Continue home medication Tx.  Discharge Instructions Discharge Instructions     Diet Carb Modified   Complete by: As directed    Discharge instructions   Complete by: As directed    You were evaluated for abdominal pain and vomiting. The EGD showed healing inflammation of the stomach that is not thought to be a significant finding causing your symptoms. Fortunately these symptoms have improved and you've tolerated a diet. You should continue all medications as you were taking them, avoid marijuana, NSAIDs, and alcohol, and follow up with your PCP in the next 1-2 weeks or seek medical attention right away if your symptoms return.      Allergies as of 11/22/2020       Reactions   Amitriptyline Swelling, Other (See Comments)   Legs and feet   Gabapentin Swelling   swelling   Pregabalin Swelling   swelling   Tramadol Itching, Swelling   Swelling, itching Eye and face swelling   Haloperidol Other (See Comments)   Uncontrollable movements  "uncontrolled movement of my body, like tremors"; previously charted to cause nausea and vomiting.     Toradol [ketorolac Tromethamine] Nausea Only   Ketorolac Itching   Metoclopramide Itching   Jittery and  itchy Jittery and itchy        Medication List     STOP taking these medications    amLODipine 10 MG tablet Commonly known as: NORVASC   fluconazole 100 MG tablet Commonly known as: DIFLUCAN    metoCLOPramide 5 MG tablet Commonly known as: Reglan   sucralfate 1 g tablet Commonly known as: CARAFATE       TAKE these medications    acetaminophen 325 MG tablet Commonly known as: TYLENOL Take 650 mg by mouth every 6 (six) hours as needed.   albuterol 108 (90 Base) MCG/ACT inhaler Commonly known as: VENTOLIN HFA Inhale 2 puffs into the lungs every 4 (four) hours as needed for wheezing or shortness of breath.   amphetamine-dextroamphetamine 20 MG tablet Commonly known as: ADDERALL Take 20 mg by mouth daily.   buprenorphine-naloxone 8-2 mg Subl SL tablet Commonly known as: SUBOXONE Place 0.5 tablets under the tongue in the morning, at noon, in the evening, and at bedtime.   busPIRone 10 MG tablet Commonly known as: BUSPAR Take 1 tablet (10 mg total) by mouth 3 (three) times daily.   etonogestrel 68 MG Impl implant Commonly known as: NEXPLANON 68 mg by Subdermal route once.   glucagon 1 MG injection Inject 1 mg into the vein once as needed (blood sugar).   insulin aspart 100 UNIT/ML FlexPen Commonly known as: NOVOLOG Inject 2-5 Units into the skin 2 (two) times daily before a meal. Sliding scale   insulin glargine 100 UNIT/ML Solostar Pen Commonly known as: LANTUS Inject 16 Units into the skin at bedtime.   Melatonin 10 MG Tabs Take 1 tablet by mouth daily.   OLANZapine 5 MG tablet Commonly known as: ZYPREXA Take 5 mg by mouth daily.   ondansetron 4 MG disintegrating tablet Commonly known as: ZOFRAN-ODT Take 4 mg by mouth every 8 (eight) hours as needed for nausea or vomiting.   pantoprazole 40 MG tablet Commonly known as: Protonix Take 1 tablet (40 mg total) by mouth 2 (two) times daily.   promethazine 25 MG tablet Commonly known as: PHENERGAN Take 25 mg by mouth every 4 (four) hours as needed for nausea or vomiting.   sertraline 50 MG tablet Commonly known as: ZOLOFT Take 1 tablet by mouth daily.   SUMAtriptan Succinate Refill 6 MG/0.5ML  Soct Inject 6 mg into the skin as needed (migraine).   tranexamic acid 650 MG Tabs tablet Commonly known as: LYSTEDA Take 1,300 mg by mouth 3 (three) times daily.        Follow-up Information     [facility] Schedule an appointment as soon as possible for a visit.   Contact information: [address] [place] [address] [phone]                Allergies  Allergen Reactions   Amitriptyline Swelling and Other (See Comments)    Legs and feet    Gabapentin Swelling    swelling   Pregabalin Swelling    swelling   Tramadol Itching and Swelling    Swelling, itching Eye and face swelling    Haloperidol Other (See Comments)    Uncontrollable movements  "uncontrolled movement of my body, like tremors"; previously charted to cause nausea and vomiting.     Toradol [Ketorolac Tromethamine] Nausea Only   Ketorolac Itching   Metoclopramide Itching    Jittery and itchy Jittery and itchy     Consultations: GI  Procedures/Studies: CT ABDOMEN PELVIS W CONTRAST  Result Date: 11/18/2020  CLINICAL DATA:  Abdominal pain with nausea and vomiting EXAM: CT ABDOMEN AND PELVIS WITH CONTRAST TECHNIQUE: Multidetector CT imaging of the abdomen and pelvis was performed using the standard protocol following bolus administration of intravenous contrast. CONTRAST:  80mL OMNIPAQUE IOHEXOL 300 MG/ML  SOLN COMPARISON:  07/12/2020 FINDINGS: LOWER CHEST: Normal. HEPATOBILIARY: Normal hepatic contours. No intra- or extrahepatic biliary dilatation. The gallbladder is normal. PANCREAS: Normal pancreas. No ductal dilatation or peripancreatic fluid collection. SPLEEN: Normal. ADRENALS/URINARY TRACT: The adrenal glands are normal. No hydronephrosis, nephroureterolithiasis or solid renal mass. The urinary bladder is normal for degree of distention STOMACH/BOWEL: There is no hiatal hernia. Normal duodenal course and caliber. No small bowel dilatation or inflammation. Mild fat stranding  adjacent to the distal descending colon. Normal appendix. VASCULAR/LYMPHATIC: Normal course and caliber of the major abdominal vessels. No abdominal or pelvic lymphadenopathy. REPRODUCTIVE: Normal uterus. No adnexal mass. MUSCULOSKELETAL. No bony spinal canal stenosis or focal osseous abnormality. OTHER: None. IMPRESSION: Mild fat stranding adjacent to the distal descending colon, which may indicate mild colitis. Electronically Signed   By: Deatra RobinsonKevin  Herman M.D.   On: 11/18/2020 02:32   US RENAL  Result Date: 11/20/2020 CLINICAL DATA:  Acute kidney injury EXAM: RENAL / URINARY TRACT ULTRASOUND COMPLETE COMPARISON:  No prior ultrasound, correlation is made to CT abdomen pelvis 11/18/2020 FINDINGS: Right Kidney: Renal measurements: 11.8 x 4.0 x 5 3 cm = volume: 132 mL. Echogenicity within normal limits. No mass or hydronephrosis visualized. Left Kidney: Renal measurements: 11.6 x 4.5 x 4.8 cm = volume: 128 mL. Echogenicity within normal limits. No mass or hydronephrosis visualized. Bladder: The bladder is not well distended, limiting evaluation, although the wall does appear somewhat irregular, and measures to 0.8 cm. Other: None. IMPRESSION: 1. Normal kidneys bilaterally.  No hydronephrosis or mass. 2. Mild bladder wall irregularity, which may be due to underdistension of the bladder. Electronically Signed   By: Wiliam KeAlison  Vasan M.D.   On: 11/20/2020 14:30    Findings:      The examined esophagus was normal. Stomach had about 200 cc of bile       stained fluid which was easily suctioned out. Patchy erythema and a       couple of body and antral erosions present. No ulcer or infiltrating       process. Pylorus patent.      The duodenal bulb and second portion of the duodenum were normal.       Biopsies of the abnormal gastric mucosa taken for histologic study. Impression:       - Normal esophagus. Patchy gastric erosions and                            erythema of doubtful clinical significance?"status                             post biopsy                           - Normal duodenal bulb and second portion of the                            duodenum.                           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  Recommendation: - Patient has a contact number available for                            emergencies. The signs and symptoms of potential                            delayed complications were discussed with the                            patient. Return to normal activities tomorrow.                            Written discharge instructions were provided to the                            patient.                           Carbohydrate modified bariatric type diet. Continue                            acid suppression therapy. Follow-up on pathology.  Subjective: Frustrated with dietary restrictions, though has tolerated some per oral intake without vomiting or abdominal pain returning. We agreed to push diet forward and she tolerated lunch without issues.  Discharge Exam: Vitals:   11/22/20 0439 11/22/20 0649  BP: 135/86 (!) 143/97  Pulse: 81 86  Resp: 19 19  Temp: 98.5 F (36.9 C) 98.6 F (37 C)  SpO2: 99% 100%   General: Pt is alert, awake, not in acute distress Cardiovascular: RRR, S1/S2 +, no rubs, no gallops Respiratory: CTA bilaterally, no wheezing, no rhonchi Abdominal: Soft, NT, ND, bowel sounds + Extremities: No edema, no cyanosis  Labs: BNP (last 3 results) No results for input(s): BNP in the last 8760 hours. Basic Metabolic Panel: Recent Labs  Lab 11/19/20 0453 11/20/20 0753 11/20/20 1610 11/21/20 0623 11/22/20 0757  NA 135 132* 131* 133* 138  K 3.5 3.8 3.9 3.9 4.1  CL 101 106 104 104 109  CO2 26 22 21* 22 23  GLUCOSE 120* 78 130* 113* 84  BUN 31* 39* 41* 44* 31*  CREATININE 2.84* 4.40* 4.28* 3.83* 2.21*  CALCIUM 8.8* 8.0* 8.4* 8.4* 8.4*  MG 1.8  --   --   --   --   PHOS  --   --  4.6 4.5   --    Liver Function Tests: Recent Labs  Lab 11/17/20 2330 11/19/20 0453 11/20/20 1610 11/21/20 0623  AST 30 25  --   --   ALT 27 20  --   --   ALKPHOS 158* 119  --   --   BILITOT 0.5 0.4  --   --   PROT 7.3 5.7*  --   --   ALBUMIN 3.5 3.0* 3.5 2.9*   Recent Labs  Lab 11/17/20 2330  LIPASE 21   No results for input(s): AMMONIA in the last 168 hours. CBC: Recent Labs  Lab 11/17/20 2330 11/19/20 0453 11/20/20  1610  WBC 12.0* 9.5 10.5  HGB 12.8 11.0* 11.3*  HCT 36.7 32.8* 33.6*  MCV 86.2 90.1 88.7  PLT 206 210 222   Cardiac Enzymes: No results for input(s): CKTOTAL, CKMB, CKMBINDEX, TROPONINI in the last 168 hours. BNP: Invalid input(s): POCBNP CBG: Recent Labs  Lab 11/21/20 1727 11/21/20 2121 11/22/20 0655 11/22/20 0724 11/22/20 1105  GLUCAP 246* 199* 134* 101* 137*   D-Dimer No results for input(s): DDIMER in the last 72 hours. Hgb A1c No results for input(s): HGBA1C in the last 72 hours. Lipid Profile No results for input(s): CHOL, HDL, LDLCALC, TRIG, CHOLHDL, LDLDIRECT in the last 72 hours. Thyroid function studies No results for input(s): TSH, T4TOTAL, T3FREE, THYROIDAB in the last 72 hours.  Invalid input(s): FREET3 Anemia work up No results for input(s): VITAMINB12, FOLATE, FERRITIN, TIBC, IRON, RETICCTPCT in the last 72 hours. Urinalysis    Component Value Date/Time   COLORURINE YELLOW 11/20/2020 1031   APPEARANCEUR HAZY (A) 11/20/2020 1031   LABSPEC 1.011 11/20/2020 1031   PHURINE 6.0 11/20/2020 1031   GLUCOSEU NEGATIVE 11/20/2020 1031   HGBUR SMALL (A) 11/20/2020 1031   BILIRUBINUR NEGATIVE 11/20/2020 1031   KETONESUR NEGATIVE 11/20/2020 1031   PROTEINUR 100 (A) 11/20/2020 1031   NITRITE NEGATIVE 11/20/2020 1031   LEUKOCYTESUR NEGATIVE 11/20/2020 1031    Microbiology Recent Results (from the past 240 hour(s))  SARS CORONAVIRUS 2 (TAT 6-24 HRS) Nasopharyngeal Nasopharyngeal Swab     Status: None   Collection Time: 11/18/20  5:10 AM    Specimen: Nasopharyngeal Swab  Result Value Ref Range Status   SARS Coronavirus 2 NEGATIVE NEGATIVE Final    Comment: (NOTE) SARS-CoV-2 target nucleic acids are NOT DETECTED.  The SARS-CoV-2 RNA is generally detectable in upper and lower respiratory specimens during the acute phase of infection. Negative results do not preclude SARS-CoV-2 infection, do not rule out co-infections with other pathogens, and should not be used as the sole basis for treatment or other patient management decisions. Negative results must be combined with clinical observations, patient history, and epidemiological information. The expected result is Negative.  Fact Sheet for Patients: HairSlick.no  Fact Sheet for Healthcare Providers: quierodirigir.com  This test is not yet approved or cleared by the [place] FDA and  has been authorized for detection and/or diagnosis of SARS-CoV-2 by FDA under an Emergency Use Authorization (EUA). This EUA will remain  in effect (meaning this test can be used) for the duration of the COVID-19 declaration under Se ction 564(b)(1) of the Act, 21 U.S.C. section 360bbb-3(b)(1), unless the authorization is terminated or revoked sooner.  Performed at [facility], [address] [place], [place] [address]     Time coordinating discharge: Approximately 40 minutes  Tyrone Nine, MD  [facility] 11/22/2020, 11:29 AM

## 2020-11-22 NOTE — Progress Notes (Signed)
Subjective: Feeling much improved today.  Minimal abdominal pressure in the epigastric area.  No nausea.  Tolerating regular diet well.  Bowels are moving well.  Denies brbpr or melena.  She tells me her issues with recurrent abdominal pain, nausea, and vomiting started back in 2014.  States she had gastric emptying study at [facility] and was told it was abnormal.  She had follow-up gastric emptying study x2 at [facility] and was told they were normal.  Reports being on Reglan in the past which caused "twitching".  She is expecting discharge today.  Objective: Vital signs in last 24 hours: Temp:  [96.8 F (36 C)-98.6 F (37 C)] 98.6 F (37 C) (08/19 0649) Pulse Rate:  [70-98] 86 (08/19 0649) Resp:  [9-29] 19 (08/19 0649) BP: (101-163)/(65-103) 143/97 (08/19 0649) SpO2:  [96 %-100 %] 100 % (08/19 0649)   General:   Alert and oriented, pleasant Head:  Normocephalic and atraumatic. Eyes:  No icterus, sclera clear. Conjuctiva pink.  Abdomen:  Bowel sounds present, soft, non-tender, non-distended. No HSM or hernias noted. No rebound or guarding. No masses appreciated  Msk:  Symmetrical without gross deformities. Normal posture. Extremities:  Without edema. Neurologic:  Alert and  oriented x4;  grossly normal neurologically. Psych:  Normal mood and affect.  Intake/Output from previous day: 08/18 0701 - 08/19 0700 In: 1440.7 [P.O.:690; I.V.:750.7] Out: 300 [Urine:300] Intake/Output this shift: No intake/output data recorded.  Lab Results: Recent Labs    11/20/20 1610  WBC 10.5  HGB 11.3*  HCT 33.6*  PLT 222   BMET Recent Labs    11/20/20 1610 11/21/20 0623 11/22/20 0757  NA 131* 133* 138  K 3.9 3.9 4.1  CL 104 104 109  CO2 21* 22 23  GLUCOSE 130* 113* 84  BUN 41* 44* 31*  CREATININE 4.28* 3.83* 2.21*  CALCIUM 8.4* 8.4* 8.4*   LFT Recent Labs    11/20/20 1610 11/21/20 0623  ALBUMIN 3.5 2.9*    Studies/Results: US RENAL  Result Date: 11/20/2020 CLINICAL DATA:   Acute kidney injury EXAM: RENAL / URINARY TRACT ULTRASOUND COMPLETE COMPARISON:  No prior ultrasound, correlation is made to CT abdomen pelvis 11/18/2020 FINDINGS: Right Kidney: Renal measurements: 11.8 x 4.0 x 5 3 cm = volume: 132 mL. Echogenicity within normal limits. No mass or hydronephrosis visualized. Left Kidney: Renal measurements: 11.6 x 4.5 x 4.8 cm = volume: 128 mL. Echogenicity within normal limits. No mass or hydronephrosis visualized. Bladder: The bladder is not well distended, limiting evaluation, although the wall does appear somewhat irregular, and measures to 0.8 cm. Other: None. IMPRESSION: 1. Normal kidneys bilaterally.  No hydronephrosis or mass. 2. Mild bladder wall irregularity, which may be due to underdistension of the bladder. Electronically Signed   By: Wiliam Ke M.D.   On: 11/20/2020 14:30    Assessment: 30 year old female with history of type 1 diabetes, intermittent nausea/vomiting, recurrent upper abdominal pain, chronic marijuana use, history of opioid use disorder and followed by [facility] with Suboxone prescribed, possible gastroparesis with abnormal gastric emptying study in 2014, but follow-up gastric emptying studies have been normal, recently inpatient April 2022 with abdominal pain, N/V and undergoing EGD at that time with LA Grade D erosive esophagitis without bleeding, gastritis s/p biopsy, normal duodenum. Found to have candida esophagitis at that time. Repeat EGD had been recommended. She presented on 8/14 with recurrent acute onset diffuse abdominal pain, associated N/V, possible hematemesis per patient reported. CT abd/pelvis with mild fat stranding adjacent to distal  descending colon. Mild leukocytosis with WBC count 12 on admission. Lipase normal.   Now s/p EGD on 8/18 with normal esophagus, patchy gastric erosions and erythema of doubtful clinical significance s/p biopsy, normal examined duodenum.  Clinically, she is much improved with resolution of nausea and  vomiting and little to no abdominal pain, tolerating regular diet well.  Etiology is likely multifactorial.  Suspect component of gastroparesis in the setting of uncontrolled diabetes.  May also have cannabis hyperemesis syndrome playing a role as well.  Considering abdominal pain and gallbladder in situ, could have biliary dyskinesia. As she is doing well, no need for additional evaluation at this time.  She is stable for discharge with outpatient follow-up.  Mild fat stranding adjacent to distal descending colon: non-specific.  2 episodes of watery stool on admission, now resolved. No need for additional evaluation at this time. Follow clinically.    Plan: Continue regular diet. Continue PPI twice daily. Continue Zofran every 8 hours as needed. Counseled on gastroparesis diet/lifestyle. Needs tight control of glucose. Needs avoid marijuana. Avoid NSAIDs. Follow-up on surgical pathology. Stable for discharge from GI standpoint.  We will arrange outpatient follow-up.    LOS: 4 days    11/22/2020, 10:42 AM   Ermalinda Memos, PA-C [facility]

## 2020-11-25 ENCOUNTER — Encounter: Payer: Self-pay | Admitting: Internal Medicine

## 2020-11-25 LAB — SURGICAL PATHOLOGY

## 2020-11-27 ENCOUNTER — Encounter: Payer: Self-pay | Admitting: Internal Medicine

## 2020-12-16 ENCOUNTER — Other Ambulatory Visit: Payer: Self-pay

## 2020-12-16 ENCOUNTER — Emergency Department (HOSPITAL_COMMUNITY)
Admission: EM | Admit: 2020-12-16 | Discharge: 2020-12-16 | Disposition: A | Discharge status: Home / Self Care | Payer: Medicaid Other | Attending: Emergency Medicine | Admitting: Emergency Medicine

## 2020-12-16 ENCOUNTER — Encounter (HOSPITAL_COMMUNITY): Payer: Self-pay | Admitting: *Deleted

## 2020-12-16 DIAGNOSIS — I129 Hypertensive chronic kidney disease with stage 1 through stage 4 chronic kidney disease, or unspecified chronic kidney disease: Secondary | ICD-10-CM | POA: Insufficient documentation

## 2020-12-16 DIAGNOSIS — F17210 Nicotine dependence, cigarettes, uncomplicated: Secondary | ICD-10-CM | POA: Insufficient documentation

## 2020-12-16 DIAGNOSIS — R197 Diarrhea, unspecified: Secondary | ICD-10-CM | POA: Insufficient documentation

## 2020-12-16 DIAGNOSIS — Z28310 Unvaccinated for covid-19: Secondary | ICD-10-CM | POA: Insufficient documentation

## 2020-12-16 DIAGNOSIS — R112 Nausea with vomiting, unspecified: Secondary | ICD-10-CM | POA: Insufficient documentation

## 2020-12-16 DIAGNOSIS — Z79899 Other long term (current) drug therapy: Secondary | ICD-10-CM | POA: Insufficient documentation

## 2020-12-16 DIAGNOSIS — N182 Chronic kidney disease, stage 2 (mild): Secondary | ICD-10-CM | POA: Insufficient documentation

## 2020-12-16 DIAGNOSIS — R1084 Generalized abdominal pain: Secondary | ICD-10-CM | POA: Insufficient documentation

## 2020-12-16 DIAGNOSIS — E1022 Type 1 diabetes mellitus with diabetic chronic kidney disease: Secondary | ICD-10-CM | POA: Insufficient documentation

## 2020-12-16 DIAGNOSIS — Z794 Long term (current) use of insulin: Secondary | ICD-10-CM | POA: Insufficient documentation

## 2020-12-16 DIAGNOSIS — J45909 Unspecified asthma, uncomplicated: Secondary | ICD-10-CM | POA: Insufficient documentation

## 2020-12-16 LAB — CBG MONITORING, ED: Glucose-Capillary: 307 mg/dL — ABNORMAL HIGH (ref 70–99)

## 2020-12-16 LAB — COMPREHENSIVE METABOLIC PANEL
ALT: 25 U/L (ref 0–44)
AST: 28 U/L (ref 15–41)
Albumin: 3.7 g/dL (ref 3.5–5.0)
Alkaline Phosphatase: 186 U/L — ABNORMAL HIGH (ref 38–126)
Anion gap: 9 (ref 5–15)
BUN: 24 mg/dL — ABNORMAL HIGH (ref 6–20)
CO2: 25 mmol/L (ref 22–32)
Calcium: 9 mg/dL (ref 8.9–10.3)
Chloride: 101 mmol/L (ref 98–111)
Creatinine, Ser: 1.29 mg/dL — ABNORMAL HIGH (ref 0.44–1.00)
GFR, Estimated: 57 mL/min — ABNORMAL LOW (ref >60)
Glucose, Bld: 313 mg/dL — ABNORMAL HIGH (ref 70–99)
Potassium: 4.1 mmol/L (ref 3.5–5.1)
Sodium: 135 mmol/L (ref 135–145)
Total Bilirubin: 0.6 mg/dL (ref 0.3–1.2)
Total Protein: 7.9 g/dL (ref 6.5–8.1)

## 2020-12-16 LAB — CBC WITH DIFFERENTIAL/PLATELET
Abs Immature Granulocytes: 0.04 10*3/uL (ref 0.00–0.07)
Basophils Absolute: 0.1 10*3/uL (ref 0.0–0.1)
Basophils Relative: 1 %
Eosinophils Absolute: 0 10*3/uL (ref 0.0–0.5)
Eosinophils Relative: 0 %
HCT: 37.6 % (ref 36.0–46.0)
Hemoglobin: 12.7 g/dL (ref 12.0–15.0)
Immature Granulocytes: 0 %
Lymphocytes Relative: 6 %
Lymphs Abs: 0.6 10*3/uL — ABNORMAL LOW (ref 0.7–4.0)
MCH: 30 pg (ref 26.0–34.0)
MCHC: 33.8 g/dL (ref 30.0–36.0)
MCV: 88.9 fL (ref 80.0–100.0)
Monocytes Absolute: 0.2 10*3/uL (ref 0.1–1.0)
Monocytes Relative: 2 %
Neutro Abs: 8.9 10*3/uL — ABNORMAL HIGH (ref 1.7–7.7)
Neutrophils Relative %: 91 %
Platelets: 231 10*3/uL (ref 150–400)
RBC: 4.23 MIL/uL (ref 3.87–5.11)
RDW: 12.1 % (ref 11.5–15.5)
WBC: 9.8 10*3/uL (ref 4.0–10.5)
nRBC: 0 % (ref 0.0–0.2)

## 2020-12-16 LAB — LIPASE, BLOOD: Lipase: 22 U/L (ref 11–51)

## 2020-12-16 MED ORDER — SODIUM CHLORIDE 0.9 % IV BOLUS
1000.0000 mL | Freq: Once | INTRAVENOUS | Status: AC
Start: 2020-12-16 — End: 2020-12-16
  Administered 2020-12-16: 1000 mL via INTRAVENOUS

## 2020-12-16 MED ORDER — ONDANSETRON HCL 4 MG/2ML IJ SOLN
4.0000 mg | Freq: Once | INTRAMUSCULAR | Status: AC
  Administered 2020-12-16: 4 mg via INTRAVENOUS
  Filled 2020-12-16: qty 2

## 2020-12-16 MED ORDER — HYDROMORPHONE HCL 1 MG/ML IJ SOLN
1.0000 mg | Freq: Once | INTRAMUSCULAR | Status: AC
Start: 2020-12-16 — End: 2020-12-16
  Administered 2020-12-16: 1 mg via INTRAVENOUS
  Filled 2020-12-16: qty 1

## 2020-12-16 MED ORDER — PROMETHAZINE HCL 12.5 MG RE SUPP: 12.5000 mg | Freq: Four times a day (QID) | RECTAL | 0 refills | Status: DC | PRN

## 2020-12-16 MED ORDER — DICYCLOMINE HCL 10 MG/ML IM SOLN
20.0000 mg | Freq: Once | INTRAMUSCULAR | Status: AC
  Administered 2020-12-16: 20 mg via INTRAMUSCULAR
  Filled 2020-12-16: qty 2

## 2020-12-16 MED ORDER — LORAZEPAM 2 MG/ML IJ SOLN
0.5000 mg | Freq: Once | INTRAMUSCULAR | Status: AC
  Administered 2020-12-16: 0.5 mg via INTRAVENOUS
  Filled 2020-12-16: qty 1

## 2020-12-16 MED ORDER — HYDROMORPHONE HCL 1 MG/ML IJ SOLN
0.5000 mg | Freq: Once | INTRAMUSCULAR | Status: AC
  Administered 2020-12-16: 0.5 mg via INTRAVENOUS
  Filled 2020-12-16: qty 1

## 2020-12-16 MED ORDER — OMEPRAZOLE 20 MG PO CPDR: 20.0000 mg | DELAYED_RELEASE_CAPSULE | Freq: Every day | ORAL | 0 refills | Status: DC

## 2020-12-16 MED ORDER — DICYCLOMINE HCL 20 MG PO TABS: 20.0000 mg | ORAL_TABLET | Freq: Two times a day (BID) | ORAL | 0 refills | Status: DC

## 2020-12-16 NOTE — ED Triage Notes (Signed)
Abdominal pain with nausea and vomiting.

## 2020-12-16 NOTE — ED Provider Notes (Signed)
Ann Moyer Provider Note   CSN: [id number] Arrival date & time: 12/16/20  1640     History Chief Complaint  Patient presents with   Abdominal Pain    Ann Moyer is a 30 y.o. female.  HPI  Patient with significant medical history of diabetes type 1, cyclic vomiting, headaches, opioid use presents with chief complaint of nausea, vomit, abdominal pain.  Patient states this started yesterday morning, came on suddenly, states stomach  pain is all over, does not radiate, no associated urinary symptoms, she denies coffee-ground emesis, hematemesis, denies melena or hematochezia.  She has no associated fevers, chills, chest pain, shortness of breath, general body aches, she denies recent sick contacts, not vaccinated to COVID-19.  Patient has no significant abdominal history, denies history of PUD, NSAID use, alcohol use, denies history of diverticulitis, pancreatitis, bowel obstruction.  Patient does admit to marijuana use, last use a few days ago.  After reviewing patient's chart patient was seen here on 08/14 for similar presentation she had an EDG performed which was unremarkable.   Nursing staff informing that patient recently got back from a music festival where she endorsed drinking heavily.  Past Medical History:  Diagnosis Date   Asthma    Borderline personality disorder ([place])    CKD (chronic kidney disease)    Diabetes mellitus without complication ([place])    Opioid use disorder     Patient Active Problem List   Diagnosis Date Noted   Epigastric pain    Non-intractable vomiting    Intractable abdominal pain 11/18/2020   Intractable nausea and vomiting 32/44/0102   Cyclic vomiting syndrome 72/53/6644   Uncomplicated opioid dependence ([place])    Depression with anxiety    Intractable vomiting    Esophagitis  07/14/2020   AKI (acute kidney injury) ([place]) 07/13/2020   Borderline personality disorder ([place]) 07/12/2020   Type 1 diabetes mellitus not at  [facility]) 07/12/2020   Hypertension 09/12/2018   CKD (chronic kidney disease), 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) 06/02/2012   Diabetic gastroparesis ([place]) 09/10/2011   Mild intermittent asthma, uncomplicated 03/47/4259    Past Surgical History:  Procedure Laterality Date   BIOPSY  07/13/2020   Procedure: BIOPSY;  Surgeon: Eloise Harman, DO;  [facility];  Service: Endoscopy;;  gastric, esophageal   BIOPSY  11/21/2020   Procedure: BIOPSY;  Surgeon: Daneil Dolin, MD;  Location: [facility];  Service: Endoscopy;;   ESOPHAGOGASTRODUODENOSCOPY (EGD) WITH PROPOFOL N/A 07/13/2020   LA Grade D erosive esophagitis without bleeding, gastritis s/p biopsy, normal duodenum. Found to have candida esophagitis at that time   ESOPHAGOGASTRODUODENOSCOPY (EGD) WITH PROPOFOL N/A 11/21/2020   Procedure: ESOPHAGOGASTRODUODENOSCOPY (EGD) WITH PROPOFOL;  Surgeon: Daneil Dolin, MD;  Location: [facility];  Service: Endoscopy;  Laterality: N/A;   EYE SURGERY       OB History   No obstetric history on file.     No family history on file.  Social History   Tobacco Use   Smoking status: Every Day    Packs/day: 0.50    Types: Cigarettes   Smokeless tobacco: Never  Substance Use Topics   Alcohol use: Not Currently   Drug use: Yes    Types: Marijuana    Comment: daily    Home Medications Prior to Admission medications   Medication Sig Start Date End Date Taking? Authorizing Provider  albuterol (VENTOLIN HFA) 108 (  90 Base) MCG/ACT inhaler Inhale 2 puffs into the lungs every 4 (four) hours as needed for wheezing or shortness of breath. 01/26/13  Yes [provider]  amphetamine-dextroamphetamine (ADDERALL) 20 MG tablet Take 20 mg by mouth daily. 06/10/20  Yes [provider]  buprenorphine-naloxone (SUBOXONE) 8-2 mg SUBL SL tablet Place 0.5 tablets under the tongue in the morning, at noon,  in the evening, and at bedtime. 07/10/20  Yes [provider]  busPIRone (BUSPAR) 10 MG tablet Take 1 tablet (10 mg total) by mouth 3 (three) times daily. 07/16/20  Yes Emokpae, Courage, MD  dicyclomine (BENTYL) 20 MG tablet Take 1 tablet (20 mg total) by mouth 2 (two) times daily. 12/16/20  Yes Marcello Fennel, PA-C  etonogestrel (NEXPLANON) 68 MG IMPL implant 68 mg by Subdermal route once. 04/03/20  Yes [provider]  glucagon 1 MG injection Inject 1 mg into the vein once as needed (blood sugar). 08/12/10  Yes [provider]  insulin aspart (NOVOLOG) 100 UNIT/ML FlexPen Inject 2-5 Units into the skin 2 (two) times daily before a meal. Sliding scale 04/03/20  Yes [provider]  insulin glargine (LANTUS) 100 UNIT/ML Solostar Pen Inject 16 Units into the skin at bedtime. 02/08/20  Yes [provider]  OLANZapine (ZYPREXA) 5 MG tablet Take 5 mg by mouth daily. 07/10/20 12/16/20 Yes [provider]  omeprazole (PRILOSEC) 20 MG capsule Take 1 capsule (20 mg total) by mouth daily. 12/16/20 01/15/21 Yes Marcello Fennel, PA-C  ondansetron (ZOFRAN-ODT) 4 MG disintegrating tablet Take 4 mg by mouth every 8 (eight) hours as needed for nausea or vomiting. 07/10/20  Yes [provider]  pantoprazole (PROTONIX) 40 MG tablet Take 1 tablet (40 mg total) by mouth 2 (two) times daily. 07/16/20 07/16/21 Yes Emokpae, Courage, MD  promethazine (PHENERGAN) 12.5 MG suppository Place 1 suppository (12.5 mg total) rectally every 6 (six) hours as needed for up to 12 doses for nausea or vomiting. 12/16/20  Yes Marcello Fennel, PA-C  sertraline (ZOLOFT) 50 MG tablet Take 1 tablet by mouth daily. 07/28/19 12/16/20 Yes [provider]  SUMAtriptan Succinate Refill 6 MG/0.5ML SOCT Inject 6 mg into the skin as needed (migraine). 09/27/19 12/16/20 Yes [provider]  acetaminophen (TYLENOL) 325 MG tablet Take 650 mg by mouth every 6 (six) hours as  needed. Patient not taking: Reported on 12/16/2020 05/28/14   [provider]  Melatonin 10 MG TABS Take 1 tablet by mouth daily. Patient not taking: Reported on 12/16/2020    [provider]  tranexamic acid (LYSTEDA) 650 MG TABS tablet Take 1,300 mg by mouth 3 (three) times daily. Patient not taking: Reported on 12/16/2020 02/01/20   [provider]    Allergies    Amitriptyline, Gabapentin, Pregabalin, Tramadol, Haloperidol, Toradol [ketorolac tromethamine], Ketorolac, and Metoclopramide  Review of Systems   Review of Systems  Constitutional:  Negative for chills and fever.  HENT:  Negative for congestion.   Respiratory:  Negative for shortness of breath.   Cardiovascular:  Negative for chest pain.  Gastrointestinal:  Positive for abdominal pain, diarrhea, nausea and vomiting.  Genitourinary:  Negative for enuresis.  Musculoskeletal:  Negative for back pain.  Skin:  Negative for rash.  Neurological:  Negative for dizziness.  Hematological:  Does not bruise/bleed easily.   Physical Exam Updated Vital Signs BP (!) 156/94   Pulse 97   Temp 98.3 F (36.8 C)   Resp 18   Ht 5' 5"  (1.651 m)  LMP 12/16/2020   SpO2 100%   BMI 18.30 kg/m   Physical Exam Vitals and nursing note reviewed.  Constitutional:      General: She is in acute distress.     Appearance: She is not ill-appearing.     Comments: Appear to be in acute distress in a fetal position actively vomiting on my exam  HENT:     Head: Normocephalic and atraumatic.     Nose: No congestion.  Eyes:     Conjunctiva/sclera: Conjunctivae normal.  Cardiovascular:     Rate and Rhythm: Normal rate and regular rhythm.     Pulses: Normal pulses.     Heart sounds: No murmur heard.   No friction rub. No gallop.  Pulmonary:     Effort: No respiratory distress.     Breath sounds: No wheezing, rhonchi or rales.  Abdominal:     General: There is no distension.     Palpations: Abdomen is soft.      Tenderness: There is abdominal tenderness. There is no right CVA tenderness or left CVA tenderness.     Comments: Abdomen nondistended normal bowel sounds, dull to percussion, generalized abdominal tenderness, no guarding, rebound tenderness, peritoneal sign.  Musculoskeletal:     Right lower leg: No edema.     Left lower leg: No edema.  Skin:    General: Skin is warm and dry.  Neurological:     Mental Status: She is alert.  Psychiatric:        Mood and Affect: Mood normal.    ED Results / Procedures / Treatments   Labs (all labs ordered are listed, but only abnormal results are displayed) Labs Reviewed  COMPREHENSIVE METABOLIC PANEL - Abnormal; Notable for the following components:      Result Value   Glucose, Bld 313 (*)    BUN 24 (*)    Creatinine, Ser 1.29 (*)    Alkaline Phosphatase 186 (*)    GFR, Estimated 57 (*)    All other components within normal limits  CBC WITH DIFFERENTIAL/PLATELET - Abnormal; Notable for the following components:   Neutro Abs 8.9 (*)    Lymphs Abs 0.6 (*)    All other components within normal limits  CBG MONITORING, ED - Abnormal; Notable for the following components:   Glucose-Capillary 307 (*)    All other components within normal limits  LIPASE, BLOOD  PREGNANCY, URINE  URINALYSIS, ROUTINE W REFLEX MICROSCOPIC  RAPID URINE DRUG SCREEN, HOSP PERFORMED    EKG None  Radiology No results found.  Procedures Procedures   Medications Ordered in ED Medications  sodium chloride 0.9 % bolus 1,000 mL (0 mLs Intravenous Stopped 12/16/20 2124)  ondansetron (ZOFRAN) injection 4 mg (4 mg Intravenous Given 12/16/20 2006)  dicyclomine (BENTYL) injection 20 mg (20 mg Intramuscular Given 12/16/20 2008)  HYDROmorphone (DILAUDID) injection 1 mg (1 mg Intravenous Given 12/16/20 2006)  HYDROmorphone (DILAUDID) injection 0.5 mg (0.5 mg Intravenous Given 12/16/20 2121)  LORazepam (ATIVAN) injection 0.5 mg (0.5 mg Intravenous Given 12/16/20 2211)    ED  Course  I have reviewed the triage vital signs and the nursing notes.  Pertinent labs & imaging results that were available during my care of the patient were reviewed by me and considered in my medical decision making (see chart for details).    MDM Rules/Calculators/A&P                          Initial impression-patient presents  with abdominal tenderness.  Nausea vomiting she is alert, patient in acute distress, vital signs reassuring.  Will start patient on fluids, antiemetics, pain medications, obtain basic lab work-up, obtain CT imaging and reassess.  Work-up-CBC unremarkable, CMP shows slight hyperglycemia with 313 elevated BUN 24, creatinine 1.29, alk phos slightly elevated at 186, GFR 57.  Lipase is 22.  Reassessment-patient was reassessed states she still has stomach pain will provide patient with an additional dose of narcotic medication.  Inform that the patient has had another episode of vomiting reassessed the patient states her pain has improved will provide her with Ativan and reassess.  Patient was reassessed, vital signs remained stable, she is resting comfortably.  Rule out-I have low suspicion for systemic infection as patient is nontoxic-appearing, vital signs reassuring.  I have low suspicion for pancreatitis as lipase is within normal limits.  I have low suspicion for liver or gallbladder abnormality as she has no elevation in liver enzymes, T bili she has no right upper quadrant tenderness negative Murphy sign.  I have low suspicion for bowel obstruction as abdomen is nondistended dull to percussion patient still passing gas and having bowel movements.  I have low suspicion for complicated diverticulitis as this patient is nontoxic-appearing, no leukocytosis present.  Low suspicion for beorhaave as presentation is atypical of etiology.  Low suspicion for perforated stomach ulcer as no peritoneal sign present my exam.  I have low suspicion for DKA as she has no anion gap, no  creasing CO2.    Plan-  Stomach pain vomiting since resolved-likely this result of gastritis, will provide her with antiemetics, Bentyl, PPI follow-up with PCP as needed.  Vital signs have remained stable, no indication for hospital admission.  Patient given at home care as well strict return precautions.  Patient verbalized that they understood agreed to said plan.  Final Clinical Impression(s) / ED Diagnoses Final diagnoses:  Generalized abdominal pain  Nausea vomiting and diarrhea    Rx / DC Orders ED Discharge Orders          Ordered    dicyclomine (BENTYL) 20 MG tablet  2 times daily        12/16/20 2303    omeprazole (PRILOSEC) 20 MG capsule  Daily        12/16/20 2303    promethazine (PHENERGAN) 12.5 MG suppository  Every 6 hours PRN        12/16/20 2303             Marcello Fennel, PA-C 12/16/20 2306    Varney Biles, MD 12/17/20 1132

## 2020-12-16 NOTE — Discharge Instructions (Addendum)
Lab work and imaging are all reassuring given you prescription for Bentyl please use needed for stomach spasms.  Phenergan please use as needed for nausea.  Also given you an acid pill please take as prescribed.  Please follow-up your PCP for further evaluation.  Come back to the emergency department if you develop chest pain, shortness of breath, severe abdominal pain, uncontrolled nausea, vomiting, diarrhea.

## 2020-12-17 ENCOUNTER — Other Ambulatory Visit: Payer: Self-pay

## 2020-12-17 ENCOUNTER — Encounter (HOSPITAL_COMMUNITY): Payer: Self-pay | Admitting: Emergency Medicine

## 2020-12-17 DIAGNOSIS — J45909 Unspecified asthma, uncomplicated: Secondary | ICD-10-CM | POA: Diagnosis present

## 2020-12-17 DIAGNOSIS — E1022 Type 1 diabetes mellitus with diabetic chronic kidney disease: Secondary | ICD-10-CM | POA: Diagnosis present

## 2020-12-17 DIAGNOSIS — D631 Anemia in chronic kidney disease: Secondary | ICD-10-CM | POA: Diagnosis present

## 2020-12-17 DIAGNOSIS — F17210 Nicotine dependence, cigarettes, uncomplicated: Secondary | ICD-10-CM | POA: Diagnosis present

## 2020-12-17 DIAGNOSIS — N1832 Chronic kidney disease, stage 3b: Secondary | ICD-10-CM | POA: Diagnosis present

## 2020-12-17 DIAGNOSIS — T402X5A Adverse effect of other opioids, initial encounter: Secondary | ICD-10-CM | POA: Diagnosis present

## 2020-12-17 DIAGNOSIS — Z888 Allergy status to other drugs, medicaments and biological substances status: Secondary | ICD-10-CM

## 2020-12-17 DIAGNOSIS — E1065 Type 1 diabetes mellitus with hyperglycemia: Secondary | ICD-10-CM | POA: Diagnosis present

## 2020-12-17 DIAGNOSIS — Z794 Long term (current) use of insulin: Secondary | ICD-10-CM

## 2020-12-17 DIAGNOSIS — F32A Depression, unspecified: Secondary | ICD-10-CM | POA: Diagnosis present

## 2020-12-17 DIAGNOSIS — E860 Dehydration: Secondary | ICD-10-CM | POA: Diagnosis present

## 2020-12-17 DIAGNOSIS — E871 Hypo-osmolality and hyponatremia: Secondary | ICD-10-CM | POA: Diagnosis present

## 2020-12-17 DIAGNOSIS — Z20822 Contact with and (suspected) exposure to covid-19: Secondary | ICD-10-CM | POA: Diagnosis present

## 2020-12-17 DIAGNOSIS — J9601 Acute respiratory failure with hypoxia: Secondary | ICD-10-CM | POA: Diagnosis present

## 2020-12-17 DIAGNOSIS — F603 Borderline personality disorder: Secondary | ICD-10-CM | POA: Diagnosis present

## 2020-12-17 DIAGNOSIS — N179 Acute kidney failure, unspecified: Secondary | ICD-10-CM | POA: Diagnosis present

## 2020-12-17 DIAGNOSIS — D72829 Elevated white blood cell count, unspecified: Secondary | ICD-10-CM | POA: Diagnosis present

## 2020-12-17 DIAGNOSIS — I129 Hypertensive chronic kidney disease with stage 1 through stage 4 chronic kidney disease, or unspecified chronic kidney disease: Secondary | ICD-10-CM | POA: Diagnosis present

## 2020-12-17 DIAGNOSIS — I160 Hypertensive urgency: Secondary | ICD-10-CM | POA: Diagnosis present

## 2020-12-17 DIAGNOSIS — F419 Anxiety disorder, unspecified: Secondary | ICD-10-CM | POA: Diagnosis present

## 2020-12-17 DIAGNOSIS — R1115 Cyclical vomiting syndrome unrelated to migraine: Principal | ICD-10-CM | POA: Diagnosis present

## 2020-12-17 DIAGNOSIS — Z79899 Other long term (current) drug therapy: Secondary | ICD-10-CM

## 2020-12-17 NOTE — ED Triage Notes (Signed)
Pt was seen yesterday for the same. Pt c/o continued abd pain and vomiting. Pt states she did not get prescriptions filled.
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
## 2020-12-17 NOTE — ED Triage Notes (Signed)
[facility] from home with cc of abdominal pain that was unresolved when she was here last night.  22g l hand with NS  And ODT zofran given en route.

## 2020-12-18 ENCOUNTER — Encounter (HOSPITAL_COMMUNITY): Payer: Self-pay | Admitting: Internal Medicine

## 2020-12-18 ENCOUNTER — Emergency Department (HOSPITAL_COMMUNITY): Payer: Self-pay

## 2020-12-18 ENCOUNTER — Inpatient Hospital Stay (HOSPITAL_COMMUNITY)
Admission: EM | Admit: 2020-12-18 | Discharge: 2020-12-20 | DRG: 393 | Disposition: A | Discharge status: Home / Self Care | Payer: Self-pay | Attending: Family Medicine | Admitting: Family Medicine

## 2020-12-18 DIAGNOSIS — D72829 Elevated white blood cell count, unspecified: Secondary | ICD-10-CM

## 2020-12-18 DIAGNOSIS — F1290 Cannabis use, unspecified, uncomplicated: Secondary | ICD-10-CM

## 2020-12-18 DIAGNOSIS — R112 Nausea with vomiting, unspecified: Secondary | ICD-10-CM | POA: Diagnosis present

## 2020-12-18 DIAGNOSIS — E860 Dehydration: Secondary | ICD-10-CM

## 2020-12-18 DIAGNOSIS — E1165 Type 2 diabetes mellitus with hyperglycemia: Secondary | ICD-10-CM

## 2020-12-18 DIAGNOSIS — G8929 Other chronic pain: Secondary | ICD-10-CM | POA: Insufficient documentation

## 2020-12-18 DIAGNOSIS — R739 Hyperglycemia, unspecified: Secondary | ICD-10-CM

## 2020-12-18 DIAGNOSIS — F1199 Opioid use, unspecified with unspecified opioid-induced disorder: Secondary | ICD-10-CM | POA: Diagnosis present

## 2020-12-18 DIAGNOSIS — R1115 Cyclical vomiting syndrome unrelated to migraine: Secondary | ICD-10-CM | POA: Diagnosis present

## 2020-12-18 DIAGNOSIS — N189 Chronic kidney disease, unspecified: Secondary | ICD-10-CM

## 2020-12-18 DIAGNOSIS — J9601 Acute respiratory failure with hypoxia: Secondary | ICD-10-CM

## 2020-12-18 DIAGNOSIS — M549 Dorsalgia, unspecified: Secondary | ICD-10-CM | POA: Insufficient documentation

## 2020-12-18 DIAGNOSIS — N179 Acute kidney failure, unspecified: Secondary | ICD-10-CM

## 2020-12-18 DIAGNOSIS — I10 Essential (primary) hypertension: Secondary | ICD-10-CM | POA: Diagnosis present

## 2020-12-18 DIAGNOSIS — N912 Amenorrhea, unspecified: Secondary | ICD-10-CM | POA: Insufficient documentation

## 2020-12-18 DIAGNOSIS — N1832 Chronic kidney disease, stage 3b: Secondary | ICD-10-CM

## 2020-12-18 DIAGNOSIS — E871 Hypo-osmolality and hyponatremia: Secondary | ICD-10-CM

## 2020-12-18 LAB — COMPREHENSIVE METABOLIC PANEL
ALT: 24 U/L (ref 0–44)
AST: 27 U/L (ref 15–41)
Albumin: 3.4 g/dL — ABNORMAL LOW (ref 3.5–5.0)
Alkaline Phosphatase: 153 U/L — ABNORMAL HIGH (ref 38–126)
Anion gap: 12 (ref 5–15)
BUN: 30 mg/dL — ABNORMAL HIGH (ref 6–20)
CO2: 20 mmol/L — ABNORMAL LOW (ref 22–32)
Calcium: 8.5 mg/dL — ABNORMAL LOW (ref 8.9–10.3)
Chloride: 97 mmol/L — ABNORMAL LOW (ref 98–111)
Creatinine, Ser: 1.68 mg/dL — ABNORMAL HIGH (ref 0.44–1.00)
GFR, Estimated: 41 mL/min — ABNORMAL LOW (ref >60)
Glucose, Bld: 507 mg/dL (ref 70–99)
Potassium: 4.1 mmol/L (ref 3.5–5.1)
Sodium: 129 mmol/L — ABNORMAL LOW (ref 135–145)
Total Bilirubin: 0.7 mg/dL (ref 0.3–1.2)
Total Protein: 6.8 g/dL (ref 6.5–8.1)

## 2020-12-18 LAB — CBC
HCT: 33.9 % — ABNORMAL LOW (ref 36.0–46.0)
Hemoglobin: 11.3 g/dL — ABNORMAL LOW (ref 12.0–15.0)
MCH: 29.8 pg (ref 26.0–34.0)
MCHC: 33.3 g/dL (ref 30.0–36.0)
MCV: 89.4 fL (ref 80.0–100.0)
Platelets: 241 10*3/uL (ref 150–400)
RBC: 3.79 MIL/uL — ABNORMAL LOW (ref 3.87–5.11)
RDW: 12.1 % (ref 11.5–15.5)
WBC: 13.7 10*3/uL — ABNORMAL HIGH (ref 4.0–10.5)
nRBC: 0 % (ref 0.0–0.2)

## 2020-12-18 LAB — RESP PANEL BY RT-PCR (FLU A&B, COVID) ARPGX2
Influenza A by PCR: NEGATIVE
Influenza B by PCR: NEGATIVE
SARS Coronavirus 2 by RT PCR: NEGATIVE

## 2020-12-18 LAB — CBG MONITORING, ED
Glucose-Capillary: 428 mg/dL — ABNORMAL HIGH (ref 70–99)
Glucose-Capillary: 383 mg/dL — ABNORMAL HIGH (ref 70–99)
Glucose-Capillary: 211 mg/dL — ABNORMAL HIGH (ref 70–99)
Glucose-Capillary: 145 mg/dL — ABNORMAL HIGH (ref 70–99)

## 2020-12-18 LAB — MAGNESIUM: Magnesium: 2.1 mg/dL (ref 1.7–2.4)

## 2020-12-18 LAB — HEMOGLOBIN A1C
Hgb A1c MFr Bld: 11.5 % — ABNORMAL HIGH (ref 4.8–5.6)
Mean Plasma Glucose: 283.35 mg/dL

## 2020-12-18 LAB — POC OCCULT BLOOD, ED: Occult Blood, Stool #1: NEGATIVE

## 2020-12-18 LAB — GLUCOSE, CAPILLARY: Glucose-Capillary: 153 mg/dL — ABNORMAL HIGH (ref 70–99)

## 2020-12-18 LAB — PHOSPHORUS: Phosphorus: 3.5 mg/dL (ref 2.5–4.6)

## 2020-12-18 LAB — LIPASE, BLOOD: Lipase: 20 U/L (ref 11–51)

## 2020-12-18 LAB — HCG, SERUM, QUALITATIVE: Preg, Serum: NEGATIVE

## 2020-12-18 MED ORDER — ONDANSETRON HCL 4 MG PO TABS: 4.0000 mg | ORAL_TABLET | Freq: Four times a day (QID) | ORAL | Status: DC | PRN

## 2020-12-18 MED ORDER — HYDROMORPHONE HCL 1 MG/ML IJ SOLN
0.5000 mg | INTRAMUSCULAR | Status: DC | PRN
Start: 2020-12-18 — End: 2020-12-18

## 2020-12-18 MED ORDER — METOCLOPRAMIDE HCL 5 MG/ML IJ SOLN
5.0000 mg | Freq: Three times a day (TID) | INTRAMUSCULAR | Status: DC | PRN
  Administered 2020-12-18 – 2020-12-19 (×2): 5 mg via INTRAVENOUS
  Filled 2020-12-18 (×2): qty 2

## 2020-12-18 MED ORDER — INSULIN ASPART 100 UNIT/ML IJ SOLN
0.0000 [IU] | Freq: Three times a day (TID) | INTRAMUSCULAR | Status: DC
  Administered 2020-12-18: 20 [IU] via SUBCUTANEOUS
  Administered 2020-12-18: 2 [IU] via SUBCUTANEOUS
  Administered 2020-12-18: 5 [IU] via SUBCUTANEOUS
  Administered 2020-12-19 – 2020-12-20 (×2): 2 [IU] via SUBCUTANEOUS
  Filled 2020-12-18 (×3): qty 1

## 2020-12-18 MED ORDER — BUPRENORPHINE HCL-NALOXONE HCL 8-2 MG SL SUBL
0.5000 | SUBLINGUAL_TABLET | Freq: Three times a day (TID) | SUBLINGUAL | Status: DC
  Administered 2020-12-18 – 2020-12-20 (×8): 0.5 via SUBLINGUAL
  Filled 2020-12-18 (×8): qty 1

## 2020-12-18 MED ORDER — ENOXAPARIN SODIUM 40 MG/0.4ML IJ SOSY
40.0000 mg | PREFILLED_SYRINGE | INTRAMUSCULAR | Status: DC
  Filled 2020-12-18 (×2): qty 0.4

## 2020-12-18 MED ORDER — DIPHENHYDRAMINE HCL 50 MG/ML IJ SOLN
25.0000 mg | Freq: Once | INTRAMUSCULAR | Status: AC
  Administered 2020-12-18: 25 mg via INTRAVENOUS
  Filled 2020-12-18: qty 1

## 2020-12-18 MED ORDER — SODIUM CHLORIDE 0.9 % IV SOLN: INTRAVENOUS | Status: AC

## 2020-12-18 MED ORDER — PANTOPRAZOLE SODIUM 40 MG PO TBEC
40.0000 mg | DELAYED_RELEASE_TABLET | Freq: Every day | ORAL | Status: DC
  Administered 2020-12-19 – 2020-12-20 (×2): 40 mg via ORAL
  Filled 2020-12-18 (×3): qty 1

## 2020-12-18 MED ORDER — PROMETHAZINE HCL 25 MG/ML IJ SOLN
INTRAMUSCULAR | Status: AC
  Filled 2020-12-18: qty 1

## 2020-12-18 MED ORDER — ONDANSETRON HCL 4 MG/2ML IJ SOLN
4.0000 mg | Freq: Four times a day (QID) | INTRAMUSCULAR | Status: DC | PRN
  Administered 2020-12-18 – 2020-12-19 (×3): 4 mg via INTRAVENOUS
  Filled 2020-12-18 (×3): qty 2

## 2020-12-18 MED ORDER — ONDANSETRON HCL 4 MG/2ML IJ SOLN
4.0000 mg | Freq: Once | INTRAMUSCULAR | Status: AC
  Administered 2020-12-18: 4 mg via INTRAVENOUS
  Filled 2020-12-18: qty 2

## 2020-12-18 MED ORDER — DIPHENHYDRAMINE HCL 50 MG/ML IJ SOLN
25.0000 mg | Freq: Three times a day (TID) | INTRAMUSCULAR | Status: DC | PRN
  Administered 2020-12-18 – 2020-12-19 (×2): 25 mg via INTRAVENOUS
  Filled 2020-12-18 (×3): qty 1

## 2020-12-18 MED ORDER — INSULIN GLARGINE-YFGN 100 UNIT/ML ~~LOC~~ SOLN
16.0000 [IU] | Freq: Every day | SUBCUTANEOUS | Status: DC
  Administered 2020-12-18 – 2020-12-19 (×2): 16 [IU] via SUBCUTANEOUS
  Filled 2020-12-18 (×5): qty 0.16

## 2020-12-18 MED ORDER — SODIUM CHLORIDE 0.9 % IV SOLN
12.5000 mg | Freq: Once | INTRAVENOUS | Status: AC
  Administered 2020-12-18: 12.5 mg via INTRAVENOUS
  Filled 2020-12-18: qty 0.5

## 2020-12-18 MED ORDER — DICYCLOMINE HCL 10 MG PO CAPS
20.0000 mg | ORAL_CAPSULE | Freq: Two times a day (BID) | ORAL | Status: DC
  Administered 2020-12-18 – 2020-12-20 (×5): 20 mg via ORAL
  Filled 2020-12-18 (×5): qty 2

## 2020-12-18 MED ORDER — SUMATRIPTAN SUCCINATE 6 MG/0.5ML ~~LOC~~ SOLN
6.0000 mg | SUBCUTANEOUS | Status: DC | PRN
  Filled 2020-12-18: qty 0.5

## 2020-12-18 MED ORDER — LACTATED RINGERS IV BOLUS
1000.0000 mL | Freq: Once | INTRAVENOUS | Status: AC
  Administered 2020-12-18: 1000 mL via INTRAVENOUS

## 2020-12-18 MED ORDER — LABETALOL HCL 5 MG/ML IV SOLN
5.0000 mg | Freq: Four times a day (QID) | INTRAVENOUS | Status: DC | PRN
  Administered 2020-12-18: 5 mg via INTRAVENOUS
  Filled 2020-12-18: qty 4

## 2020-12-18 MED ORDER — OLANZAPINE 5 MG PO TABS
5.0000 mg | ORAL_TABLET | Freq: Every day | ORAL | Status: DC
  Administered 2020-12-18 – 2020-12-20 (×3): 5 mg via ORAL
  Filled 2020-12-18 (×3): qty 1

## 2020-12-18 MED ORDER — ALBUTEROL SULFATE (2.5 MG/3ML) 0.083% IN NEBU
5.0000 mg | INHALATION_SOLUTION | Freq: Once | RESPIRATORY_TRACT | Status: AC
  Administered 2020-12-18: 5 mg via RESPIRATORY_TRACT
  Filled 2020-12-18: qty 6

## 2020-12-18 MED ORDER — IPRATROPIUM BROMIDE 0.02 % IN SOLN
0.5000 mg | Freq: Once | RESPIRATORY_TRACT | Status: AC
  Administered 2020-12-18: 0.5 mg via RESPIRATORY_TRACT
  Filled 2020-12-18: qty 2.5

## 2020-12-18 MED ORDER — LACTATED RINGERS IV BOLUS
2000.0000 mL | Freq: Once | INTRAVENOUS | Status: AC
  Administered 2020-12-18: 1000 mL via INTRAVENOUS

## 2020-12-18 MED ORDER — SERTRALINE HCL 50 MG PO TABS
50.0000 mg | ORAL_TABLET | Freq: Every day | ORAL | Status: DC
  Administered 2020-12-19 – 2020-12-20 (×2): 50 mg via ORAL
  Filled 2020-12-18 (×3): qty 1

## 2020-12-18 MED ORDER — KETOROLAC TROMETHAMINE 30 MG/ML IJ SOLN
30.0000 mg | Freq: Once | INTRAMUSCULAR | Status: AC
  Administered 2020-12-18: 30 mg via INTRAVENOUS
  Filled 2020-12-18: qty 1

## 2020-12-18 MED ORDER — SODIUM CHLORIDE 0.9 % IV SOLN: Freq: Once | INTRAVENOUS | Status: AC

## 2020-12-18 NOTE — ED Notes (Signed)
Talked with md, to give 20 units of insulin and re eval in 1-2 hours.  Md notified of pt's request for pain med, MD aware of pt's nausea, suggests reglan.

## 2020-12-18 NOTE — ED Notes (Signed)
Pt has taken herself off monitor and is walking up and down the hall, returned pt to her room

## 2020-12-18 NOTE — ED Notes (Signed)
Pt vomited about after breathing tx.

## 2020-12-18 NOTE — ED Notes (Signed)
Pt returned to room, walking around in room, this nurse requests pt to be seated on bed for vital signs and IVF. Pt placed on cardiac monitor with BP to set cycle every 30 minutes. Continuous pulse oximeter applied.

## 2020-12-18 NOTE — H&P (Addendum)
History and Physical  Artelia Game UYQ:[id number] DOB: 1991/03/06 DOA: 12/18/2020  Referring physician: Zadie Rhine, MD PCP: [facility]  Patient coming from: Home  Chief Complaint: Abdominal pain  HPI: Ann Moyer is a 30 y.o. female with medical history significant for type 1 diabetes mellitus, hypertension, anxiety, depression, CKD 3B and opiate dependence who presents to the emergency department due to several days of onset of abdominal pain, nausea and vomiting.  Abdominal pain was diffuse, sharp and rated as 9/10 on pain scale.  No alleviating factor known and this was aggravated with movement.  Vomitus was nonbloody, she states that she has not been able to tolerate any liquids or foods since onset of symptoms.  She was seen in the ED 2 days ago (9/12) and she was treated with IV fluids, antiemetics, pain medications and she was discharged from the ED. She was recently admitted from 8/14-8/19 due to same symptoms (abdominal pain, cyclical vomiting).  ED Course:  In the emergency department, she was tachycardic, BP was 175/115, she was hypoxic with O2 sat ranging within 86 to 88%, work-up done in the ED showed leukocytosis, normocytic anemia, hyponatremia, BUN started Nancyi 30/1.68 and hyperglycemia.  Lipase 20, FOBT was negative.  Influenza A, B, SARS coronavirus 2 was negative. Chest x-ray showed no evidence of acute cardiopulmonary disease Breathing treatment, antiemetic and IV medication were provided.  Hospitalist was asked to admit patient for further evaluation and management.  Review of Systems: Constitutional: Negative for chills and fever.  HENT: Negative for ear pain and sore throat.   Eyes: Negative for pain and visual disturbance.  Respiratory: Positive for cough and shortness of breath.   Cardiovascular: Negative for chest pain and palpitations.  Gastrointestinal: Positive for abdominal pain, nausea and vomiting.  Endocrine: Negative for polyphagia and polyuria.   Genitourinary: Negative for decreased urine volume, dysuria, enuresis Musculoskeletal: Negative for arthralgias and back pain.  Skin: Negative for color change and rash.  Allergic/Immunologic: Negative for immunocompromised state.  Neurological: Negative for tremors, syncope, speech difficulty Hematological: Does not bruise/bleed easily.  All other systems reviewed and are negative   Past Medical History:  Diagnosis Date   Asthma    Borderline personality disorder (HCC)    CKD (chronic kidney disease)    Diabetes mellitus without complication (HCC)    Opioid use disorder    Past Surgical History:  Procedure Laterality Date   BIOPSY  07/13/2020   Procedure: BIOPSY;  Surgeon: Lanelle Bal, DO;  Location: [facility];  Service: Endoscopy;;  gastric, esophageal   BIOPSY  11/21/2020   Procedure: BIOPSY;  Surgeon: Corbin Ade, MD;  Location: [facility];  Service: Endoscopy;;   ESOPHAGOGASTRODUODENOSCOPY (EGD) WITH PROPOFOL N/A 07/13/2020   LA Grade D erosive esophagitis without bleeding, gastritis s/p biopsy, normal duodenum. Found to have candida esophagitis at that time   ESOPHAGOGASTRODUODENOSCOPY (EGD) WITH PROPOFOL N/A 11/21/2020   Procedure: ESOPHAGOGASTRODUODENOSCOPY (EGD) WITH PROPOFOL;  Surgeon: Corbin Ade, MD;  Location: [facility];  Service: Endoscopy;  Laterality: N/A;   EYE SURGERY      Social History:  reports that she has been smoking. She has been smoking an average of .5 packs per day. She has never used smokeless tobacco. She reports that she does not currently use alcohol. She reports current drug use. Drug: Marijuana.   Allergies  Allergen Reactions   Amitriptyline Swelling and Other (See Comments)    Legs and feet    Gabapentin Swelling  swelling   Pregabalin Swelling    swelling   Tramadol Itching and Swelling    Swelling, itching Eye and face swelling    Haloperidol Other (See Comments)    Uncontrollable movements  "uncontrolled  movement of my body, like tremors"; previously charted to cause nausea and vomiting.     Toradol [Ketorolac Tromethamine] Nausea Only   Ketorolac Itching   Metoclopramide Itching    Jittery and itchy Jittery and itchy     No family history on file.   Prior to Admission medications   Medication Sig Start Date End Date Taking? Authorizing Provider  acetaminophen (TYLENOL) 325 MG tablet Take 650 mg by mouth every 6 (six) hours as needed. Patient not taking: Reported on 12/16/2020 05/28/14   [provider]  albuterol (VENTOLIN HFA) 108 (90 Base) MCG/ACT inhaler Inhale 2 puffs into the lungs every 4 (four) hours as needed for wheezing or shortness of breath. 01/26/13   [provider]  amphetamine-dextroamphetamine (ADDERALL) 20 MG tablet Take 20 mg by mouth daily. 06/10/20   [provider]  buprenorphine-naloxone (SUBOXONE) 8-2 mg SUBL SL tablet Place 0.5 tablets under the tongue in the morning, at noon, in the evening, and at bedtime. 07/10/20   [provider]  busPIRone (BUSPAR) 10 MG tablet Take 1 tablet (10 mg total) by mouth 3 (three) times daily. 07/16/20   Shon Hale, MD  dicyclomine (BENTYL) 20 MG tablet Take 1 tablet (20 mg total) by mouth 2 (two) times daily. 12/16/20   Carroll Sage, PA-C  etonogestrel (NEXPLANON) 68 MG IMPL implant 68 mg by Subdermal route once. 04/03/20   [provider]  glucagon 1 MG injection Inject 1 mg into the vein once as needed (blood sugar). 08/12/10   [provider]  insulin aspart (NOVOLOG) 100 UNIT/ML FlexPen Inject 2-5 Units into the skin 2 (two) times daily before a meal. Sliding scale 04/03/20   [provider]  insulin glargine (LANTUS) 100 UNIT/ML Solostar Pen Inject 16 Units into the skin at bedtime. 02/08/20   [provider]  Melatonin 10 MG TABS Take 1 tablet by mouth daily. Patient not taking: Reported on 12/16/2020    [provider]  OLANZapine (ZYPREXA) 5  MG tablet Take 5 mg by mouth daily. 07/10/20 12/16/20  [provider]  omeprazole (PRILOSEC) 20 MG capsule Take 1 capsule (20 mg total) by mouth daily. 12/16/20 01/15/21  Carroll Sage, PA-C  ondansetron (ZOFRAN-ODT) 4 MG disintegrating tablet Take 4 mg by mouth every 8 (eight) hours as needed for nausea or vomiting. 07/10/20   [provider]  pantoprazole (PROTONIX) 40 MG tablet Take 1 tablet (40 mg total) by mouth 2 (two) times daily. 07/16/20 07/16/21  Shon Hale, MD  promethazine (PHENERGAN) 12.5 MG suppository Place 1 suppository (12.5 mg total) rectally every 6 (six) hours as needed for up to 12 doses for nausea or vomiting. 12/16/20   Carroll Sage, PA-C  sertraline (ZOLOFT) 50 MG tablet Take 1 tablet by mouth daily. 07/28/19 12/16/20  [provider]  SUMAtriptan Succinate Refill 6 MG/0.5ML SOCT Inject 6 mg into the skin as needed (migraine). 09/27/19 12/16/20  [provider]  tranexamic acid (LYSTEDA) 650 MG TABS tablet Take 1,300 mg by mouth 3 (three) times daily. Patient not taking: Reported on 12/16/2020 02/01/20   [provider]    Physical Exam: BP (!) 183/102   Pulse 99   Temp 99.5 F (37.5 C) (Rectal)   Resp 20  Ht 5\' 5"  (1.651 m)   Wt 50 kg   LMP 12/16/2020   SpO2 94%   BMI 18.34 kg/m   General: 30 y.o. year-old female well developed well nourished in no acute distress.  Alert and oriented x3. HEENT: NCAT, EOMI Neck: Supple, trachea medial Cardiovascular: Regular rate and rhythm with no rubs or gallops.  No thyromegaly or JVD noted.  No lower extremity edema. 2/4 pulses in all 4 extremities. Respiratory: Clear to auscultation with no wheezes or rales. Good inspiratory effort. Abdomen: Soft, diffuse tenderness to palpation without guarding. Normal bowel sounds x4 quadrants. Muskuloskeletal: No cyanosis, clubbing or edema noted bilaterally Neuro: CN II-XII intact, strength 5/5 x 4, sensation, reflexes intact Skin: No  ulcerative lesions noted or rashes Psychiatry: Mood is appropriate for condition and setting          Labs on Admission:  Basic Metabolic Panel: Recent Labs  Lab 12/16/20 1640 12/18/20 0013  NA 135 129*  K 4.1 4.1  CL 101 97*  CO2 25 20*  GLUCOSE 313* 507*  BUN 24* 30*  CREATININE 1.29* 1.68*  CALCIUM 9.0 8.5*   Liver Function Tests: Recent Labs  Lab 12/16/20 1640 12/18/20 0013  AST 28 27  ALT 25 24  ALKPHOS 186* 153*  BILITOT 0.6 0.7  PROT 7.9 6.8  ALBUMIN 3.7 3.4*   Recent Labs  Lab 12/16/20 1640 12/18/20 0013  LIPASE 22 20   No results for input(s): AMMONIA in the last 168 hours. CBC: Recent Labs  Lab 12/16/20 1640 12/18/20 0013  WBC 9.8 13.7*  NEUTROABS 8.9*  --   HGB 12.7 11.3*  HCT 37.6 33.9*  MCV 88.9 89.4  PLT 231 241   Cardiac Enzymes: No results for input(s): CKTOTAL, CKMB, CKMBINDEX, TROPONINI in the last 168 hours.  BNP (last 3 results) No results for input(s): BNP in the last 8760 hours.  ProBNP (last 3 results) No results for input(s): PROBNP in the last 8760 hours.  CBG: Recent Labs  Lab 12/16/20 1707  GLUCAP 307*    Radiological Exams on Admission: DG Chest Port 1 View  Result Date: 12/18/2020 CLINICAL DATA:  Cough EXAM: PORTABLE CHEST 1 VIEW COMPARISON:  None. FINDINGS: Lungs are clear.  No pleural effusion or pneumothorax. The heart is normal in size. IMPRESSION: No evidence of acute cardiopulmonary disease. Electronically Signed   By: 12/20/2020 M.D.   On: 12/18/2020 02:29    EKG: I independently viewed the EKG done and my findings are as followed: Sinus tachycardia at a rate of 100 bpm  Assessment/Plan Present on Admission:  Intractable nausea and vomiting  Hypertension  Cyclic vomiting syndrome  Opioid use with opioid-induced disorder (HCC)  Principal Problem:   Intractable nausea and vomiting Active Problems:   Hypertension   Opioid use with opioid-induced disorder (HCC)   Cyclic vomiting syndrome    Acute respiratory failure with hypoxia (HCC)   Hyperglycemia due to diabetes mellitus (HCC)   Leukocytosis   Hyponatremia   Marijuana use   Intractable nausea, vomiting (cyclical) and abdominal pain Patient complained of recurrent abdominal pain, nausea and vomiting that has been ongoing for several days and she was seen in the ED 2 days ago (9/12) and was admitted to this hospital from 8/14-8/19 due to similar symptoms Gastroparesis was ruled out due to recent normal gastroesophageal study at [facility] per medical record Continue IV NS at 75 mLs/Hr Continue Suboxone IV Toradol and IV Benadryl will be given Continue IV Zofran/Phenergan p.r.n. Continue clear  liquid diet with plan to advance diet as tolerated Consider CT abdomen with contrast if abdominal pain continues to worsen and creatinine has improved Consider GI consult for worsening of symptoms  Acute respiratory failure with hypoxia She was noted to be hypoxic with O2 sat of 86-88% Continue supplemental oxygen at 2 LPM via [place] with plan to wean patient off supplemental oxygen as tolerated  Leukocytosis possibly reactive WBC 13.7, continue to monitor WBC with morning labs  Acute kidney injury on chronic kidney disease stage 3B Creatinine up to 1.68, this was 1.29 on 9/12  Continue IV hydration Renally adjust medications, avoid nephrotoxic agents/dehydration/hypotension  Hyponatremia possibly due to dehydration Patient appears to be evaluated and this is possibly due to recurrent vomiting and decreased oral intake Continue IV hydration Continue to monitor sodium levels with morning labs  Marijuana use Last THC was few days ago Patient counseled on marijuana cessation  Hyperglycemia secondary to T1DM Continue ISS and hypoglycemic protocol  Opioid use with opioid-induced disorder Continue home Suboxone  Hypertensive Urgency/ Hypertension BP was elevated in the setting of abdominal pain and possibly missed doses of an  hypertensive due to nausea and vomiting Continue IV labetalol 5 mg every 6 hours as needed for SBP > 180 and transition to p.o. once patient is able to tolerate oral intake  Depression/anxiety  Continue home meds when patient is able to tolerate oral intake   DVT prophylaxis: Lovenox  Code Status: Full code  Family Communication: None at bedside  Disposition Plan:  Patient is from:                        home Anticipated DC to:                   SNF or family members home Anticipated DC date:               2-3 days Anticipated DC barriers:         Patient requires inpatient management due to recurrent abdominal pain and cyclical vomiting  Consults called: None  Admission status: Inpatient    Frankey Shown MD [facility] Pager [phone]  If 7PM-7AM, please contact night-coverage [email or web address] Password [facility]  12/18/2020, 5:04 AM

## 2020-12-18 NOTE — ED Provider Notes (Signed)
[facility] EMERGENCY DEPARTMENT Provider Note   CSN: [id number] Arrival date & time: 12/17/20  2319     History Chief Complaint  Patient presents with   Abdominal Pain    Leatha Rohner is a 30 y.o. female.  The history is provided by the patient.  Abdominal Pain Pain location:  Generalized Pain radiates to:  Does not radiate Pain severity:  Severe Onset quality:  Gradual Timing:  Constant Progression:  Worsening Chronicity:  Chronic Relieved by:  Nothing Worsened by:  Movement and palpation Associated symptoms: cough, melena, nausea, shortness of breath, vaginal bleeding and vomiting   Associated symptoms: no chest pain, no dysuria and no fever   Patient with history of asthma, chronic kidney disease, diabetes, opioid use disorder presents with repeat ER visit. She reports recurrent diffuse abdominal pain, vomiting and black/bloody stool.  She also reports increasing cough and shortness of breath.       Past Medical History:  Diagnosis Date   Asthma    Borderline personality disorder (HCC)    CKD (chronic kidney disease)    Diabetes mellitus without complication (HCC)    Opioid use disorder     Patient Active Problem List   Diagnosis Date Noted   Epigastric pain    Non-intractable vomiting    Intractable abdominal pain 11/18/2020   Intractable nausea and vomiting 11/18/2020   Cyclic vomiting syndrome 11/18/2020   Uncomplicated opioid dependence (HCC)    Depression with anxiety    Intractable vomiting    Esophagitis  07/14/2020   AKI (acute kidney injury) (HCC) 07/13/2020   Borderline personality disorder (HCC) 07/12/2020   Type 1 diabetes mellitus not at goal [facility]) 07/12/2020   Hypertension 09/12/2018   CKD (chronic kidney disease), 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   Opioid use with opioid-induced disorder (HCC) 08/24/2013   Diabetic neuropathy (HCC) 06/02/2012   Diabetic gastroparesis (HCC) 09/10/2011   Mild intermittent asthma, uncomplicated 04/22/2011    Past  Surgical History:  Procedure Laterality Date   BIOPSY  07/13/2020   Procedure: BIOPSY;  Surgeon: Lanelle Bal, DO;  Location: [facility];  Service: Endoscopy;;  gastric, esophageal   BIOPSY  11/21/2020   Procedure: BIOPSY;  Surgeon: Corbin Ade, MD;  Location: [facility];  Service: Endoscopy;;   ESOPHAGOGASTRODUODENOSCOPY (EGD) WITH PROPOFOL N/A 07/13/2020   LA Grade D erosive esophagitis without bleeding, gastritis s/p biopsy, normal duodenum. Found to have candida esophagitis at that time   ESOPHAGOGASTRODUODENOSCOPY (EGD) WITH PROPOFOL N/A 11/21/2020   Procedure: ESOPHAGOGASTRODUODENOSCOPY (EGD) WITH PROPOFOL;  Surgeon: Corbin Ade, MD;  Location: [facility];  Service: Endoscopy;  Laterality: N/A;   EYE SURGERY       OB History   No obstetric history on file.     No family history on file.  Social History   Tobacco Use   Smoking status: Every Day    Packs/day: 0.50    Types: Cigarettes   Smokeless tobacco: Never  Substance Use Topics   Alcohol use: Not Currently   Drug use: Yes    Types: Marijuana    Comment: daily    Home Medications Prior to Admission medications   Medication Sig Start Date End Date Taking? Authorizing Provider  acetaminophen (TYLENOL) 325 MG tablet Take 650 mg by mouth every 6 (six) hours as needed. Patient not taking: Reported on 12/16/2020 05/28/14   [provider]  albuterol (VENTOLIN HFA) 108 (90 Base) MCG/ACT inhaler Inhale 2 puffs into the lungs every 4 (four) hours  as needed for wheezing or shortness of breath. 01/26/13   [provider]  amphetamine-dextroamphetamine (ADDERALL) 20 MG tablet Take 20 mg by mouth daily. 06/10/20   [provider]  buprenorphine-naloxone (SUBOXONE) 8-2 mg SUBL SL tablet Place 0.5 tablets under the tongue in the morning, at noon, in the evening, and at bedtime. 07/10/20   [provider]  busPIRone (BUSPAR) 10 MG tablet Take 1 tablet (10 mg total) by mouth 3 (three)  times daily. 07/16/20   Shon Hale, MD  dicyclomine (BENTYL) 20 MG tablet Take 1 tablet (20 mg total) by mouth 2 (two) times daily. 12/16/20   Carroll Sage, PA-C  etonogestrel (NEXPLANON) 68 MG IMPL implant 68 mg by Subdermal route once. 04/03/20   [provider]  glucagon 1 MG injection Inject 1 mg into the vein once as needed (blood sugar). 08/12/10   [provider]  insulin aspart (NOVOLOG) 100 UNIT/ML FlexPen Inject 2-5 Units into the skin 2 (two) times daily before a meal. Sliding scale 04/03/20   [provider]  insulin glargine (LANTUS) 100 UNIT/ML Solostar Pen Inject 16 Units into the skin at bedtime. 02/08/20   [provider]  Melatonin 10 MG TABS Take 1 tablet by mouth daily. Patient not taking: Reported on 12/16/2020    [provider]  OLANZapine (ZYPREXA) 5 MG tablet Take 5 mg by mouth daily. 07/10/20 12/16/20  [provider]  omeprazole (PRILOSEC) 20 MG capsule Take 1 capsule (20 mg total) by mouth daily. 12/16/20 01/15/21  Carroll Sage, PA-C  ondansetron (ZOFRAN-ODT) 4 MG disintegrating tablet Take 4 mg by mouth every 8 (eight) hours as needed for nausea or vomiting. 07/10/20   [provider]  pantoprazole (PROTONIX) 40 MG tablet Take 1 tablet (40 mg total) by mouth 2 (two) times daily. 07/16/20 07/16/21  Shon Hale, MD  promethazine (PHENERGAN) 12.5 MG suppository Place 1 suppository (12.5 mg total) rectally every 6 (six) hours as needed for up to 12 doses for nausea or vomiting. 12/16/20   Carroll Sage, PA-C  sertraline (ZOLOFT) 50 MG tablet Take 1 tablet by mouth daily. 07/28/19 12/16/20  [provider]  SUMAtriptan Succinate Refill 6 MG/0.5ML SOCT Inject 6 mg into the skin as needed (migraine). 09/27/19 12/16/20  [provider]  tranexamic acid (LYSTEDA) 650 MG TABS tablet Take 1,300 mg by mouth 3 (three) times daily. Patient not taking: Reported on 12/16/2020 02/01/20   [provider]    Allergies    Amitriptyline, Gabapentin, Pregabalin, Tramadol, Haloperidol, Toradol [ketorolac tromethamine], Ketorolac, and Metoclopramide  Review of Systems   Review of Systems  Constitutional:  Negative for fever.  Respiratory:  Positive for cough and shortness of breath.   Cardiovascular:  Negative for chest pain.  Gastrointestinal:  Positive for abdominal pain, blood in stool, melena, nausea and vomiting.  Genitourinary:  Positive for vaginal bleeding. Negative for dysuria.  All other systems reviewed and are negative.  Physical Exam Updated Vital Signs BP (!) 170/97   Pulse (!) 102   Temp 99.5 F (37.5 C) (Rectal)   Resp (!) 24   Ht 1.651 m (5\' 5" )   Wt 50 kg   LMP 12/16/2020   SpO2 91%   BMI 18.34 kg/m   Physical Exam CONSTITUTIONAL: Chronically ill-appearing, anxious HEAD: Normocephalic/atraumatic EYES: EOMI/PERRL ENMT: Mucous membranes dry NECK: supple no meningeal signs SPINE/BACK:entire spine nontender CV: S1/S2 noted, no murmurs/rubs/gallops noted LUNGS: Coarse wheezing noted bilaterally, hypoxia noted ABDOMEN: soft, diffuse mild  tenderness, no rebound or guarding, bowel sounds noted throughout abdomen GU:no cva tenderness NEURO: Pt is awake/alert/appropriate, moves all extremitiesx4.  No facial droop.   EXTREMITIES: pulses normal/equal, full ROM SKIN: warm, color normal PSYCH: Anxious  ED Results / Procedures / Treatments   Labs (all labs ordered are listed, but only abnormal results are displayed) Labs Reviewed  COMPREHENSIVE METABOLIC PANEL - Abnormal; Notable for the following components:      Result Value   Sodium 129 (*)    Chloride 97 (*)    CO2 20 (*)    Glucose, Bld 507 (*)    BUN 30 (*)    Creatinine, Ser 1.68 (*)    Calcium 8.5 (*)    Albumin 3.4 (*)    Alkaline Phosphatase 153 (*)    GFR, Estimated 41 (*)    All other components within normal limits  CBC - Abnormal; Notable for the following components:   WBC 13.7  (*)    RBC 3.79 (*)    Hemoglobin 11.3 (*)    HCT 33.9 (*)    All other components within normal limits  POC OCCULT BLOOD, ED - Normal  RESP PANEL BY RT-PCR (FLU A&B, COVID) ARPGX2  LIPASE, BLOOD  HCG, SERUM, QUALITATIVE  URINALYSIS, ROUTINE W REFLEX MICROSCOPIC    EKG EKG Interpretation  Date/Time:  Wednesday December 18 2020 02:30:07 EDT Ventricular Rate:  100 PR Interval:  91 QRS Duration: 73 QT Interval:  359 QTC Calculation: 463 R Axis:   63 Text Interpretation: Sinus tachycardia Borderline T abnormalities, anterior leads Baseline wander in lead(s) V3 No previous ECGs available Confirmed by Zadie Rhine ([id number]) on 12/18/2020 2:37:15 AM  Radiology DG Chest Port 1 View  Result Date: 12/18/2020 CLINICAL DATA:  Cough EXAM: PORTABLE CHEST 1 VIEW COMPARISON:  None. FINDINGS: Lungs are clear.  No pleural effusion or pneumothorax. The heart is normal in size. IMPRESSION: No evidence of acute cardiopulmonary disease. Electronically Signed   By: Charline Bills M.D.   On: 12/18/2020 02:29    Procedures .Critical Care Performed by: Zadie Rhine, MD Authorized by: Zadie Rhine, MD   Critical care provider statement:    Critical care time (minutes):  45   Critical care start time:  12/18/2020 3:45 AM   Critical care end time:  12/18/2020 4:30 AM   Critical care time was exclusive of:  Separately billable procedures and treating other patients   Critical care was necessary to treat or prevent imminent or life-threatening deterioration of the following conditions:  Respiratory failure, dehydration and endocrine crisis   Critical care was time spent personally by me on the following activities:  Discussions with consultants, evaluation of patient's response to treatment, examination of patient, ordering and performing treatments and interventions, ordering and review of laboratory studies, ordering and review of radiographic studies, pulse oximetry, re-evaluation of patient's  condition, obtaining history from patient or surrogate, review of old charts and development of treatment plan with patient or surrogate   I assumed direction of critical care for this patient from another provider in my specialty: yes     Care discussed with: admitting provider     Medications Ordered in ED Medications  promethazine (PHENERGAN) 12.5 mg in sodium chloride 0.9 % 50 mL IVPB (has no administration in time range)  lactated ringers bolus 2,000 mL (0 mLs Intravenous Stopped 12/18/20 0349)  albuterol (PROVENTIL) (2.5 MG/3ML) 0.083% nebulizer solution 5 mg (5 mg Nebulization Given 12/18/20 0333)  ipratropium (ATROVENT) nebulizer solution 0.5 mg (0.5  mg Nebulization Given 12/18/20 0333)  ondansetron (ZOFRAN) injection 4 mg (4 mg Intravenous Given 12/18/20 0242)  promethazine (PHENERGAN) 12.5 mg in sodium chloride 0.9 % 50 mL IVPB (0 mg Intravenous Stopped 12/18/20 0258)  lactated ringers bolus 1,000 mL (1,000 mLs Intravenous New Bag/Given 12/18/20 0348)    ED Course  I have reviewed the triage vital signs and the nursing notes.  Pertinent labs & imaging results that were available during my care of the patient were reviewed by me and considered in my medical decision making (see chart for details).    MDM Rules/Calculators/A&P                           Patient presents with 2 issues.  She reports recurrent abdominal pain with vomiting and black bloody stool.  Fortunately no signs of acute GI bleed at this time on exam as nurse performed rectal temperature and there was no black or bloody stool with negative Hemoccult She was just seen in the Emergency Department for abdominal pain.  She has had extensive evaluation previously with CT imaging as well as gastroenterology evaluations.  Her symptoms were felt to be due to multifactorial including esophagitis, gastritis and cyclic vomiting.  Cannabis hyperemesis may also be playing a role Patient is dehydrated as well as hyperglycemia, will  give IV fluids.  There is no anion gap to suggest DKA Will defer CT imaging at this time.  I advised patient that narcotics are not recommended at this point for recurrent abdominal pain.  Patient also noted to be tachypneic, hypoxic with coarse wheezing bilaterally.  Her chest x-ray is negative. COVID test is pending. Nebulized therapy has been ordered 4:42 AM Patient received nebulized therapy but then vomited.  She reports persistent pain.  Abdominal exam reveals soft abdomen without distention and no rebound or guarding.  Will defer further work-up as this is likely an acute on chronic issue.  Patient is still requiring supplemental oxygen.  She will be admitted for acute respiratory failure with hypoxia likely due to status asthmaticus due to wheezing.  She will also require IV fluids and management of her nausea.  Due to multiple drug allergies, promethazine was given the patient  Discussed with Dr. Thomes Dinning for admission Final Clinical Impression(s) / ED Diagnoses Final diagnoses:  Hyperglycemia  Dehydration  Acute respiratory failure with hypoxia (HCC)  Intractable nausea and vomiting    Rx / DC Orders ED Discharge Orders     None        Zadie Rhine, MD 12/18/20 [phone]

## 2020-12-18 NOTE — ED Notes (Signed)
Pt walking up and down the hall, returned pt to room, pt states that she doesn't want anything by mouth, states that she is nauseated, states that she has not vomited since earlier in the morning, pt has dropped her emesis bag on the floor, cleaned up area.  Pt has ordered reglan, pt states that this makes her itch and will only take it with benadryl, both given.

## 2020-12-18 NOTE — ED Notes (Addendum)
Pt standing at bedside yelling out due to abdominal pain, this nurse requested pt to sit on bed for vital signs and IVF. After vs and ivf connected pt report she needs to go the bathroom due to urge to defecate. Pt reports diarrhea x 3 days. O2 sats 88-92% on room air. Reported to Dr. Bebe Shaggy. Pt disconnected for bathroom. Gait steady.

## 2020-12-18 NOTE — Progress Notes (Signed)
PROGRESS NOTE    Patient: Ann Moyer                            [facility]                    DOB: 1991-01-17            DOA: 12/18/2020 [id number]             DOS: 12/18/2020, 1:43 PM   LOS: 0 days   Date of Service: The patient was seen and examined on 12/18/2020  Subjective:   The patient was seen and examined this morning. Stable at this time. Still complaining of: Abdominal pain, nausea vomiting Otherwise no issues overnight .  Brief Narrative:   Ann Moyer is a 30 y.o. female with medical history significant for type 1 diabetes mellitus, hypertension, anxiety, depression, CKD 3B and opiate dependence who presents to the emergency department due to several days of onset of abdominal pain, nausea and vomiting.  Abdominal pain was diffuse, sharp and rated as 9/10 on pain scale.  No alleviating factor known and this was aggravated with movement.  Vomitus was nonbloody, she states that she has not been able to tolerate any liquids or foods since onset of symptoms.  She was seen in the [facility] 2 days ago (9/12) and she was treated with IV fluids, antiemetics, pain medications and she was discharged from the [facility]. She was recently admitted from 8/14-8/19 due to same symptoms (abdominal pain, cyclical vomiting).   [facility] Course:  In the emergency department, she was tachycardic, BP was 175/115, she was hypoxic with O2 sat ranging within 86 to 88%, work-up done in the [facility] showed leukocytosis, normocytic anemia, hyponatremia, BUN started [place] 30/1.68 and hyperglycemia.  Lipase 20, FOBT was negative.  Influenza A, B, SARS coronavirus 2 was negative. Chest x-ray showed no evidence of acute cardiopulmonary disease Breathing treatment, antiemetic and IV medication were provided.  Hospitalist was asked to admit patient for further evaluation and management.   Assessment & Plan:   Principal Problem:   Intractable nausea and vomiting Active Problems:   Chronic kidney disease    Hypertension   Opioid use with opioid-induced disorder (HCC)   Acute kidney injury superimposed on CKD (HCC)   Cyclic vomiting syndrome   Acute respiratory failure with hypoxia (HCC)   Hyperglycemia due to diabetes mellitus (HCC)   Leukocytosis   Hyponatremia   Marijuana use   Intractable nausea, vomiting (cyclical) and abdominal pain -Still complaining of nausea vomiting, abdominal pain  Patient complained of recurrent abdominal pain, nausea and vomiting that has been ongoing for several days and she was seen in the [facility] 2 days ago (9/12) and was admitted to this hospital from 8/14-8/19 due to similar symptoms Gastroparesis was ruled out due to recent normal gastroesophageal study at [facility] per medical record -We will continue IV NS at 75 mLs/Hr -If tolerating p.o., we will continue Suboxone - IV Toradol and IV Benadryl will be given Continue IV Zofran/Phenergan p.r.n. -Adding scheduled IV Reglan for now Continue clear liquid diet with plan to advance diet as tolerated  -We will consider CT of abdomen if no improvement Will consider GI consult  Consider GI consult for worsening of symptoms   Acute respiratory failure with hypoxia She was noted to be hypoxic with O2 sat of 86-88% Continue supplemental oxygen at 2 LPM via [place] with plan to wean patient off  supplemental oxygen as tolerated... Currently 96% on room air  Leukocytosis possibly reactive WBC 13.7, continue to monitor WBC with morning labs Currently no signs of infection, abstaining from antibiotics   Acute kidney injury on chronic kidney disease stage 3B Creatinine up to 1.68, this was 1.29 on 9/12  We will continue IV fluid hydration, monitoring closely Renally adjust medications, avoid nephrotoxic agents/dehydration/hypotension   Hyponatremia possibly due to dehydration Appears dehydrated, Continue IV hydration Continue to monitor sodium levels with morning labs   Marijuana use Last THC was few days ago Patient  counseled on marijuana cessation  Hyperglycemia secondary to T1DM Continue ISS and hypoglycemic protocol  Opioid use with opioid-induced disorder Continue home Suboxone  Hypertensive Urgency/ Hypertension BP was elevated in the setting of abdominal pain and possibly missed doses of an hypertensive due to nausea and vomiting -Restarting oral p.o. medications -Continue with as needed IV labetalol, IV hydralazine   Depression/anxiety  Continue home meds when patient is able to tolerate oral intake     DVT prophylaxis: Lovenox   Code Status: Full code   Family Communication: None at bedside   Disposition Plan:  Patient is from:                        home Anticipated DC to:                   home Anticipated DC date:               2-3 days Anticipated DC barriers:         Patient requires inpatient management due to recurrent abdominal pain and cyclical vomiting   Consults called: None   Admission status: Inpatient   ---------------------------------------------------------------------------------------------------------------------------- Cultures; None   Antimicrobials: None   Level of care: Med-Surg   Procedures:   No admission procedures for hospital encounter.    Antimicrobials:  Anti-infectives (From admission, onward)    None        Medication:   buprenorphine-naloxone  0.5 tablet Sublingual Q8H   dicyclomine  20 mg Oral BID   enoxaparin (LOVENOX) injection  40 mg Subcutaneous Q24H   insulin aspart  0-15 Units Subcutaneous TID WC   insulin glargine-yfgn  16 Units Subcutaneous QHS   OLANZapine  5 mg Oral Daily   pantoprazole  40 mg Oral Daily   sertraline  50 mg Oral Daily    diphenhydrAMINE, labetalol, metoCLOPramide (REGLAN) injection, ondansetron **OR** ondansetron (ZOFRAN) IV, SUMAtriptan   Objective:   Vitals:   12/18/20 0845 12/18/20 0900 12/18/20 0930 12/18/20 1301  BP:   (!) 171/98 (!) 154/99  Pulse:   92 100  Resp:   11 14   Temp:   98.2 F (36.8 C)   TempSrc:   Oral   SpO2: 98% 96% 98% 96%  Weight:      Height:        Intake/Output Summary (Last 24 hours) at 12/18/2020 1343 Last data filed at 12/18/2020 0933 Gross per 24 hour  Intake 236.1 ml  Output --  Net 236.1 ml   Filed Weights   12/17/20 2329  Weight: 50 kg     Examination:   Physical Exam  Constitution:  Alert, cooperative, no distress,  Appears calm and comfortable  Psychiatric: Normal and stable mood and affect, cognition intact,   HEENT: Normocephalic, PERRL, otherwise with in Normal limits  Chest:Chest symmetric Cardio vascular:  S1/S2, RRR, No murmure, No Rubs or Gallops  pulmonary: Clear  to auscultation bilaterally, respirations unlabored, negative wheezes / crackles Abdomen: Diffuse abdominal tenderness, soft, non-distended, bowel sounds,no masses, no organomegaly Muscular skeletal: Limited exam - in bed, able to move all 4 extremities, Normal strength,  Neuro: CNII-XII intact. , normal motor and sensation, reflexes intact  Extremities: No pitting edema lower extremities, +2 pulses  Skin: Dry, warm to touch, negative for any Rashes, No open wounds Wounds: per nursing documentation    ------------------------------------------------------------------------------------------------------------------------------------------    LABs:  CBC Latest Ref Rng & Units 12/18/2020 12/16/2020 11/20/2020  WBC 4.0 - 10.5 K/uL 13.7(H) 9.8 10.5  Hemoglobin 12.0 - 15.0 g/dL 11.3(L) 12.7 11.3(L)  Hematocrit 36.0 - 46.0 % 33.9(L) 37.6 33.6(L)  Platelets 150 - 400 K/uL 241 231 222   CMP Latest Ref Rng & Units 12/18/2020 12/16/2020 11/22/2020  Glucose 70 - 99 mg/dL 500(XF) 818(E) 84  BUN 6 - 20 mg/dL 99(B) 71(I) 96(V)  Creatinine 0.44 - 1.00 mg/dL 8.93(Y) 1.01(B) 5.10(C)  Sodium 135 - 145 mmol/L 129(L) 135 138  Potassium 3.5 - 5.1 mmol/L 4.1 4.1 4.1  Chloride 98 - 111 mmol/L 97(L) 101 109  CO2 22 - 32 mmol/L 20(L) 25 23  Calcium 8.9 - 10.3 mg/dL  5.8(N) 9.0 2.7(P)  Total Protein 6.5 - 8.1 g/dL 6.8 7.9 -  Total Bilirubin 0.3 - 1.2 mg/dL 0.7 0.6 -  Alkaline Phos 38 - 126 U/L 153(H) 186(H) -  AST 15 - 41 U/L 27 28 -  ALT 0 - 44 U/L 24 25 -       Micro Results Recent Results (from the past 240 hour(s))  Resp Panel by RT-PCR (Flu A&B, Covid) Nasopharyngeal Swab     Status: None   Collection Time: 12/18/20  2:03 AM   Specimen: Nasopharyngeal Swab; Nasopharyngeal(NP) swabs in vial transport medium  Result Value Ref Range Status   SARS Coronavirus 2 by RT PCR NEGATIVE NEGATIVE Final    Comment: (NOTE) SARS-CoV-2 target nucleic acids are NOT DETECTED.  The SARS-CoV-2 RNA is generally detectable in upper respiratory specimens during the acute phase of infection. The lowest concentration of SARS-CoV-2 viral copies this assay can detect is 138 copies/mL. A negative result does not preclude SARS-Cov-2 infection and should not be used as the sole basis for treatment or other patient management decisions. A negative result may occur with  improper specimen collection/handling, submission of specimen other than nasopharyngeal swab, presence of viral mutation(s) within the areas targeted by this assay, and inadequate number of viral copies(<138 copies/mL). A negative result must be combined with clinical observations, patient history, and epidemiological information. The expected result is Negative.  Fact Sheet for Patients:  BloggerCourse.com  Fact Sheet for Healthcare Providers:  SeriousBroker.it  This test is no t yet approved or cleared by the [place] FDA and  has been authorized for detection and/or diagnosis of SARS-CoV-2 by FDA under an Emergency Use Authorization (EUA). This EUA will remain  in effect (meaning this test can be used) for the duration of the COVID-19 declaration under Section 564(b)(1) of the Act, 21 U.S.C.section 360bbb-3(b)(1), unless the authorization  is terminated  or revoked sooner.       Influenza A by PCR NEGATIVE NEGATIVE Final   Influenza B by PCR NEGATIVE NEGATIVE Final    Comment: (NOTE) The Xpert Xpress SARS-CoV-2/FLU/RSV plus assay is intended as an aid in the diagnosis of influenza from Nasopharyngeal swab specimens and should not be used as a sole basis for treatment. Nasal washings and aspirates are unacceptable for Xpert  Xpress SARS-CoV-2/FLU/RSV testing.  Fact Sheet for Patients: BloggerCourse.com  Fact Sheet for Healthcare Providers: SeriousBroker.it  This test is not yet approved or cleared by the [place] FDA and has been authorized for detection and/or diagnosis of SARS-CoV-2 by FDA under an Emergency Use Authorization (EUA). This EUA will remain in effect (meaning this test can be used) for the duration of the COVID-19 declaration under Section 564(b)(1) of the Act, 21 U.S.C. section 360bbb-3(b)(1), unless the authorization is terminated or revoked.  Performed at [facility], [address] [place], [place] [address]     Radiology Reports US RENAL  Result Date: 11/20/2020 CLINICAL DATA:  Acute kidney injury EXAM: RENAL / URINARY TRACT ULTRASOUND COMPLETE COMPARISON:  No prior ultrasound, correlation is made to CT abdomen pelvis 11/18/2020 FINDINGS: Right Kidney: Renal measurements: 11.8 x 4.0 x 5 3 cm = volume: 132 mL. Echogenicity within normal limits. No mass or hydronephrosis visualized. Left Kidney: Renal measurements: 11.6 x 4.5 x 4.8 cm = volume: 128 mL. Echogenicity within normal limits. No mass or hydronephrosis visualized. Bladder: The bladder is not well distended, limiting evaluation, although the wall does appear somewhat irregular, and measures to 0.8 cm. Other: None. IMPRESSION: 1. Normal kidneys bilaterally.  No hydronephrosis or mass. 2. Mild bladder wall irregularity, which may be due to underdistension of the bladder. Electronically  Signed   By: Wiliam Ke M.D.   On: 11/20/2020 14:30   DG Chest Port 1 View  Result Date: 12/18/2020 CLINICAL DATA:  Cough EXAM: PORTABLE CHEST 1 VIEW COMPARISON:  None. FINDINGS: Lungs are clear.  No pleural effusion or pneumothorax. The heart is normal in size. IMPRESSION: No evidence of acute cardiopulmonary disease. Electronically Signed   By: Charline Bills M.D.   On: 12/18/2020 02:29    SIGNED: Kendell Bane, MD, FHM. [facility],  Pager (please use [email or web address] to page/text) Please use Epic Secure Chat for non-urgent communication (7AM-7PM)  If 7PM-7AM, please contact night-coverage [email or web address], 12/18/2020, 1:43 PM

## 2020-12-18 NOTE — ED Notes (Signed)
Rectal temp obtained per request of Dr. Bebe Shaggy, Temp 99.5. Stool from temp probe used for poc hemoccult for blood, test results negative for blood.

## 2020-12-18 NOTE — Progress Notes (Signed)
Inpatient Diabetes Program Recommendations  AACE/ADA: New Consensus Statement on Inpatient Glycemic Control (2015)  Target Ranges:  Prepandial:   less than 140 mg/dL      Peak postprandial:   less than 180 mg/dL (1-2 hours)      Critically ill patients:  140 - 180 mg/dL   Lab Results  Component Value Date   GLUCAP 428 (H) 12/18/2020   HGBA1C 12.8 (H) 07/12/2020    Review of Glycemic Control Results for IMAYA, DUFFY (MRN [id number]) as of 12/18/2020 10:05  Ref. Range 12/16/2020 17:07 12/18/2020 07:01  Glucose-Capillary Latest Ref Range: 70 - 99 mg/dL 185 (H) 631 (H)  Results for TAYGAN, CONNELL (MRN [id number]) as of 12/18/2020 10:05  Ref. Range 12/18/2020 00:13  CO2 Latest Ref Range: 22 - 32 mmol/L 20 (L)  Glucose Latest Ref Range: 70 - 99 mg/dL 588 (HH)  BUN Latest Ref Range: 6 - 20 mg/dL 30 (H)  Creatinine Latest Ref Range: 0.44 - 1.00 mg/dL 5.02 (H)  Calcium Latest Ref Range: 8.9 - 10.3 mg/dL 8.5 (L)  Anion gap Latest Ref Range: 5 - 15  12   Diabetes history: DM1 (makes NO insulin; requires basal, correction, and carb coverage insulin) Outpatient Diabetes medications: Lantus 16 units QHS, Novolog 2-5 units BID Current orders for Inpatient glycemic control: Semglee 16 units QHS, Novolog 0-15 units tid  Inpatient Diabetes Program Recommendations:   -Decrease Semglee to 10 units qd and give as soon as possible. -Decrease Novolog correction to 0-6 units q 4 hrs. While nauseated then, tid when eating  -Add Novolog 2 units tid meal coverage if eats 50% when patient starts eating  Anticipate hypoglycemia today after receiving Novolog 20 units @ 9:36 am. Sent secure chat to RN Ocie Bob and attending Dr. Flossie Dibble.  Thank you, Billy Fischer. Ola Fawver, RN, MSN, CDE  Diabetes Coordinator Inpatient Glycemic Control Team Team Pager [phone] (8am-5pm) 12/18/2020 10:21 AM

## 2020-12-18 NOTE — ED Notes (Signed)
Texted md about pt's blood sugar and needed insulin.

## 2020-12-19 LAB — COMPREHENSIVE METABOLIC PANEL
ALT: 17 U/L (ref 0–44)
AST: 21 U/L (ref 15–41)
Albumin: 2.6 g/dL — ABNORMAL LOW (ref 3.5–5.0)
Alkaline Phosphatase: 114 U/L (ref 38–126)
Anion gap: 6 (ref 5–15)
BUN: 29 mg/dL — ABNORMAL HIGH (ref 6–20)
CO2: 26 mmol/L (ref 22–32)
Calcium: 8.4 mg/dL — ABNORMAL LOW (ref 8.9–10.3)
Chloride: 107 mmol/L (ref 98–111)
Creatinine, Ser: 1.49 mg/dL — ABNORMAL HIGH (ref 0.44–1.00)
GFR, Estimated: 48 mL/min — ABNORMAL LOW (ref >60)
Glucose, Bld: 111 mg/dL — ABNORMAL HIGH (ref 70–99)
Potassium: 3.8 mmol/L (ref 3.5–5.1)
Sodium: 139 mmol/L (ref 135–145)
Total Bilirubin: 0.3 mg/dL (ref 0.3–1.2)
Total Protein: 5.5 g/dL — ABNORMAL LOW (ref 6.5–8.1)

## 2020-12-19 LAB — PROTIME-INR
INR: 0.9 (ref 0.8–1.2)
Prothrombin Time: 12.5 seconds (ref 11.4–15.2)

## 2020-12-19 LAB — CBC
HCT: 31.3 % — ABNORMAL LOW (ref 36.0–46.0)
Hemoglobin: 10 g/dL — ABNORMAL LOW (ref 12.0–15.0)
MCH: 28.7 pg (ref 26.0–34.0)
MCHC: 31.9 g/dL (ref 30.0–36.0)
MCV: 89.9 fL (ref 80.0–100.0)
Platelets: 211 10*3/uL (ref 150–400)
RBC: 3.48 MIL/uL — ABNORMAL LOW (ref 3.87–5.11)
RDW: 12.4 % (ref 11.5–15.5)
WBC: 14.8 10*3/uL — ABNORMAL HIGH (ref 4.0–10.5)
nRBC: 0 % (ref 0.0–0.2)

## 2020-12-19 LAB — GLUCOSE, CAPILLARY
Glucose-Capillary: 115 mg/dL — ABNORMAL HIGH (ref 70–99)
Glucose-Capillary: 144 mg/dL — ABNORMAL HIGH (ref 70–99)
Glucose-Capillary: 64 mg/dL — ABNORMAL LOW (ref 70–99)
Glucose-Capillary: 239 mg/dL — ABNORMAL HIGH (ref 70–99)

## 2020-12-19 LAB — APTT: aPTT: 23 seconds — ABNORMAL LOW (ref 24–36)

## 2020-12-19 MED ORDER — METOCLOPRAMIDE HCL 10 MG PO TABS
5.0000 mg | ORAL_TABLET | Freq: Three times a day (TID) | ORAL | Status: DC
  Administered 2020-12-19 – 2020-12-20 (×4): 5 mg via ORAL
  Filled 2020-12-19 (×4): qty 1

## 2020-12-19 MED ORDER — DIPHENHYDRAMINE HCL 25 MG PO CAPS
25.0000 mg | ORAL_CAPSULE | Freq: Four times a day (QID) | ORAL | Status: DC | PRN
  Administered 2020-12-19 (×2): 25 mg via ORAL
  Filled 2020-12-19 (×2): qty 1

## 2020-12-19 MED ORDER — METOCLOPRAMIDE HCL 5 MG/ML IJ SOLN
5.0000 mg | Freq: Three times a day (TID) | INTRAMUSCULAR | Status: DC
  Filled 2020-12-19: qty 2

## 2020-12-19 MED ORDER — SODIUM CHLORIDE 0.9 % IV SOLN: INTRAVENOUS | Status: DC

## 2020-12-19 NOTE — Progress Notes (Signed)
PROGRESS NOTE    Patient: Ann Moyer                            [facility]                    DOB: [birth year]            DOA: 12/18/2020 MWU:[id number]             DOS: 12/19/2020, 1:34 PM   LOS: 1 day   Date of Service: The patient was seen and examined on 12/19/2020  Subjective:   The patient was seen and examined this morning, ambulating, still complaining of nausea, p.o. intake has not improved, has not had any vomiting this morning, willing to advance her diet slowly    Brief Narrative:   Ann Moyer is a 30 y.o. female with medical history significant for type 1 diabetes mellitus, hypertension, anxiety, depression, CKD 3B and opiate dependence who presents to the emergency department due to several days of onset of abdominal pain, nausea and vomiting.  Abdominal pain was diffuse, sharp and rated as 9/10 on pain scale.  No alleviating factor known and this was aggravated with movement.  Vomitus was nonbloody, she states that she has not been able to tolerate any liquids or foods since onset of symptoms.  She was seen in the [facility] 2 days ago (9/12) and she was treated with IV fluids, antiemetics, pain medications and she was discharged from the [facility]. She was recently admitted from 8/14-8/19 due to same symptoms (abdominal pain, cyclical vomiting).   [facility] Course:  In the emergency department, she was tachycardic, BP was 175/115, she was hypoxic with O2 sat ranging within 86 to 88%, work-up done in the [facility] showed leukocytosis, normocytic anemia, hyponatremia, BUN started Nancyi 30/1.68 and hyperglycemia.  Lipase 20, FOBT was negative.  Influenza A, B, SARS coronavirus 2 was negative. Chest x-ray showed no evidence of acute cardiopulmonary disease Breathing treatment, antiemetic and IV medication were provided.  Hospitalist was asked to admit patient for further evaluation and management.   Assessment & Plan:   Principal Problem:   Intractable nausea and vomiting Active  Problems:   Chronic kidney disease   Hypertension   Opioid use with opioid-induced disorder (HCC)   Acute kidney injury superimposed on CKD (HCC)   Cyclic vomiting syndrome   Acute respiratory failure with hypoxia (HCC)   Hyperglycemia due to diabetes mellitus (HCC)   Leukocytosis   Hyponatremia   Marijuana use   Intractable nausea, vomiting (cyclical) and abdominal pain -Vomiting has improved, still complaining of nausea, abdominal discomfort, on clear liquid diet willing to advance her diet  Patient complained of recurrent abdominal pain, nausea and vomiting that has been ongoing for several days and she was seen in the [facility] 2 days ago (9/12) and was admitted to this hospital from 8/14-8/19 due to similar symptoms Gastroparesis was ruled out due to recent normal gastroesophageal study at [facility] per medical record -Status post IV fluid hydration -If tolerating p.o., we will continue Suboxone -Switching IV medication to p.o., Benadryl, Zofran, Phenergan, Reglan as needed  Continue clear liquid diet with plan to advance diet as tolerated  -We will consider CT of abdomen if no improvement Will consider GI consult  Consider GI consult for worsening of symptoms   Acute respiratory failure with hypoxia -Brief hypoxia on admission was satting 86-88% -Was subsequently was placed on 2 L of  oxygen, was weaned down to room air currently satting greater than 96%   Leukocytosis possibly reactive Likely reactive, afebrile, normotensive Currently no signs of infection, abstaining from antibiotics   Acute kidney injury on chronic kidney disease stage 3B Creatinine up to 1.68, this was 1.29 on 9/12 >>> 1.49 Discontinue IV fluids, encourage p.o. intake, hydration. Renally adjust medications, avoid nephrotoxic agents/dehydration/hypotension   Hyponatremia possibly due to dehydration -Improved, status post IV fluid hydration Continue to monitor sodium levels with morning labs   Marijuana  use Last THC was few days ago Patient counseled on marijuana cessation  Hyperglycemia secondary to T1DM Continue ISS and hypoglycemic protocol -Adjusting insulin, as p.o. intake improves  Opioid use with opioid-induced disorder Continue home Suboxone  Hypertensive Urgency/ Hypertension -Blood pressure has much improved BP was elevated in the setting of abdominal pain and possibly missed doses of an hypertensive due to nausea and vomiting  -Continue with as needed IV labetalol, IV hydralazine   Depression/anxiety  Continue home meds when patient is able to tolerate oral intake     DVT prophylaxis: Lovenox   Code Status: Full code   Family Communication: None at bedside   Disposition Plan:  Patient is from:                        home Anticipated DC to:                   home Anticipated DC date:              in 1 day .Marland Kitchen  Anticipated DC barriers:         Patient requires inpatient management due to recurrent abdominal pain and cyclical vomiting   Consults called: None   Admission status: Inpatient   ---------------------------------------------------------------------------------------------------------------------------- Cultures; None   Antimicrobials: None   Level of care: Med-Surg   Procedures:   No admission procedures for hospital encounter.    Antimicrobials:  Anti-infectives (From admission, onward)    None        Medication:   buprenorphine-naloxone  0.5 tablet Sublingual Q8H   dicyclomine  20 mg Oral BID   enoxaparin (LOVENOX) injection  40 mg Subcutaneous Q24H   insulin aspart  0-15 Units Subcutaneous TID WC   insulin glargine-yfgn  16 Units Subcutaneous QHS   metoCLOPramide  5 mg Oral TID AC   OLANZapine  5 mg Oral Daily   pantoprazole  40 mg Oral Daily   sertraline  50 mg Oral Daily    diphenhydrAMINE, ondansetron **OR** ondansetron (ZOFRAN) IV, SUMAtriptan   Objective:   Vitals:   12/18/20 1400 12/18/20 1759 12/18/20 2115  12/19/20 0205  BP: 120/82 128/86 (!) 144/90 128/76  Pulse: 97 88 90 89  Resp: 15 18 18 18   Temp:   98.3 F (36.8 C) 98.6 F (37 C)  TempSrc:      SpO2: 95% 99% 94% 97%  Weight:      Height:        Intake/Output Summary (Last 24 hours) at 12/19/2020 1334 Last data filed at 12/19/2020 1324 Gross per 24 hour  Intake 1560 ml  Output --  Net 1560 ml   Filed Weights   12/17/20 2329  Weight: 50 kg     Examination:      Physical Exam:   General:  Alert, oriented, cooperative, no distress;   HEENT:  Normocephalic, PERRL, otherwise with in Normal limits   Neuro:  CNII-XII intact. , normal motor  and sensation, reflexes intact   Lungs:   Clear to auscultation BL, Respirations unlabored, no wheezes / crackles  Cardio:    S1/S2, RRR, No murmure, No Rubs or Gallops   Abdomen:   Soft, diffuse nonspecific tenderness , bowel sounds active all four quadrants,  no guarding or peritoneal signs.  Muscular skeletal:  Limited exam - in bed, able to move all 4 extremities, Normal strength,  2+ pulses,  symmetric, No pitting edema  Skin:  Dry, warm to touch, negative for any Rashes,  Wounds: Please see nursing documentation         ------------------------------------------------------------------------------------------------------------------------------------------    LABs:  CBC Latest Ref Rng & Units 12/19/2020 12/18/2020 12/16/2020  WBC 4.0 - 10.5 K/uL 14.8(H) 13.7(H) 9.8  Hemoglobin 12.0 - 15.0 g/dL 10.0(L) 11.3(L) 12.7  Hematocrit 36.0 - 46.0 % 31.3(L) 33.9(L) 37.6  Platelets 150 - 400 K/uL 211 241 231   CMP Latest Ref Rng & Units 12/19/2020 12/18/2020 12/16/2020  Glucose 70 - 99 mg/dL 254(Y) 706(CB) 762(G)  BUN 6 - 20 mg/dL 31(D) 17(O) 16(W)  Creatinine 0.44 - 1.00 mg/dL 7.37(T) 0.62(I) 9.48(N)  Sodium 135 - 145 mmol/L 139 129(L) 135  Potassium 3.5 - 5.1 mmol/L 3.8 4.1 4.1  Chloride 98 - 111 mmol/L 107 97(L) 101  CO2 22 - 32 mmol/L 26 20(L) 25  Calcium 8.9 - 10.3 mg/dL 4.6(E)  7.0(J) 9.0  Total Protein 6.5 - 8.1 g/dL 5.0(K) 6.8 7.9  Total Bilirubin 0.3 - 1.2 mg/dL 0.3 0.7 0.6  Alkaline Phos 38 - 126 U/L 114 153(H) 186(H)  AST 15 - 41 U/L 21 27 28   ALT 0 - 44 U/L 17 24 25        Micro Results Recent Results (from the past 240 hour(s))  Resp Panel by RT-PCR (Flu A&B, Covid) Nasopharyngeal Swab     Status: None   Collection Time: 12/18/20  2:03 AM   Specimen: Nasopharyngeal Swab; Nasopharyngeal(NP) swabs in vial transport medium  Result Value Ref Range Status   SARS Coronavirus 2 by RT PCR NEGATIVE NEGATIVE Final    Comment: (NOTE) SARS-CoV-2 target nucleic acids are NOT DETECTED.  The SARS-CoV-2 RNA is generally detectable in upper respiratory specimens during the acute phase of infection. The lowest concentration of SARS-CoV-2 viral copies this assay can detect is 138 copies/mL. A negative result does not preclude SARS-Cov-2 infection and should not be used as the sole basis for treatment or other patient management decisions. A negative result may occur with  improper specimen collection/handling, submission of specimen other than nasopharyngeal swab, presence of viral mutation(s) within the areas targeted by this assay, and inadequate number of viral copies(<138 copies/mL). A negative result must be combined with clinical observations, patient history, and epidemiological information. The expected result is Negative.  Fact Sheet for Patients:   Fact Sheet for Healthcare Providers:  12/20/20  This test is no t yet approved or cleared by the [employer] FDA and  has been authorized for detection and/or diagnosis of SARS-CoV-2 by FDA under an Emergency Use Authorization (EUA). This EUA will remain  in effect (meaning this test can be used) for the duration of the COVID-19 declaration under Section 564(b)(1) of the Act, 21 U.S.C.section 360bbb-3(b)(1), unless the  authorization is terminated  or revoked sooner.       Influenza A by PCR NEGATIVE NEGATIVE Final   Influenza B by PCR NEGATIVE NEGATIVE Final    Comment: (NOTE) The Xpert Xpress SARS-CoV-2/FLU/RSV plus assay is intended as an  aid in the diagnosis of influenza from Nasopharyngeal swab specimens and should not be used as a sole basis for treatment. Nasal washings and aspirates are unacceptable for Xpert Xpress SARS-CoV-2/FLU/RSV testing.  Fact Sheet for Patients: [employer]  Fact Sheet for Healthcare Providers: [employer]ious[employer]ker[employer]it  This test is not yet approved or cleared by the [place] FDA and has been authorized for detection and/or diagnosis of SARS-CoV-2 by FDA under an Emergency Use Authorization (EUA). This EUA will remain in effect (meaning this test can be used) for the duration of the COVID-19 declaration under Section 564(b)(1) of the Act, 21 U.S.C. section 360bbb-3(b)(1), unless the authorization is terminated or revoked.  Performed at [facility], [address] [place], [place] [address]     Radiology Reports US RENAL  Result Date: 11/20/2020 CLINICAL DATA:  Acute kidney injury EXAM: RENAL / URINARY TRACT ULTRASOUND COMPLETE COMPARISON:  No prior ultrasound, correlation is made to CT abdomen pelvis 11/18/2020 FINDINGS: Right Kidney: Renal measurements: 11.8 x 4.0 x 5 3 cm = volume: 132 mL. Echogenicity within normal limits. No mass or hydronephrosis visualized. Left Kidney: Renal measurements: 11.6 x 4.5 x 4.8 cm = volume: 128 mL. Echogenicity within normal limits. No mass or hydronephrosis visualized. Bladder: The bladder is not well distended, limiting evaluation, although the wall does appear somewhat irregular, and measures to 0.8 cm. Other: None. IMPRESSION: 1. Normal kidneys bilaterally.  No hydronephrosis or mass. 2. Mild bladder wall irregularity, which may be due to underdistension of the bladder.  Electronically Signed   By: Wiliam Ke M.D.   On: 11/20/2020 14:30   DG Chest Port 1 View  Result Date: 12/18/2020 CLINICAL DATA:  Cough EXAM: PORTABLE CHEST 1 VIEW COMPARISON:  None. FINDINGS: Lungs are clear.  No pleural effusion or pneumothorax. The heart is normal in size. IMPRESSION: No evidence of acute cardiopulmonary disease. Electronically Signed   By: Charline Bills M.D.   On: 12/18/2020 02:29    SIGNED: Kendell Bane, MD, FHM. [facility],  Pager (please use [email or web address] to page/text) Please use Epic Secure Chat for non-urgent communication (7AM-7PM)  If 7PM-7AM, please contact night-coverage [email or web address], 12/19/2020, 1:34 PM

## 2020-12-19 NOTE — Progress Notes (Signed)
Inpatient Diabetes Program Recommendations  AACE/ADA: New Consensus Statement on Inpatient Glycemic Control   Target Ranges:  Prepandial:   less than 140 mg/dL      Peak postprandial:   less than 180 mg/dL (1-2 hours)      Critically ill patients:  140 - 180 mg/dL   Results for DIAMANTE, [id number] (MRN [id number]) as of 12/19/2020 09:04  Ref. Range 12/18/2020 07:01 12/18/2020 10:14 12/18/2020 13:07 12/18/2020 18:11 12/18/2020 21:45 12/19/2020 07:40  Glucose-Capillary Latest Ref Range: 70 - 99 mg/dL 503 (H) 888 (H) 280 (H) 145 (H) 153 (H) 115 (H)   Review of Glycemic Control  Diabetes history: DM1 (makes NO insulin; requires basal, correction, and carb coverage insulin) Outpatient Diabetes medications: Lantus 16 units QHS, Novolog 2-5 units BID Current orders for Inpatient glycemic control: Semglee 16 units QHS, Novolog 0-15 units TID  Inpatient Diabetes Program Recommendations:    Insulin: Please consider decreasing Novolog correction to 0-9 units TID, adding Novolog 0-5 units QHS, and ordering Novolog 2 units TID with meals for meal coverage if patient eats at least 50% of meals.  Thanks, Orlando Penner, RN, MSN, CDE Diabetes Coordinator Inpatient Diabetes Program [phone] (Team Pager from 8am to 5pm)

## 2020-12-20 LAB — BASIC METABOLIC PANEL
Anion gap: 9 (ref 5–15)
BUN: 20 mg/dL (ref 6–20)
CO2: 24 mmol/L (ref 22–32)
Calcium: 9 mg/dL (ref 8.9–10.3)
Chloride: 105 mmol/L (ref 98–111)
Creatinine, Ser: 1.34 mg/dL — ABNORMAL HIGH (ref 0.44–1.00)
GFR, Estimated: 54 mL/min — ABNORMAL LOW (ref >60)
Glucose, Bld: 125 mg/dL — ABNORMAL HIGH (ref 70–99)
Potassium: 3.8 mmol/L (ref 3.5–5.1)
Sodium: 138 mmol/L (ref 135–145)

## 2020-12-20 LAB — GLUCOSE, CAPILLARY
Glucose-Capillary: 147 mg/dL — ABNORMAL HIGH (ref 70–99)
Glucose-Capillary: 82 mg/dL (ref 70–99)

## 2020-12-20 MED ORDER — OMEPRAZOLE 20 MG PO CPDR: 20.0000 mg | DELAYED_RELEASE_CAPSULE | Freq: Two times a day (BID) | ORAL | 1 refills | Status: DC

## 2020-12-20 MED ORDER — PROMETHAZINE HCL 12.5 MG RE SUPP: 12.5000 mg | Freq: Four times a day (QID) | RECTAL | 1 refills | Status: DC | PRN

## 2020-12-20 MED ORDER — ONDANSETRON 4 MG PO TBDP: 4.0000 mg | ORAL_TABLET | Freq: Three times a day (TID) | ORAL | 0 refills | Status: AC | PRN

## 2020-12-20 MED ORDER — MAALOX MAX 400-400-40 MG/5ML PO SUSP: 10.0000 mL | Freq: Four times a day (QID) | ORAL | 0 refills | Status: AC | PRN

## 2020-12-20 NOTE — Progress Notes (Signed)
Nsg Discharge Note  Admit Date:  12/18/2020 Discharge date: 12/20/2020   Ed Blalock to be D/C'd Home per MD order.  AVS completed.  Patient able to verbalize understanding.  Discharge Medication: Allergies as of 12/20/2020       Reactions   Amitriptyline Swelling, Other (See Comments)   Legs and feet   Gabapentin Swelling   swelling   Pregabalin Swelling   swelling   Tramadol Itching, Swelling   Swelling, itching Eye and face swelling   Haloperidol Other (See Comments)   Uncontrollable movements  "uncontrolled movement of my body, like tremors"; previously charted to cause nausea and vomiting.     Toradol [ketorolac Tromethamine] Nausea Only   Ketorolac Itching   Metoclopramide Itching   Jittery and itchy Jittery and itchy        Medication List     STOP taking these medications    busPIRone 10 MG tablet Commonly known as: BUSPAR   pantoprazole 40 MG tablet Commonly known as: Protonix       TAKE these medications    albuterol 108 (90 Base) MCG/ACT inhaler Commonly known as: VENTOLIN HFA Inhale 2 puffs into the lungs every 4 (four) hours as needed for wheezing or shortness of breath.   amphetamine-dextroamphetamine 20 MG tablet Commonly known as: ADDERALL Take 20 mg by mouth daily.   buprenorphine-naloxone 8-2 mg Subl SL tablet Commonly known as: SUBOXONE Place 0.5 tablets under the tongue in the morning, at noon, in the evening, and at bedtime.   dicyclomine 20 MG tablet Commonly known as: BENTYL Take 1 tablet (20 mg total) by mouth 2 (two) times daily.   glucagon 1 MG injection Inject 1 mg into the vein once as needed (blood sugar).   insulin aspart 100 UNIT/ML FlexPen Commonly known as: NOVOLOG Inject 2-5 Units into the skin 2 (two) times daily before a meal. Sliding scale   insulin glargine 100 UNIT/ML Solostar Pen Commonly known as: LANTUS Inject 16 Units into the skin at bedtime.   Maalox Max 400-400-40 MG/5ML suspension Generic drug:  alum & mag hydroxide-simeth Take 10 mLs by mouth every 6 (six) hours as needed for indigestion.   OLANZapine 5 MG tablet Commonly known as: ZYPREXA Take 5 mg by mouth daily.   omeprazole 20 MG capsule Commonly known as: PRILOSEC Take 1 capsule (20 mg total) by mouth 2 (two) times daily before a meal. What changed: when to take this   ondansetron 4 MG disintegrating tablet Commonly known as: ZOFRAN-ODT Take 1 tablet (4 mg total) by mouth every 8 (eight) hours as needed for nausea or vomiting.   promethazine 12.5 MG suppository Commonly known as: PHENERGAN Place 1 suppository (12.5 mg total) rectally every 6 (six) hours as needed for up to 12 doses for nausea or vomiting.   sertraline 50 MG tablet Commonly known as: ZOLOFT Take 1 tablet by mouth daily.   SUMAtriptan Succinate Refill 6 MG/0.5ML Soct Inject 6 mg into the skin as needed (migraine).        Discharge Assessment: Vitals:   12/20/20 0521 12/20/20 1258  BP: 128/86 (!) 146/90  Pulse: 95 91  Resp: 19   Temp: 98.1 F (36.7 C) 98.2 F (36.8 C)  SpO2: 100% 94%   Skin clean, dry and intact without evidence of skin break down, no evidence of skin tears noted. IV catheter discontinued intact. Site without signs and symptoms of complications - no redness or edema noted at insertion site, patient denies c/o pain - only slight  tenderness at site.  Dressing with slight pressure applied.  D/c Instructions-Education: Discharge instructions given to patient with verbalized understanding. D/c education completed with patient including follow up instructions, medication list, d/c activities limitations if indicated, with other d/c instructions as indicated by MD - patient able to verbalize understanding, all questions fully answered. Patient instructed to return to ED, call 911, or call MD for any changes in condition.  Patient escorted via WC, and D/C home via private auto.  Jethro Poling, RN 12/20/2020 2:11 PM

## 2020-12-20 NOTE — Discharge Summary (Signed)
Physician Discharge Summary [facility]    Patient: Ann Moyer                   Admit date: 12/18/2020   DOB: 09-21-1990             Discharge date:12/20/2020/8:23 AM ZOX:[id number]                          [facility]  Disposition: HOME   Recommendations for Outpatient Follow-up:   Follow up: in 2 weeks with PCP   Discharge Condition: Stable   Code Status:   Code Status: Full Code  Diet recommendation: Diabetic diet   Discharge Diagnoses:    Principal Problem:   Intractable nausea and vomiting Active Problems:   Chronic kidney disease   Hypertension   Opioid use with opioid-induced disorder (HCC)   Acute kidney injury superimposed on CKD (HCC)   Cyclic vomiting syndrome   Acute respiratory failure with hypoxia (HCC)   Hyperglycemia due to diabetes mellitus (HCC)   Leukocytosis   Hyponatremia   Marijuana use   History of Present Illness/ Hospital Course Charline Bills Summary:   Ann Moyer is a 30 y.o. female with medical history significant for type 1 diabetes mellitus, hypertension, anxiety, depression, CKD 3B and opiate dependence who presents to the emergency department due to several days of onset of abdominal pain, nausea and vomiting.  Abdominal pain was diffuse, sharp and rated as 9/10 on pain scale.  No alleviating factor known and this was aggravated with movement.  Vomitus was nonbloody, she states that she has not been able to tolerate any liquids or foods since onset of symptoms.  She was seen in the ED 2 days ago (9/12) and she was treated with IV fluids, antiemetics, pain medications and she was discharged from the ED. She was recently admitted from 8/14-8/19 due to same symptoms (abdominal pain, cyclical vomiting).   ED Course:  In the emergency department, she was tachycardic, BP was 175/115, she was hypoxic with O2 sat ranging within 86 to 88%, work-up done in the ED showed leukocytosis, normocytic anemia, hyponatremia, BUN started [place]  30/1.68 and hyperglycemia.  Lipase 20, FOBT was negative.  Influenza A, B, SARS coronavirus 2 was negative. Chest x-ray showed no evidence of acute cardiopulmonary disease Breathing treatment, antiemetic and IV medication were provided.  Hospitalist was asked to admit patient for further evaluation and management.      Intractable nausea, vomiting (cyclical) and abdominal pain -Improved nausea, vomiting, and abdominal pain   Patient complained of recurrent abdominal pain, nausea and vomiting that has been ongoing for several days and she was seen in the ED 2 days ago (9/12) and was admitted to this hospital from 8/14-8/19 due to similar symptoms Gastroparesis was ruled out due to recent normal gastroesophageal study at [facility] per medical record -S/P IV NS .Marland Kitchen.  -tolerating p.o., cont to advace diet ----  continue home meds including Suboxone - IV Toradol and IV Benadryl was given... now D/Ced  Continue Zofran/Phenergan p.r.n. -was treated with IV Reglan  - Advanced diet-- tolerated      Acute respiratory failure with hypoxia Resolved  She was noted to be hypoxic with O2 sat of 86-88% Continue supplemental oxygen at 2 LPM via [place] with plan to wean patient off supplemental oxygen as tolerated... Currently 96% on room air  Leukocytosis possibly reactive WBC 13.7, continue to monitor WBC with morning labs Currently no signs of  infection, abstaining from antibiotics   Acute kidney injury on chronic kidney disease stage 3B Creatinine up to 1.68, this was 1.29 on 9/12 >>1.49 S/p IV fluid hydration,    Hyponatremia possibly due to dehydration Appears dehydrated, responded to IVF  Resolved    Marijuana use Last THC was few days ago Patient counseled on marijuana cessation  Hyperglycemia secondary to T1DM Resumed Home Insuline with SSI   Opioid use with opioid-induced disorder Continue home Suboxone  Hypertensive Urgency/ Hypertension BP was elevated in the setting of abdominal pain  and possibly missed doses of an hypertensive due to nausea and vomiting -Restarting oral p.o. medications Improved    Depression/anxiety  Continue home meds        Code Status: Full code   Family Communication: None at bedside   Disposition Plan:  Patient is from:                        home Anticipated DC to:                   home     Discharge Instructions:   Discharge Instructions     Activity as tolerated - No restrictions   Complete by: As directed    Call MD for:  difficulty breathing, headache or visual disturbances   Complete by: As directed    Call MD for:  persistant nausea and vomiting   Complete by: As directed    Call MD for:  redness, tenderness, or signs of infection (pain, swelling, redness, odor or green/yellow discharge around incision site)   Complete by: As directed    Diet Carb Modified   Complete by: As directed    Diet full liquid   Complete by: As directed    Discharge instructions   Complete by: As directed    Follow up with PCP and GI in 2-3 weeks   Increase activity slowly   Complete by: As directed         Medication List     STOP taking these medications    busPIRone 10 MG tablet Commonly known as: BUSPAR   pantoprazole 40 MG tablet Commonly known as: Protonix       TAKE these medications    albuterol 108 (90 Base) MCG/ACT inhaler Commonly known as: VENTOLIN HFA Inhale 2 puffs into the lungs every 4 (four) hours as needed for wheezing or shortness of breath.   amphetamine-dextroamphetamine 20 MG tablet Commonly known as: ADDERALL Take 20 mg by mouth daily.   buprenorphine-naloxone 8-2 mg Subl SL tablet Commonly known as: SUBOXONE Place 0.5 tablets under the tongue in the morning, at noon, in the evening, and at bedtime.   dicyclomine 20 MG tablet Commonly known as: BENTYL Take 1 tablet (20 mg total) by mouth 2 (two) times daily.   glucagon 1 MG injection Inject 1 mg into the vein once as needed (blood  sugar).   insulin aspart 100 UNIT/ML FlexPen Commonly known as: NOVOLOG Inject 2-5 Units into the skin 2 (two) times daily before a meal. Sliding scale   insulin glargine 100 UNIT/ML Solostar Pen Commonly known as: LANTUS Inject 16 Units into the skin at bedtime.   Maalox Max 400-400-40 MG/5ML suspension Generic drug: alum & mag hydroxide-simeth Take 10 mLs by mouth every 6 (six) hours as needed for indigestion.   OLANZapine 5 MG tablet Commonly known as: ZYPREXA Take 5 mg by mouth daily.   omeprazole 20 MG capsule Commonly  known as: PRILOSEC Take 1 capsule (20 mg total) by mouth 2 (two) times daily before a meal. What changed: when to take this   ondansetron 4 MG disintegrating tablet Commonly known as: ZOFRAN-ODT Take 1 tablet (4 mg total) by mouth every 8 (eight) hours as needed for nausea or vomiting.   promethazine 12.5 MG suppository Commonly known as: PHENERGAN Place 1 suppository (12.5 mg total) rectally every 6 (six) hours as needed for up to 12 doses for nausea or vomiting.   sertraline 50 MG tablet Commonly known as: ZOLOFT Take 1 tablet by mouth daily.   SUMAtriptan Succinate Refill 6 MG/0.5ML Soct Inject 6 mg into the skin as needed (migraine).        Allergies  Allergen Reactions   Amitriptyline Swelling and Other (See Comments)    Legs and feet    Gabapentin Swelling    swelling   Pregabalin Swelling    swelling   Tramadol Itching and Swelling    Swelling, itching Eye and face swelling    Haloperidol Other (See Comments)    Uncontrollable movements  "uncontrolled movement of my body, like tremors"; previously charted to cause nausea and vomiting.     Toradol [Ketorolac Tromethamine] Nausea Only   Ketorolac Itching   Metoclopramide Itching    Jittery and itchy Jittery and itchy      Procedures /Studies:   US RENAL  Result Date: 11/20/2020 CLINICAL DATA:  Acute kidney injury EXAM: RENAL / URINARY TRACT ULTRASOUND COMPLETE COMPARISON:   No prior ultrasound, correlation is made to CT abdomen pelvis 11/18/2020 FINDINGS: Right Kidney: Renal measurements: 11.8 x 4.0 x 5 3 cm = volume: 132 mL. Echogenicity within normal limits. No mass or hydronephrosis visualized. Left Kidney: Renal measurements: 11.6 x 4.5 x 4.8 cm = volume: 128 mL. Echogenicity within normal limits. No mass or hydronephrosis visualized. Bladder: The bladder is not well distended, limiting evaluation, although the wall does appear somewhat irregular, and measures to 0.8 cm. Other: None. IMPRESSION: 1. Normal kidneys bilaterally.  No hydronephrosis or mass. 2. Mild bladder wall irregularity, which may be due to underdistension of the bladder. Electronically Signed   By: Wiliam Ke M.D.   On: 11/20/2020 14:30   DG Chest Port 1 View  Result Date: 12/18/2020 CLINICAL DATA:  Cough EXAM: PORTABLE CHEST 1 VIEW COMPARISON:  None. FINDINGS: Lungs are clear.  No pleural effusion or pneumothorax. The heart is normal in size. IMPRESSION: No evidence of acute cardiopulmonary disease. Electronically Signed   By: Charline Bills M.D.   On: 12/18/2020 02:29    Subjective:   Patient was seen and examined 12/20/2020, 8:23 AM Patient stable today. No acute distress.  No issues overnight Stable for discharge.  Discharge Exam:    Vitals:   12/19/20 0205 12/19/20 1343 12/19/20 2033 12/20/20 0521  BP: 128/76 (!) 153/100 (!) 133/95 128/86  Pulse: 89 97 95 95  Resp: 18 (!) 22 19 19   Temp: 98.6 F (37 C) 98.1 F (36.7 C) 98.1 F (36.7 C) 98.1 F (36.7 C)  TempSrc:  Oral Oral Oral  SpO2: 97% 91% 100% 100%  Weight:      Height:        General: Pt lying comfortably in bed & appears in no obvious distress. Cardiovascular: S1 & S2 heard, RRR, S1/S2 +. No murmurs, rubs, gallops or clicks. No JVD or pedal edema. Respiratory: Clear to auscultation without wheezing, rhonchi or crackles. No increased work of breathing. Abdominal:  Non-distended, non-tender & soft.  No organomegaly  or masses appreciated. Normal bowel sounds heard. CNS: Alert and oriented. No focal deficits. Extremities: no edema, no cyanosis      The results of significant diagnostics from this hospitalization (including imaging, microbiology, ancillary and laboratory) are listed below for reference.      Microbiology:   Recent Results (from the past 240 hour(s))  Resp Panel by RT-PCR (Flu A&B, Covid) Nasopharyngeal Swab     Status: None   Collection Time: 12/18/20  2:03 AM   Specimen: Nasopharyngeal Swab; Nasopharyngeal(NP) swabs in vial transport medium  Result Value Ref Range Status   SARS Coronavirus 2 by RT PCR NEGATIVE NEGATIVE Final    Comment: (NOTE) SARS-CoV-2 target nucleic acids are NOT DETECTED.  The SARS-CoV-2 RNA is generally detectable in upper respiratory specimens during the acute phase of infection. The lowest concentration of SARS-CoV-2 viral copies this assay can detect is 138 copies/mL. A negative result does not preclude SARS-Cov-2 infection and should not be used as the sole basis for treatment or other patient management decisions. A negative result may occur with  improper specimen collection/handling, submission of specimen other than nasopharyngeal swab, presence of viral mutation(s) within the areas targeted by this assay, and inadequate number of viral copies(<138 copies/mL). A negative result must be combined with clinical observations, patient history, and epidemiological information. The expected result is Negative.  Fact Sheet for Patients:  BloggerCourse.com  Fact Sheet for Healthcare Providers:  SeriousBroker.it  This test is no t yet approved or cleared by the [place] FDA and  has been authorized for detection and/or diagnosis of SARS-CoV-2 by FDA under an Emergency Use Authorization (EUA). This EUA will remain  in effect (meaning this test can be used) for the duration of the COVID-19  declaration under Section 564(b)(1) of the Act, 21 U.S.C.section 360bbb-3(b)(1), unless the authorization is terminated  or revoked sooner.       Influenza A by PCR NEGATIVE NEGATIVE Final   Influenza B by PCR NEGATIVE NEGATIVE Final    Comment: (NOTE) The Xpert Xpress SARS-CoV-2/FLU/RSV plus assay is intended as an aid in the diagnosis of influenza from Nasopharyngeal swab specimens and should not be used as a sole basis for treatment. Nasal washings and aspirates are unacceptable for Xpert Xpress SARS-CoV-2/FLU/RSV testing.  Fact Sheet for Patients: BloggerCourse.com  Fact Sheet for Healthcare Providers: SeriousBroker.it  This test is not yet approved or cleared by the [place] FDA and has been authorized for detection and/or diagnosis of SARS-CoV-2 by FDA under an Emergency Use Authorization (EUA). This EUA will remain in effect (meaning this test can be used) for the duration of the COVID-19 declaration under Section 564(b)(1) of the Act, 21 U.S.C. section 360bbb-3(b)(1), unless the authorization is terminated or revoked.  Performed at [facility], [address], [place], [place] [address]      Labs:   CBC: Recent Labs  Lab 12/16/20 1640 12/18/20 0013 12/19/20 0516  WBC 9.8 13.7* 14.8*  NEUTROABS 8.9*  --   --   HGB 12.7 11.3* 10.0*  HCT 37.6 33.9* 31.3*  MCV 88.9 89.4 89.9  PLT 231 241 211   Basic Metabolic Panel: Recent Labs  Lab 12/16/20 1640 12/18/20 0013 12/19/20 0516  NA 135 129* 139  K 4.1 4.1 3.8  CL 101 97* 107  CO2 25 20* 26  GLUCOSE 313* 507* 111*  BUN 24* 30* 29*  CREATININE 1.29* 1.68* 1.49*  CALCIUM 9.0 8.5* 8.4*  MG  --  2.1  --  PHOS  --  3.5  --    Liver Function Tests: Recent Labs  Lab 12/16/20 1640 12/18/20 0013 12/19/20 0516  AST 28 27 21   ALT 25 24 17   ALKPHOS 186* 153* 114  BILITOT 0.6 0.7 0.3  PROT 7.9 6.8 5.5*  ALBUMIN 3.7 3.4* 2.6*   BNP (last 3  results) No results for input(s): BNP in the last 8760 hours. Cardiac Enzymes: No results for input(s): CKTOTAL, CKMB, CKMBINDEX, TROPONINI in the last 168 hours. CBG: Recent Labs  Lab 12/19/20 0740 12/19/20 1108 12/19/20 1624 12/19/20 2026 12/20/20 0754  GLUCAP 115* 144* 64* 239* 147*   Hgb A1c Recent Labs    12/18/20 0013  HGBA1C 11.5*   Lipid PUrinalysis    Component Value Date/Time   COLORURINE YELLOW 11/20/2020 1031   APPEARANCEUR HAZY (A) 11/20/2020 1031   LABSPEC 1.011 11/20/2020 1031   PHURINE 6.0 11/20/2020 1031   GLUCOSEU NEGATIVE 11/20/2020 1031   HGBUR SMALL (A) 11/20/2020 1031   BILIRUBINUR NEGATIVE 11/20/2020 1031   KETONESUR NEGATIVE 11/20/2020 1031   PROTEINUR 100 (A) 11/20/2020 1031   NITRITE NEGATIVE 11/20/2020 1031   LEUKOCYTESUR NEGATIVE 11/20/2020 1031         Time coordinating discharge: Over 45 minutes  SIGNED: 11/22/2020, MD, FACP, FHM. [facility],  Please use [email or web address] to Page If 7PM-7AM, please contact night-coverage [email or web address] [facility] 12/20/2020, 8:23 AM

## 2020-12-20 NOTE — Progress Notes (Signed)
Inpatient Diabetes Program Recommendations  AACE/ADA: New Consensus Statement on Inpatient Glycemic Control (2015)  Target Ranges:  Prepandial:   less than 140 mg/dL      Peak postprandial:   less than 180 mg/dL (1-2 hours)      Critically ill patients:  140 - 180 mg/dL   Lab Results  Component Value Date   GLUCAP 147 (H) 12/20/2020   HGBA1C 11.5 (H) 12/18/2020    Review of Glycemic Control Results for Ann Moyer, Ann Moyer (MRN [id number]) as of 12/20/2020 10:20  Ref. Range 12/19/2020 07:40 12/19/2020 11:08 12/19/2020 16:24 12/19/2020 20:26 12/20/2020 07:54  Glucose-Capillary Latest Ref Range: 70 - 99 mg/dL 833 (H) 825 (H) 64 (L) 239 (H) 147 (H)  Diabetes history: DM1 (makes NO insulin; requires basal, correction, and carb coverage insulin) Outpatient Diabetes medications: Lantus 16 units QHS, Novolog 2-5 units BID Current orders for Inpatient glycemic control: Semglee 16 units QHS, Novolog 0-15 units TID  Inpatient Diabetes Program Recommendations:    Consider reducing Novolog correction to "very Sensitive" 0-6 units tid with meals and add Novolog 2 units tid with meals (hold if patient eats less than 50% or NPO).   Thanks,  Beryl Meager, RN, BC-ADM Inpatient Diabetes Coordinator Pager [phone]  (8a-5p)

## 2021-04-12 NOTE — Progress Notes (Deleted)
Referring Provider: [facility] Physician:  [facility] Primary GI Physician: Dr. Abbey Chatters  No chief complaint on file.   HPI:   Ann Moyer is a 31 y.o. female with history of HTN, CKD, type 1 diabetes, chronic marijuana use, history of opioid use disorder and followed by [facility] with Suboxone prescribed, chronic intermittent nausea/vomiting and recurrent upper abdominal pain, possible gastroparesis with abnormal gastric emptying study in 2014, but follow-up gastric emptying studies have been normal, LA Grade D esophagitis April 2022 with Candida, presenting today for hospital follow-up.  We saw patient during hospitalization in August 2022 for recurrent acute onset diffuse abdominal pain with associated nausea/vomiting and possible hematemesis per patient.  Patient reported her issues with recurrent pain, nausea, vomiting started in 2014.  Reported being on Reglan in the past which caused "twitching".  CT A/P with mild fat stranding adjacent to the distal descending colon, non-specific.  Slight elevation of alk phos which later normalized.  Other LFTs within normal limits and lipase normal.  Underwent EGD with normal-appearing esophagus, patchy gastric erosion and erythema of doubtful clinical significance s/p biopsy, normal examined duodenum.  Pathology with antral and oxyntic mucosa with mild changes of reactive gastropathy and slight chronic inflammation, negative for H. pylori.  She improved with supportive measures, Zofran, PPI twice daily.  Overall, suspected symptoms were likely multifactorial: possible component of gastroparesis in the setting of uncontrolled diabetes, possible Cannabis hyperemesis syndrome, and unable to rule out biliary dyskinesia considering abdominal pain and gallbladder in situ.  Today:   Past Medical History:  Diagnosis Date   Asthma    Borderline personality disorder ([place])    CKD (chronic kidney disease)    Diabetes mellitus without  complication ([place])    Opioid use disorder     Past Surgical History:  Procedure Laterality Date   BIOPSY  07/13/2020   Procedure: BIOPSY;  Surgeon: Eloise Harman, DO;  [facility];  Service: Endoscopy;;  gastric, esophageal   BIOPSY  11/21/2020   Procedure: BIOPSY;  Surgeon: Daneil Dolin, MD;  [facility];  Service: Endoscopy;;   ESOPHAGOGASTRODUODENOSCOPY (EGD) WITH PROPOFOL N/A 07/13/2020   LA Grade D erosive esophagitis without bleeding, gastritis s/p biopsy, normal duodenum. Found to have candida esophagitis at that time   ESOPHAGOGASTRODUODENOSCOPY (EGD) WITH PROPOFOL N/A 11/21/2020   Procedure: ESOPHAGOGASTRODUODENOSCOPY (EGD) WITH PROPOFOL;  Surgeon: Daneil Dolin, MD;  [facility];  Service: Endoscopy;  Laterality: N/A;   EYE SURGERY      Current Outpatient Medications  Medication Sig Dispense Refill   albuterol (VENTOLIN HFA) 108 (90 Base) MCG/ACT inhaler Inhale 2 puffs into the lungs every 4 (four) hours as needed for wheezing or shortness of breath.     amphetamine-dextroamphetamine (ADDERALL) 20 MG tablet Take 20 mg by mouth daily.     buprenorphine-naloxone (SUBOXONE) 8-2 mg SUBL SL tablet Place 0.5 tablets under the tongue in the morning, at noon, in the evening, and at bedtime.     dicyclomine (BENTYL) 20 MG tablet Take 1 tablet (20 mg total) by mouth 2 (two) times daily. 20 tablet 0   glucagon 1 MG injection Inject 1 mg into the vein once as needed (blood sugar).     insulin aspart (NOVOLOG) 100 UNIT/ML FlexPen Inject 2-5 Units into the skin 2 (two) times daily before a meal. Sliding scale     insulin glargine (LANTUS) 100 UNIT/ML Solostar Pen Inject 16 Units into the skin at bedtime.  OLANZapine (ZYPREXA) 5 MG tablet Take 5 mg by mouth daily.     omeprazole (PRILOSEC) 20 MG capsule Take 1 capsule (20 mg total) by mouth 2 (two) times daily before a meal. 60 capsule 1   promethazine (PHENERGAN) 12.5 MG suppository Place 1  suppository (12.5 mg total) rectally every 6 (six) hours as needed for up to 12 doses for nausea or vomiting. 12 suppository 1   sertraline (ZOLOFT) 50 MG tablet Take 1 tablet by mouth daily.     SUMAtriptan Succinate Refill 6 MG/0.5ML SOCT Inject 6 mg into the skin as needed (migraine).     No current facility-administered medications for this visit.    Allergies as of 04/14/2021 - Review Complete 12/19/2020  Allergen Reaction Noted   Amitriptyline Swelling and Other (See Comments) 08/07/2013   Gabapentin Swelling 09/10/2012   Pregabalin Swelling 06/02/2012   Tramadol Itching and Swelling 01/08/2011   Haloperidol Other (See Comments) 03/18/2017   Toradol [ketorolac tromethamine] Nausea Only 02/19/2019   Ketorolac Itching 03/23/2014   Metoclopramide Itching 10/20/2013    No family history on file.  Social History   Socioeconomic History   Marital status: Single    Spouse name: Not on file   Number of children: Not on file   Years of education: Not on file   Highest education level: Not on file  Occupational History   Not on file  Tobacco Use   Smoking status: Every Day    Packs/day: 0.50    Types: Cigarettes   Smokeless tobacco: Never  Substance and Sexual Activity   Alcohol use: Not Currently   Drug use: Yes    Types: Marijuana    Comment: daily   Sexual activity: Not on file  Other Topics Concern   Not on file  Social History Narrative   Not on file   Social Determinants of Health   Financial Resource Strain: Not on file  Food Insecurity: Not on file  Transportation Needs: Not on file  Physical Activity: Not on file  Stress: Not on file  Social Connections: Not on file    Review of Systems: Gen: Denies fever, chills, anorexia. Denies fatigue, weakness, weight loss.  CV: Denies chest pain, palpitations, syncope, peripheral edema, and claudication. Resp: Denies dyspnea at rest, cough, wheezing, coughing up blood, and pleurisy. GI: Denies vomiting blood,  jaundice, and fecal incontinence.   Denies dysphagia or odynophagia. Derm: Denies rash, itching, dry skin Psych: Denies depression, anxiety, memory loss, confusion. No homicidal or suicidal ideation.  Heme: Denies bruising, bleeding, and enlarged lymph nodes.  Physical Exam: There were no vitals taken for this visit. General:   Alert and oriented. No distress noted. Pleasant and cooperative.  Head:  Normocephalic and atraumatic. Eyes:  Conjuctiva clear without scleral icterus. Mouth:  Oral mucosa pink and moist. Good dentition. No lesions. Heart:  S1, S2 present without murmurs appreciated. Lungs:  Clear to auscultation bilaterally. No wheezes, rales, or rhonchi. No distress.  Abdomen:  +BS, soft, non-tender and non-distended. No rebound or guarding. No HSM or masses noted. Msk:  Symmetrical without gross deformities. Normal posture. Extremities:  Without edema. Neurologic:  Alert and  oriented x4 Psych:  Alert and cooperative. Normal mood and affect.

## 2021-04-14 ENCOUNTER — Encounter: Payer: Self-pay | Admitting: Gastroenterology

## 2021-04-14 ENCOUNTER — Ambulatory Visit: Payer: Medicaid Other | Admitting: Gastroenterology

## 2022-03-10 ENCOUNTER — Inpatient Hospital Stay (HOSPITAL_COMMUNITY)
Admission: EM | Admit: 2022-03-10 | Discharge: 2022-03-12 | DRG: 684 | Disposition: A | Discharge status: Home / Self Care | Payer: Medicaid Other | Attending: Family Medicine | Admitting: Family Medicine

## 2022-03-10 ENCOUNTER — Encounter (HOSPITAL_COMMUNITY): Payer: Self-pay | Admitting: Emergency Medicine

## 2022-03-10 ENCOUNTER — Other Ambulatory Visit: Payer: Self-pay

## 2022-03-10 DIAGNOSIS — Z885 Allergy status to narcotic agent status: Secondary | ICD-10-CM | POA: Diagnosis not present

## 2022-03-10 DIAGNOSIS — F419 Anxiety disorder, unspecified: Secondary | ICD-10-CM | POA: Diagnosis present

## 2022-03-10 DIAGNOSIS — R748 Abnormal levels of other serum enzymes: Secondary | ICD-10-CM

## 2022-03-10 DIAGNOSIS — F1290 Cannabis use, unspecified, uncomplicated: Secondary | ICD-10-CM

## 2022-03-10 DIAGNOSIS — N189 Chronic kidney disease, unspecified: Secondary | ICD-10-CM | POA: Diagnosis not present

## 2022-03-10 DIAGNOSIS — F1129 Opioid dependence with unspecified opioid-induced disorder: Secondary | ICD-10-CM | POA: Diagnosis present

## 2022-03-10 DIAGNOSIS — Z79899 Other long term (current) drug therapy: Secondary | ICD-10-CM

## 2022-03-10 DIAGNOSIS — Z888 Allergy status to other drugs, medicaments and biological substances status: Secondary | ICD-10-CM | POA: Diagnosis not present

## 2022-03-10 DIAGNOSIS — Z91041 Radiographic dye allergy status: Secondary | ICD-10-CM | POA: Diagnosis not present

## 2022-03-10 DIAGNOSIS — N179 Acute kidney failure, unspecified: Principal | ICD-10-CM | POA: Diagnosis present

## 2022-03-10 DIAGNOSIS — E860 Dehydration: Secondary | ICD-10-CM | POA: Diagnosis not present

## 2022-03-10 DIAGNOSIS — F418 Other specified anxiety disorders: Secondary | ICD-10-CM | POA: Diagnosis present

## 2022-03-10 DIAGNOSIS — J45909 Unspecified asthma, uncomplicated: Secondary | ICD-10-CM | POA: Diagnosis present

## 2022-03-10 DIAGNOSIS — R112 Nausea with vomiting, unspecified: Secondary | ICD-10-CM | POA: Diagnosis present

## 2022-03-10 DIAGNOSIS — R772 Abnormality of alphafetoprotein: Secondary | ICD-10-CM | POA: Diagnosis present

## 2022-03-10 DIAGNOSIS — I160 Hypertensive urgency: Secondary | ICD-10-CM | POA: Diagnosis present

## 2022-03-10 DIAGNOSIS — R109 Unspecified abdominal pain: Secondary | ICD-10-CM | POA: Diagnosis present

## 2022-03-10 DIAGNOSIS — F32A Depression, unspecified: Secondary | ICD-10-CM

## 2022-03-10 DIAGNOSIS — E1022 Type 1 diabetes mellitus with diabetic chronic kidney disease: Secondary | ICD-10-CM | POA: Diagnosis present

## 2022-03-10 DIAGNOSIS — E109 Type 1 diabetes mellitus without complications: Secondary | ICD-10-CM | POA: Diagnosis present

## 2022-03-10 DIAGNOSIS — F17210 Nicotine dependence, cigarettes, uncomplicated: Secondary | ICD-10-CM | POA: Diagnosis present

## 2022-03-10 DIAGNOSIS — Z794 Long term (current) use of insulin: Secondary | ICD-10-CM | POA: Diagnosis not present

## 2022-03-10 DIAGNOSIS — N1832 Chronic kidney disease, stage 3b: Secondary | ICD-10-CM | POA: Diagnosis present

## 2022-03-10 DIAGNOSIS — I129 Hypertensive chronic kidney disease with stage 1 through stage 4 chronic kidney disease, or unspecified chronic kidney disease: Secondary | ICD-10-CM | POA: Diagnosis present

## 2022-03-10 DIAGNOSIS — F1199 Opioid use, unspecified with unspecified opioid-induced disorder: Secondary | ICD-10-CM | POA: Diagnosis not present

## 2022-03-10 DIAGNOSIS — E1065 Type 1 diabetes mellitus with hyperglycemia: Secondary | ICD-10-CM | POA: Diagnosis present

## 2022-03-10 DIAGNOSIS — F603 Borderline personality disorder: Secondary | ICD-10-CM | POA: Diagnosis present

## 2022-03-10 DIAGNOSIS — R1115 Cyclical vomiting syndrome unrelated to migraine: Principal | ICD-10-CM

## 2022-03-10 DIAGNOSIS — R739 Hyperglycemia, unspecified: Secondary | ICD-10-CM

## 2022-03-10 LAB — COMPREHENSIVE METABOLIC PANEL
ALT: 35 U/L (ref 0–44)
AST: 35 U/L (ref 15–41)
Albumin: 3.3 g/dL — ABNORMAL LOW (ref 3.5–5.0)
Alkaline Phosphatase: 255 U/L — ABNORMAL HIGH (ref 38–126)
Anion gap: 10 (ref 5–15)
BUN: 24 mg/dL — ABNORMAL HIGH (ref 6–20)
CO2: 20 mmol/L — ABNORMAL LOW (ref 22–32)
Calcium: 9.2 mg/dL (ref 8.9–10.3)
Chloride: 105 mmol/L (ref 98–111)
Creatinine, Ser: 1.77 mg/dL — ABNORMAL HIGH (ref 0.44–1.00)
GFR, Estimated: 39 mL/min — ABNORMAL LOW (ref >60)
Glucose, Bld: 276 mg/dL — ABNORMAL HIGH (ref 70–99)
Potassium: 5 mmol/L (ref 3.5–5.1)
Sodium: 135 mmol/L (ref 135–145)
Total Bilirubin: 0.4 mg/dL (ref 0.3–1.2)
Total Protein: 7.1 g/dL (ref 6.5–8.1)

## 2022-03-10 LAB — RAPID URINE DRUG SCREEN, HOSP PERFORMED
Amphetamines: NOT DETECTED
Barbiturates: NOT DETECTED
Benzodiazepines: NOT DETECTED
Cocaine: NOT DETECTED
Opiates: NOT DETECTED
Tetrahydrocannabinol: POSITIVE — AB

## 2022-03-10 LAB — CBC
HCT: 37.9 % (ref 36.0–46.0)
Hemoglobin: 12.7 g/dL (ref 12.0–15.0)
MCH: 28.5 pg (ref 26.0–34.0)
MCHC: 33.5 g/dL (ref 30.0–36.0)
MCV: 85 fL (ref 80.0–100.0)
Platelets: 277 10*3/uL (ref 150–400)
RBC: 4.46 MIL/uL (ref 3.87–5.11)
RDW: 12.4 % (ref 11.5–15.5)
WBC: 11.2 10*3/uL — ABNORMAL HIGH (ref 4.0–10.5)
nRBC: 0 % (ref 0.0–0.2)

## 2022-03-10 LAB — URINALYSIS, ROUTINE W REFLEX MICROSCOPIC
Bilirubin Urine: NEGATIVE
Glucose, UA: 150 mg/dL — AB
Ketones, ur: NEGATIVE mg/dL
Leukocytes,Ua: NEGATIVE
Nitrite: NEGATIVE
Protein, ur: >=300 mg/dL — AB
Specific Gravity, Urine: 1.013 (ref 1.005–1.030)
pH: 6 (ref 5.0–8.0)

## 2022-03-10 LAB — CBG MONITORING, ED: Glucose-Capillary: 289 mg/dL — ABNORMAL HIGH (ref 70–99)

## 2022-03-10 LAB — LIPASE, BLOOD: Lipase: 33 U/L (ref 11–51)

## 2022-03-10 LAB — PREGNANCY, URINE: Preg Test, Ur: NEGATIVE

## 2022-03-10 MED ORDER — ONDANSETRON HCL 4 MG/2ML IJ SOLN
4.0000 mg | Freq: Once | INTRAMUSCULAR | Status: AC
  Administered 2022-03-10: 4 mg via INTRAVENOUS
  Filled 2022-03-10: qty 2

## 2022-03-10 MED ORDER — DROPERIDOL 2.5 MG/ML IJ SOLN
INTRAMUSCULAR | Status: AC
  Filled 2022-03-10: qty 2

## 2022-03-10 MED ORDER — SODIUM CHLORIDE 0.9 % IV BOLUS
1000.0000 mL | Freq: Once | INTRAVENOUS | Status: AC
  Administered 2022-03-10: 1000 mL via INTRAVENOUS

## 2022-03-10 MED ORDER — KETOROLAC TROMETHAMINE 15 MG/ML IJ SOLN
15.0000 mg | Freq: Once | INTRAMUSCULAR | Status: AC
  Administered 2022-03-10: 15 mg via INTRAVENOUS
  Filled 2022-03-10: qty 1

## 2022-03-10 MED ORDER — LORAZEPAM 2 MG/ML IJ SOLN
1.0000 mg | Freq: Once | INTRAMUSCULAR | Status: AC
  Administered 2022-03-10: 1 mg via INTRAVENOUS
  Filled 2022-03-10: qty 1

## 2022-03-10 MED ORDER — DROPERIDOL 2.5 MG/ML IJ SOLN
2.5000 mg | Freq: Once | INTRAMUSCULAR | Status: AC
  Administered 2022-03-10: 2.5 mg via INTRAVENOUS

## 2022-03-10 MED ORDER — DIPHENHYDRAMINE HCL 50 MG/ML IJ SOLN
50.0000 mg | Freq: Once | INTRAMUSCULAR | Status: AC
  Administered 2022-03-10: 50 mg via INTRAVENOUS
  Filled 2022-03-10: qty 1

## 2022-03-10 MED ORDER — PROMETHAZINE HCL 25 MG/ML IJ SOLN
INTRAMUSCULAR | Status: AC
  Filled 2022-03-10: qty 1

## 2022-03-10 MED ORDER — SODIUM CHLORIDE 0.9 % IV SOLN
12.5000 mg | Freq: Once | INTRAVENOUS | Status: AC
  Administered 2022-03-10: 12.5 mg via INTRAVENOUS
  Filled 2022-03-10: qty 0.5

## 2022-03-10 NOTE — ED Provider Notes (Signed)
[facility] EMERGENCY DEPARTMENT Provider Note   CSN: [id number] Arrival date & time: 03/10/22  1621     History  Chief Complaint  Patient presents with   Abdominal Pain   Back Pain    Ann Moyer is a 31 y.o. female.  The history is provided by the patient and medical records. No language interpreter was used.  Abdominal Pain Back Pain Associated symptoms: abdominal pain      31 year old female significant history of DM1, borderline personality disorder, CKD, asthma, opioid use disorder, marijuana use, who presents with complaints of abdominal pain.  Patient reports she developed acute onset of diffuse abdominal discomfort that started earlier today.  Described as a cramping sensation, persistent, with associated nausea, nonbloody nonbilious vomiting, and some loose stools.  She does not endorse any fever but endorses chills.  Denies chest pain shortness of breath productive cough or dysuria.  States her symptoms feel similar to prior abdominal pain that she has had in the past.  She is unsure what triggers her symptoms.  She does not monitor her blood sugar on a regular basis but states she does take her insulins.  She denies any recent sick contact.  She admits to occasional marijuana use last use several days prior.  Denies alcohol use.  Denies any recent sickness.  Home Medications Prior to Admission medications   Medication Sig Start Date End Date Taking? Authorizing Provider  albuterol (VENTOLIN HFA) 108 (90 Base) MCG/ACT inhaler Inhale 2 puffs into the lungs every 4 (four) hours as needed for wheezing or shortness of breath. 01/26/13   [provider]  amphetamine-dextroamphetamine (ADDERALL) 20 MG tablet Take 20 mg by mouth daily. 06/10/20   [provider]  buprenorphine-naloxone (SUBOXONE) 8-2 mg SUBL SL tablet Place 0.5 tablets under the tongue in the morning, at noon, in the evening, and at bedtime. 07/10/20   [provider]  dicyclomine (BENTYL)  20 MG tablet Take 1 tablet (20 mg total) by mouth 2 (two) times daily. 12/16/20   Carroll Sage, PA-C  glucagon 1 MG injection Inject 1 mg into the vein once as needed (blood sugar). 08/12/10   [provider]  insulin aspart (NOVOLOG) 100 UNIT/ML FlexPen Inject 2-5 Units into the skin 2 (two) times daily before a meal. Sliding scale 04/03/20   [provider]  insulin glargine (LANTUS) 100 UNIT/ML Solostar Pen Inject 16 Units into the skin at bedtime. 02/08/20   [provider]  OLANZapine (ZYPREXA) 5 MG tablet Take 5 mg by mouth daily. 07/10/20 12/19/20  [provider]  omeprazole (PRILOSEC) 20 MG capsule Take 1 capsule (20 mg total) by mouth 2 (two) times daily before a meal. 12/20/20 01/19/21  Shahmehdi, Gemma Payor, MD  promethazine (PHENERGAN) 12.5 MG suppository Place 1 suppository (12.5 mg total) rectally every 6 (six) hours as needed for up to 12 doses for nausea or vomiting. 12/20/20   Kendell Bane, MD  sertraline (ZOLOFT) 50 MG tablet Take 1 tablet by mouth daily. 07/28/19 12/19/20  [provider]  SUMAtriptan Succinate Refill 6 MG/0.5ML SOCT Inject 6 mg into the skin as needed (migraine). 09/27/19 12/19/20  [provider]      Allergies    Amitriptyline, Gabapentin, Pregabalin, Tramadol, Haloperidol, Toradol [ketorolac tromethamine], Ketorolac, and Metoclopramide    Review of Systems   Review of Systems  Gastrointestinal:  Positive for abdominal pain.  Musculoskeletal:  Positive for back pain.  All other systems reviewed and are negative.   Physical  Exam Updated Vital Signs BP (!) 178/94 (BP Location: Right Arm)   Pulse 74   Temp 98.1 F (36.7 C) (Oral)   Resp 18   Ht 5\' 5"  (1.651 m)   Wt 52.2 kg   SpO2 98%   BMI 19.14 kg/m  Physical Exam Vitals and nursing note reviewed.  Constitutional:      Appearance: She is well-developed. She is diaphoretic.     Comments: Patient is leaning forward, actively vomiting appears  uncomfortable.  HENT:     Head: Atraumatic.  Eyes:     Conjunctiva/sclera: Conjunctivae normal.  Cardiovascular:     Rate and Rhythm: Tachycardia present.  Pulmonary:     Effort: Pulmonary effort is normal.  Abdominal:     General: Bowel sounds are normal.     Tenderness: There is abdominal tenderness (Diffuse tenderness without guarding or rebound tenderness.).  Musculoskeletal:     Cervical back: Neck supple.  Skin:    Findings: No rash.  Neurological:     Mental Status: She is alert.  Psychiatric:        Mood and Affect: Mood normal.     ED Results / Procedures / Treatments   Labs (all labs ordered are listed, but only abnormal results are displayed) Labs Reviewed  COMPREHENSIVE METABOLIC PANEL - Abnormal; Notable for the following components:      Result Value   CO2 20 (*)    Glucose, Bld 276 (*)    BUN 24 (*)    Creatinine, Ser 1.77 (*)    Albumin 3.3 (*)    Alkaline Phosphatase 255 (*)    GFR, Estimated 39 (*)    All other components within normal limits  CBC - Abnormal; Notable for the following components:   WBC 11.2 (*)    All other components within normal limits  URINALYSIS, ROUTINE W REFLEX MICROSCOPIC - Abnormal; Notable for the following components:   Glucose, UA 150 (*)    Hgb urine dipstick SMALL (*)    Protein, ur >=300 (*)    Bacteria, UA RARE (*)    All other components within normal limits  RAPID URINE DRUG SCREEN, HOSP PERFORMED - Abnormal; Notable for the following components:   Tetrahydrocannabinol POSITIVE (*)    All other components within normal limits  CBG MONITORING, ED - Abnormal; Notable for the following components:   Glucose-Capillary 289 (*)    All other components within normal limits  LIPASE, BLOOD  PREGNANCY, URINE    EKG EKG Interpretation  Date/Time:  Tuesday March 10 2022 17:28:31 EST Ventricular Rate:  93 PR Interval:  134 QRS Duration: 78 QT Interval:  354 QTC Calculation: 441 R Axis:   75 Text  Interpretation: Sinus rhythm Right atrial enlargement Confirmed by Eber HongMiller, Brian ([id number]) on 03/10/2022 5:37:59 PM  Radiology No results found.  Procedures Procedures    Medications Ordered in ED Medications  ondansetron (ZOFRAN) injection 4 mg (4 mg Intravenous Given 03/10/22 1739)  sodium chloride 0.9 % bolus 1,000 mL (0 mLs Intravenous Stopped 03/10/22 2029)  droperidol (INAPSINE) 2.5 MG/ML injection 2.5 mg (2.5 mg Intravenous Given 03/10/22 1806)  LORazepam (ATIVAN) injection 1 mg (1 mg Intravenous Given 03/10/22 1750)  sodium chloride 0.9 % bolus 1,000 mL (0 mLs Intravenous Stopped 03/10/22 1938)  ketorolac (TORADOL) 15 MG/ML injection 15 mg (15 mg Intravenous Given 03/10/22 1936)  diphenhydrAMINE (BENADRYL) injection 50 mg (50 mg Intravenous Given 03/10/22 1936)    ED Course/ Medical Decision Making/ A&P  Medical Decision Making Amount and/or Complexity of Data Reviewed Labs: ordered. ECG/medicine tests: ordered.  Risk Prescription drug management.   BP (!) 178/94 (BP Location: Right Arm)   Pulse 74   Temp 98.1 F (36.7 C) (Oral)   Resp 18   Ht 5\' 5"  (1.651 m)   Wt 52.2 kg   SpO2 98%   BMI 19.14 kg/m   81:1 PM 31 year old female significant history of DM1, borderline personality disorder, CKD, asthma, opioid use disorder, marijuana use, who presents with complaints of abdominal pain.  Patient reports she developed acute onset of diffuse abdominal discomfort that started earlier today.  Described as a cramping sensation, persistent, with associated nausea, nonbloody nonbilious vomiting, and some loose stools.  She does not endorse any fever but endorses chills.  Denies chest pain shortness of breath productive cough or dysuria.  States her symptoms feel similar to prior abdominal pain that she has had in the past.  She is unsure what triggers her symptoms.  She does not monitor her blood sugar on a regular basis but states she does take her insulins.   She denies any recent sick contact.  She admits to occasional marijuana use last use several days prior.  Denies alcohol use.  Denies any recent sickness.  On exam, patient is leaning forward, actively vomiting and appears uncomfortable.  Skin is clammy to the touch, heart is tachycardic, lungs are clear to auscultation abdomen is diffusely tender without focal point tenderness.  Vitals are reviewed and remarkable for elevated blood pressure 178/94.  Patient is afebrile and no hypoxia.  Initial concern is cannabinol hyperemesis syndrome versus gastroparesis secondary to diabetes.  She does not have any infectious symptoms at this time.  7:01 PM On reassessment after patient received droperidol, Ativan, IV fluid, and Zofran, she still appears uncomfortable and continues to endorse abdominal discomfort with nausea.  She has noted to have elevated blood pressure with a systolic blood pressure of 214 likely in the setting of discomfort.  Her workup today is remarkable for the presence of THC use in her urine drug screen.  She is also is dehydrated.  She is hyperglycemic without evidence of DKA.  Will continue to provide more supportive care.  -Labs ordered, independently viewed and interpreted by me.  Labs remarkable for Cr. 1.77 IVF given. CBG is 276 with normal anion gap and no ketone in the UA. Doubt DKA. WBC 11.2 -The patient was maintained on a cardiac monitor.  I personally viewed and interpreted the cardiac monitored which showed an underlying rhythm of: NSR -This patient presents to the ED for concern of abd pain, this involves an extensive number of treatment options, and is a complaint that carries with it a high risk of complications and morbidity.  The differential diagnosis includes cannabinoid hyperemesis syndrome, gastroparesis, gastritis, viral GI, pancreatitis, colitis -Co morbidities that complicate the patient evaluation includes DM, marijuana use, CKD, polysubstance use -Treatment includes  droperidol, IVF, antiemetic, pain medication -Reevaluation of the patient after these medicines showed that the patient stayed the same -PCP office notes or outside notes reviewed -Discussion with specialist [facility]. 34 -Escalation to admission/observation considered: patients agreeable with admission for sxs control -Social Determinant of Health considered which includes polysubstance use, recommend cessation  8:01 PM Despite receiving several treatment modalities patient reported no improvement of symptoms.  Still endorses nausea, also endorsed sensation of restlessness after receiving medication given.  I gave patient Benadryl as her symptoms may be due to akathisia.  Will consult medicine for admission for further management of her intractable nausea and vomiting.  8:33 PM Appreciate consultation from [facility] Dr. Thomes Dinning who agrees to admit patient for intractable nausea and vomiting in the setting of cannabinoid hyperemesis syndrome.        Final Clinical Impression(s) / ED Diagnoses Final diagnoses:  Intractable cyclical vomiting with nausea  Hyperglycemia  Hypertensive urgency  Dehydration    Rx / DC Orders ED Discharge Orders     None         Fayrene Helper, PA-C 03/10/22 2034    Eber Hong, MD 03/10/22 2224

## 2022-03-10 NOTE — H&P (Incomplete)
History and Physical    Patient: Ann Moyer AYT:[id number] DOB: 15-Apr-1990 DOA: 03/10/2022 DOS: the patient was seen and examined on 03/11/2022 [facility]  Patient coming from: Home  Chief Complaint:  Chief Complaint  Patient presents with   Abdominal Pain   Back Pain   HPI: Ann Moyer is a 31 y.o. female with medical history significant of T1DM, hypertension, anxiety, depression, CKD 3B and opiate dependence who presents to the emergency department due to abdominal pain.  Patient states that they have not been started earlier today, pain was crampy in nature and it was persistent.  It was associated with nausea and nonbloody, nonbilious vomiting and some loose bowel movement.  She denies chest pain, shortness of breath fever.  Patient states that symptoms are similar to last admission (9/14 to 12/20/2020).  Patient states that she has been compliant with her insulin.  She endorsed use of marijuana with last use being few days ago.  ED Course:  In the emergency department, BP was elevated at 179/106 on arrival to the ED.  Workup in the ED showed normal CBC except for WBC of 11.2.  BMP was normal except for bicarb of 20, blood glucose 276, BUN/creatinine 24/1.77 (baseline creatinine at 1.3-1.5).  Albumin 3.3, ALP 255, urine drug screen was positive for THC, lipase 33, urinalysis was positive for proteinuria. She was treated with Ativan, Toradol, Benadryl, Zofran, Phenergan and IV hydration was provided.  Hospitalist was asked to admit patient for further evaluation and management.  Review of Systems: Review of systems as noted in the HPI. All other systems reviewed and are negative.   Past Medical History:  Diagnosis Date   Asthma    Borderline personality disorder (HCC)    CKD (chronic kidney disease)    Diabetes mellitus without complication (HCC)    Opioid use disorder    Past Surgical History:  Procedure Laterality Date   BIOPSY  07/13/2020   Procedure: BIOPSY;   Surgeon: Lanelle Bal, DO;  [facility];  Service: Endoscopy;;  gastric, esophageal   BIOPSY  11/21/2020   Procedure: BIOPSY;  Surgeon: Corbin Ade, MD;  Location: [facility];  Service: Endoscopy;;   ESOPHAGOGASTRODUODENOSCOPY (EGD) WITH PROPOFOL N/A 07/13/2020   LA Grade D erosive esophagitis without bleeding, gastritis s/p biopsy, normal duodenum. Found to have candida esophagitis at that time   ESOPHAGOGASTRODUODENOSCOPY (EGD) WITH PROPOFOL N/A 11/21/2020   Surgeon: Corbin Ade, MD;  normal-appearing esophagus, patchy gastric erosion and erythema of doubtful clinical significance s/p biopsy, normal examined duodenum.  Pathology with antral and oxyntic mucosa with mild changes of reactive gastropathy and slight chronic inflammation, negative for H. pylori.   EYE SURGERY      Social History:  reports that she has been smoking cigarettes. She has been smoking an average of .5 packs per day. She has never used smokeless tobacco. She reports that she does not currently use alcohol. She reports current drug use. Drug: Marijuana.   Allergies  Allergen Reactions   Amitriptyline Swelling and Other (See Comments)    Legs and feet    Gabapentin Swelling    swelling   Ivp Dye [Iodinated Contrast Media]     Kidney damage   Pregabalin Swelling    swelling   Tramadol Itching and Swelling    Swelling, itching Eye and face swelling    Haloperidol Other (See Comments)    Uncontrollable movements  "uncontrolled movement of my body, like tremors"; previously charted to cause  nausea and vomiting.     Toradol [Ketorolac Tromethamine] Nausea Only   Ketorolac Itching   Metoclopramide Itching    Jittery and itchy Jittery and itchy     History reviewed. No pertinent family history.   Prior to Admission medications   Medication Sig Start Date End Date Taking? Authorizing Provider  albuterol (VENTOLIN HFA) 108 (90 Base) MCG/ACT inhaler Inhale 2 puffs into the lungs every  4 (four) hours as needed for wheezing or shortness of breath. 01/26/13  Yes [provider]  amphetamine-dextroamphetamine (ADDERALL) 20 MG tablet Take 20 mg by mouth daily. 06/10/20   [provider]  buprenorphine-naloxone (SUBOXONE) 8-2 mg SUBL SL tablet Place 0.5 tablets under the tongue in the morning, at noon, in the evening, and at bedtime. 07/10/20   [provider]  dicyclomine (BENTYL) 20 MG tablet Take 1 tablet (20 mg total) by mouth 2 (two) times daily. 12/16/20   Carroll Sage, PA-C  glucagon 1 MG injection Inject 1 mg into the vein once as needed (blood sugar). 08/12/10   [provider]  insulin aspart (NOVOLOG) 100 UNIT/ML FlexPen Inject 2-5 Units into the skin 2 (two) times daily before a meal. Sliding scale 04/03/20   [provider]  insulin glargine (LANTUS) 100 UNIT/ML Solostar Pen Inject 16 Units into the skin at bedtime. 02/08/20   [provider]  OLANZapine (ZYPREXA) 5 MG tablet Take 5 mg by mouth daily. 07/10/20 12/19/20  [provider]  omeprazole (PRILOSEC) 20 MG capsule Take 1 capsule (20 mg total) by mouth 2 (two) times daily before a meal. 12/20/20 01/19/21  Shahmehdi, Gemma Payor, MD  promethazine (PHENERGAN) 12.5 MG suppository Place 1 suppository (12.5 mg total) rectally every 6 (six) hours as needed for up to 12 doses for nausea or vomiting. 12/20/20   Kendell Bane, MD  sertraline (ZOLOFT) 50 MG tablet Take 1 tablet by mouth daily. 07/28/19 12/19/20  [provider]  SUMAtriptan Succinate Refill 6 MG/0.5ML SOCT Inject 6 mg into the skin as needed (migraine). 09/27/19 12/19/20  [provider]    Physical Exam: BP (!) 187/96   Pulse 89   Temp 98.3 F (36.8 C) (Oral)   Resp 14   Ht 5\' 5"  (1.651 m)   Wt 52.2 kg   SpO2 98%   BMI 19.14 kg/m   General: 31 y.o. year-old female well developed well nourished in no acute distress.  Alert and oriented x3. HEENT: NCAT, EOMI Neck: Supple,  trachea medial Cardiovascular: Regular rate and rhythm with no rubs or gallops.  No thyromegaly or JVD noted.  No lower extremity edema. 2/4 pulses in all 4 extremities. Respiratory: Clear to auscultation with no wheezes or rales. Good inspiratory effort. Abdomen: Soft, tender to palpation of abdomen.  Nondistended with normal bowel sounds x4 quadrants. Muskuloskeletal: No cyanosis, clubbing or edema noted bilaterally Neuro: CN II-XII intact, strength 5/5 x 4, sensation, reflexes intact Skin: No ulcerative lesions noted or rashes Psychiatry: Judgement and insight appear normal. Mood is appropriate for condition and setting          Labs on Admission:  Basic Metabolic Panel: Recent Labs  Lab 03/10/22 1700  NA 135  K 5.0  CL 105  CO2 20*  GLUCOSE 276*  BUN 24*  CREATININE 1.77*  CALCIUM 9.2   Liver Function Tests: Recent Labs  Lab 03/10/22 1700  AST 35  ALT 35  ALKPHOS 255*  BILITOT 0.4  PROT 7.1  ALBUMIN 3.3*  Recent Labs  Lab 03/10/22 1700  LIPASE 33   No results for input(s): "AMMONIA" in the last 168 hours. CBC: Recent Labs  Lab 03/10/22 1700  WBC 11.2*  HGB 12.7  HCT 37.9  MCV 85.0  PLT 277   Cardiac Enzymes: No results for input(s): "CKTOTAL", "CKMB", "CKMBINDEX", "TROPONINI" in the last 168 hours.  BNP (last 3 results) No results for input(s): "BNP" in the last 8760 hours.  ProBNP (last 3 results) No results for input(s): "PROBNP" in the last 8760 hours.  CBG: Recent Labs  Lab 03/10/22 1646  GLUCAP 289*    Radiological Exams on Admission: No results found.  EKG: I independently viewed the EKG done and my findings are as followed: Sinus tachycardia at a rate of 111 bpm  Assessment/Plan Present on Admission:  Intractable abdominal pain  Intractable nausea and vomiting  Acute kidney injury superimposed on CKD (HCC)  Type 1 diabetes mellitus not at goal [facility])  Opioid use with opioid-induced disorder (HCC)  Depression with  anxiety  Principal Problem:   Intractable abdominal pain Active Problems:   Acute kidney injury superimposed on CKD (HCC)   Opioid use with opioid-induced disorder (HCC)   Type 1 diabetes mellitus not at goal [facility])   Intractable nausea and vomiting   Depression with anxiety   Hypertensive urgency   Elevated alpha fetoprotein  Intractable nausea, vomiting and abdominal pain Of note, gastroparesis was already ruled out due to normal gastroesophageal study at [facility] per medical record Continue IV NS at 75 mL/h Continue Suboxone Continue IV Zofran as needed Consider GI consult for worsening of symptoms  Hypertensive urgency Abdominal pain may be a contributing factor Continue IV labetalol 5 mg every 6 hours as needed for SBP > 180 and transition to p.o. once patient is able to tolerate oral intake   T1DM with uncontrolled hyperglycemia Continue Semglee 8 units nightly and adjust dose accordingly Continue ISS and hypoglycemia protocol  Acute kidney injury on CKD 3B BUN/creatinine 24/1.77 (baseline creatinine at 1.3-1.5)  Marijuana use Last THC was few days ago Patient counseled on marijuana cessation  Opioid use with opioid-induced disorder Continue home Suboxone  Elevated ALP ALP 255, possibly related to patient's current symptoms Continue to monitor ALP  Depression Continue Zoloft   DVT prophylaxis: Lovenox  Code Status: Full code  Family Communication: None at bedside  Consults: None  Severity of Illness: The appropriate patient status for this patient is INPATIENT. Inpatient status is judged to be reasonable and necessary in order to provide the required intensity of service to ensure the patient's safety. The patient's presenting symptoms, physical exam findings, and initial radiographic and laboratory data in the context of their chronic comorbidities is felt to place them at high risk for further clinical deterioration. Furthermore, it is not anticipated that the  patient will be medically stable for discharge from the hospital within 2 midnights of admission.   * I certify that at the point of admission it is my clinical judgment that the patient will require inpatient hospital care spanning beyond 2 midnights from the point of admission due to high intensity of service, high risk for further deterioration and high frequency of surveillance required.*  Author: Frankey Shown, DO 03/11/2022 4:08 AM  For on call review [email or web address].

## 2022-03-10 NOTE — ED Notes (Signed)
CBG 289 in triage. 

## 2022-03-10 NOTE — ED Triage Notes (Signed)
Pt c/o all over abd pain and lower back pain started today. N/v/d. Denies black or bloody stool or vomit. Pt anxious in triage. Moaning Color wnl.

## 2022-03-11 DIAGNOSIS — I160 Hypertensive urgency: Secondary | ICD-10-CM | POA: Insufficient documentation

## 2022-03-11 DIAGNOSIS — R109 Unspecified abdominal pain: Secondary | ICD-10-CM | POA: Diagnosis not present

## 2022-03-11 DIAGNOSIS — R772 Abnormality of alphafetoprotein: Secondary | ICD-10-CM | POA: Insufficient documentation

## 2022-03-11 LAB — HEMOGLOBIN A1C
Hgb A1c MFr Bld: 10.4 % — ABNORMAL HIGH (ref 4.8–5.6)
Mean Plasma Glucose: 252 mg/dL

## 2022-03-11 LAB — COMPREHENSIVE METABOLIC PANEL
ALT: 31 U/L (ref 0–44)
AST: 28 U/L (ref 15–41)
Albumin: 3.4 g/dL — ABNORMAL LOW (ref 3.5–5.0)
Alkaline Phosphatase: 240 U/L — ABNORMAL HIGH (ref 38–126)
Anion gap: 13 (ref 5–15)
BUN: 30 mg/dL — ABNORMAL HIGH (ref 6–20)
CO2: 19 mmol/L — ABNORMAL LOW (ref 22–32)
Calcium: 9 mg/dL (ref 8.9–10.3)
Chloride: 104 mmol/L (ref 98–111)
Creatinine, Ser: 1.99 mg/dL — ABNORMAL HIGH (ref 0.44–1.00)
GFR, Estimated: 34 mL/min — ABNORMAL LOW (ref >60)
Glucose, Bld: 434 mg/dL — ABNORMAL HIGH (ref 70–99)
Potassium: 4.2 mmol/L (ref 3.5–5.1)
Sodium: 136 mmol/L (ref 135–145)
Total Bilirubin: 0.7 mg/dL (ref 0.3–1.2)
Total Protein: 7.2 g/dL (ref 6.5–8.1)

## 2022-03-11 LAB — CBC
HCT: 37 % (ref 36.0–46.0)
Hemoglobin: 12.4 g/dL (ref 12.0–15.0)
MCH: 28.6 pg (ref 26.0–34.0)
MCHC: 33.5 g/dL (ref 30.0–36.0)
MCV: 85.5 fL (ref 80.0–100.0)
Platelets: 251 10*3/uL (ref 150–400)
RBC: 4.33 MIL/uL (ref 3.87–5.11)
RDW: 12.6 % (ref 11.5–15.5)
WBC: 12.2 10*3/uL — ABNORMAL HIGH (ref 4.0–10.5)
nRBC: 0 % (ref 0.0–0.2)

## 2022-03-11 LAB — MAGNESIUM: Magnesium: 2.1 mg/dL (ref 1.7–2.4)

## 2022-03-11 LAB — HIV ANTIBODY (ROUTINE TESTING W REFLEX): HIV Screen 4th Generation wRfx: NONREACTIVE

## 2022-03-11 LAB — CBG MONITORING, ED
Glucose-Capillary: 371 mg/dL — ABNORMAL HIGH (ref 70–99)
Glucose-Capillary: 338 mg/dL — ABNORMAL HIGH (ref 70–99)
Glucose-Capillary: 105 mg/dL — ABNORMAL HIGH (ref 70–99)
Glucose-Capillary: 153 mg/dL — ABNORMAL HIGH (ref 70–99)

## 2022-03-11 LAB — PHOSPHORUS: Phosphorus: 4.1 mg/dL (ref 2.5–4.6)

## 2022-03-11 LAB — GLUCOSE, CAPILLARY: Glucose-Capillary: 187 mg/dL — ABNORMAL HIGH (ref 70–99)

## 2022-03-11 MED ORDER — SODIUM CHLORIDE 0.9 % IV SOLN: INTRAVENOUS | Status: AC

## 2022-03-11 MED ORDER — SUMATRIPTAN SUCCINATE 6 MG/0.5ML ~~LOC~~ SOLN: 6.0000 mg | SUBCUTANEOUS | Status: DC | PRN

## 2022-03-11 MED ORDER — PROMETHAZINE HCL 12.5 MG RE SUPP: 12.5000 mg | Freq: Four times a day (QID) | RECTAL | 1 refills | Status: AC | PRN

## 2022-03-11 MED ORDER — ALPRAZOLAM 0.5 MG PO TABS
0.5000 mg | ORAL_TABLET | Freq: Three times a day (TID) | ORAL | Status: DC | PRN
  Administered 2022-03-11 – 2022-03-12 (×4): 0.5 mg via ORAL
  Filled 2022-03-11 (×4): qty 1

## 2022-03-11 MED ORDER — INSULIN GLARGINE-YFGN 100 UNIT/ML ~~LOC~~ SOLN
10.0000 [IU] | Freq: Every day | SUBCUTANEOUS | Status: DC
  Administered 2022-03-11: 10 [IU] via SUBCUTANEOUS
  Filled 2022-03-11 (×2): qty 0.1

## 2022-03-11 MED ORDER — ALBUTEROL SULFATE HFA 108 (90 BASE) MCG/ACT IN AERS: 2.0000 | INHALATION_SPRAY | RESPIRATORY_TRACT | Status: DC | PRN

## 2022-03-11 MED ORDER — DIPHENHYDRAMINE HCL 25 MG PO CAPS
50.0000 mg | ORAL_CAPSULE | Freq: Four times a day (QID) | ORAL | Status: DC | PRN
  Administered 2022-03-11: 50 mg via ORAL
  Filled 2022-03-11: qty 2

## 2022-03-11 MED ORDER — SERTRALINE HCL 50 MG PO TABS
50.0000 mg | ORAL_TABLET | Freq: Every day | ORAL | Status: DC
  Administered 2022-03-11 – 2022-03-12 (×2): 50 mg via ORAL
  Filled 2022-03-11 (×2): qty 1

## 2022-03-11 MED ORDER — INSULIN GLARGINE-YFGN 100 UNIT/ML ~~LOC~~ SOLN: 8.0000 [IU] | Freq: Every day | SUBCUTANEOUS | Status: DC

## 2022-03-11 MED ORDER — INSULIN ASPART 100 UNIT/ML FLEXPEN: 2.0000 [IU] | PEN_INJECTOR | Freq: Two times a day (BID) | SUBCUTANEOUS | 3 refills | Status: AC

## 2022-03-11 MED ORDER — ALBUTEROL SULFATE (2.5 MG/3ML) 0.083% IN NEBU: 2.5000 mg | INHALATION_SOLUTION | RESPIRATORY_TRACT | Status: DC | PRN

## 2022-03-11 MED ORDER — OLANZAPINE 5 MG PO TABS
5.0000 mg | ORAL_TABLET | Freq: Every day | ORAL | Status: DC
  Administered 2022-03-11 – 2022-03-12 (×2): 5 mg via ORAL
  Filled 2022-03-11 (×2): qty 1

## 2022-03-11 MED ORDER — ENOXAPARIN SODIUM 40 MG/0.4ML IJ SOSY
40.0000 mg | PREFILLED_SYRINGE | INTRAMUSCULAR | Status: DC
  Filled 2022-03-11: qty 0.4

## 2022-03-11 MED ORDER — PANTOPRAZOLE SODIUM 40 MG PO TBEC
40.0000 mg | DELAYED_RELEASE_TABLET | Freq: Every day | ORAL | Status: DC
  Administered 2022-03-11 – 2022-03-12 (×2): 40 mg via ORAL
  Filled 2022-03-11 (×2): qty 1

## 2022-03-11 MED ORDER — ONDANSETRON HCL 4 MG PO TABS
4.0000 mg | ORAL_TABLET | Freq: Four times a day (QID) | ORAL | Status: DC | PRN
  Administered 2022-03-11: 4 mg via ORAL
  Filled 2022-03-11: qty 1

## 2022-03-11 MED ORDER — ONDANSETRON HCL 4 MG/2ML IJ SOLN
4.0000 mg | Freq: Four times a day (QID) | INTRAMUSCULAR | Status: DC | PRN
  Administered 2022-03-11 (×3): 4 mg via INTRAVENOUS
  Filled 2022-03-11 (×3): qty 2

## 2022-03-11 MED ORDER — INSULIN GLARGINE 100 UNIT/ML SOLOSTAR PEN: 16.0000 [IU] | PEN_INJECTOR | Freq: Every day | SUBCUTANEOUS | 5 refills | Status: AC

## 2022-03-11 MED ORDER — BUPRENORPHINE HCL-NALOXONE HCL 8-2 MG SL SUBL
0.5000 | SUBLINGUAL_TABLET | Freq: Every day | SUBLINGUAL | Status: DC
  Administered 2022-03-11 – 2022-03-12 (×2): 0.5 via SUBLINGUAL
  Filled 2022-03-11 (×3): qty 1

## 2022-03-11 MED ORDER — LABETALOL HCL 5 MG/ML IV SOLN
5.0000 mg | Freq: Four times a day (QID) | INTRAVENOUS | Status: DC | PRN
  Administered 2022-03-11 (×2): 5 mg via INTRAVENOUS
  Filled 2022-03-11 (×2): qty 4

## 2022-03-11 MED ORDER — ACETAMINOPHEN 650 MG RE SUPP: 650.0000 mg | Freq: Four times a day (QID) | RECTAL | Status: DC | PRN

## 2022-03-11 MED ORDER — ONDANSETRON HCL 4 MG PO TABS: 4.0000 mg | ORAL_TABLET | Freq: Four times a day (QID) | ORAL | 0 refills | Status: AC | PRN

## 2022-03-11 MED ORDER — ACETAMINOPHEN 325 MG PO TABS
650.0000 mg | ORAL_TABLET | Freq: Four times a day (QID) | ORAL | Status: DC | PRN
  Administered 2022-03-11: 650 mg via ORAL
  Filled 2022-03-11: qty 2

## 2022-03-11 MED ORDER — HYDROMORPHONE HCL 1 MG/ML IJ SOLN
1.0000 mg | INTRAMUSCULAR | Status: DC | PRN
  Administered 2022-03-11 – 2022-03-12 (×3): 1 mg via INTRAVENOUS
  Filled 2022-03-11 (×3): qty 1

## 2022-03-11 MED ORDER — INSULIN ASPART 100 UNIT/ML IJ SOLN
20.0000 [IU] | Freq: Once | INTRAMUSCULAR | Status: AC
  Administered 2022-03-11: 20 [IU] via SUBCUTANEOUS
  Filled 2022-03-11: qty 1

## 2022-03-11 MED ORDER — ALPRAZOLAM 0.5 MG PO TABS: 0.5000 mg | ORAL_TABLET | Freq: Three times a day (TID) | ORAL | 0 refills | Status: AC | PRN

## 2022-03-11 MED ORDER — INSULIN ASPART 100 UNIT/ML IJ SOLN: 0.0000 [IU] | Freq: Every day | INTRAMUSCULAR | Status: DC

## 2022-03-11 MED ORDER — INSULIN ASPART 100 UNIT/ML IJ SOLN
0.0000 [IU] | Freq: Three times a day (TID) | INTRAMUSCULAR | Status: DC
  Administered 2022-03-11: 7 [IU] via SUBCUTANEOUS
  Administered 2022-03-11 – 2022-03-12 (×2): 2 [IU] via SUBCUTANEOUS
  Filled 2022-03-11 (×2): qty 1

## 2022-03-11 NOTE — ED Notes (Signed)
Pt ambulated to restroom, came to nurses station asking for pain medication. Dr. Thomes Dinning made aware

## 2022-03-11 NOTE — ED Notes (Signed)
Pt stating she is too sick to leave   Dr Jola Schmidt aware

## 2022-03-11 NOTE — ED Notes (Signed)
Pt walking around ED dept with IV fuids, pt asks when she can get more pain meds-informed pt she could have another dose at 2030. Pt verbalized understanding.

## 2022-03-11 NOTE — ED Notes (Signed)
Pt states she needs something stronger than what she has received for pain, RN asked pt what she normally takes for pain at home, pt states "I never take anything at home for pain, but when I'm in the hospital they give me morphine and dilaudid"  Dr. Thomes Dinning made aware. No new orders at this time

## 2022-03-11 NOTE — Hospital Course (Addendum)
Ann Moyer is a 31 y.o. female with medical history significant of T1DM, hypertension, anxiety, depression, CKD 3B and opiate dependence who presents to the emergency department due to abdominal pain.  Patient states that they have not been started earlier today, pain was crampy in nature and it was persistent.  It was associated with nausea and nonbloody, nonbilious vomiting and some loose bowel movement.  She denies chest pain, shortness of breath fever.  Patient states that symptoms are similar to last admission (9/14 to 12/20/2020).  Patient states that she has been compliant with her insulin.  She endorsed use of marijuana with last use being few days ago.   ED Course:  , BP was elevated at 179/106 on arrival to the ED.  Workup in the ED showed normal CBC except for WBC of 11.2.  BMP was normal except for bicarb of 20, blood glucose 276, BUN/creatinine 24/1.77 (baseline creatinine at 1.3-1.5).  Albumin 3.3, ALP 255, urine drug screen was positive for THC, lipase 33, urinalysis was positive for proteinuria. She was treated with Ativan, Toradol, Benadryl, Zofran, Phenergan and IV hydration was provided.  Hospitalist was asked to admit patient for further evaluation and management.  Assessment/Plan  Principal Problem:   Intractable abdominal pain Active Problems:   Acute kidney injury superimposed on CKD (HCC)   Opioid use with opioid-induced disorder (HCC)   Type 1 diabetes mellitus not at goal [facility])   Intractable nausea and vomiting   Depression with anxiety   Hypertensive urgency   Elevated alpha fetoprotein   Intractable nausea, vomiting and abdominal pain -  Of note, gastroparesis was already ruled out due to normal gastroesophageal study at [facility] per medical record Continue IV NS at 75 mL/h Continue Suboxone Continue IV Zofran as needed Consider GI consult for worsening of symptoms   Hypertensive urgency Abdominal pain may be a contributing factor Continue IV labetalol 5 mg every  6 hours as needed for SBP > 180 and transition to p.o. once patient is able to tolerate oral intake   T1DM with uncontrolled hyperglycemia Continue Semglee 8 units nightly and adjust dose accordingly Continue ISS and hypoglycemia protocol   Acute kidney injury on CKD 3B BUN/creatinine 24/1.77 (baseline creatinine at 1.3-1.5) Lab Results  Component Value Date   CREATININE 1.99 (H) 03/11/2022   CREATININE 1.77 (H) 03/10/2022   CREATININE 1.34 (H) 12/20/2020       Marijuana use Last THC was few days ago Patient counseled on marijuana cessation   Opioid use with opioid-induced disorder Continue home Suboxone   Elevated ALP ALP 255, possibly related to patient's current symptoms Continue to monitor ALP   Depression Continue Zoloft

## 2022-03-11 NOTE — ED Notes (Addendum)
Pt has been walking around nursing station. Pt states the benadryl has not helped any. No SCDs applied due to pt constantly walking stating it helps relieve pressure to abd.

## 2022-03-11 NOTE — TOC Progression Note (Signed)
   Transition of Care [facility]) Screening Note   Patient Details  Name: Ann Moyer Date of Birth: 1990-06-27   Transition of Care [facility]) CM/SW Contact:    Leitha Bleak, RN Phone Number: 03/11/2022, 3:34 PM    Transition of Care Department [facility]) has reviewed patient and no TOC needs have been identified at this time. We will continue to monitor patient advancement through interdisciplinary progression rounds. If new patient transition needs arise, please place a TOC consult.   Expected Discharge Plan and Services        Expected Discharge Date: 03/11/22

## 2022-03-11 NOTE — ED Notes (Addendum)
Dr Sherryll Burger aware of pt repeatedly asking for pain meds. Did ask about a ct scan. No orders at this time  Pt currently ambulating around nursing station. Requesting benadryl "because it helps me relax".

## 2022-03-11 NOTE — Discharge Summary (Signed)
Physician Discharge Summary   Patient: Ann Moyer MRN: [id number] DOB: 1990/04/27  Admit date:     03/10/2022  Discharge date: 03/11/22  Discharge Physician: Kendell Bane   [facility]   Recommendations at discharge:    -Follow-up with a PCP within 1 -4 weeks -Continue taking your Suboxone, avoid any street drugs including marijuana -Monitor your blood sugars closely continue current insulin regimens - Strict diabetic diet  Discharge Diagnoses: Principal Problem:   Intractable abdominal pain Active Problems:   Acute kidney injury superimposed on CKD (HCC)   Opioid use with opioid-induced disorder (HCC)   Type 1 diabetes mellitus not at goal [facility])   Intractable nausea and vomiting   Depression with anxiety   Hypertensive urgency   Elevated alpha fetoprotein  Resolved Problems:   * No resolved hospital problems. *  Hospital Course: Ann Moyer is a 31 y.o. female with medical history significant of T1DM, hypertension, anxiety, depression, CKD 3B and opiate dependence who presents to the emergency department due to abdominal pain.  Patient states that they have not been started earlier today, pain was crampy in nature and it was persistent.  It was associated with nausea and nonbloody, nonbilious vomiting and some loose bowel movement.  She denies chest pain, shortness of breath fever.  Patient states that symptoms are similar to last admission (9/14 to 12/20/2020).  Patient states that she has been compliant with her insulin.  She endorsed use of marijuana with last use being few days ago.   ED Course:  , BP was elevated at 179/106 on arrival to the ED.  Workup in the ED showed normal CBC except for WBC of 11.2.  BMP was normal except for bicarb of 20, blood glucose 276, BUN/creatinine 24/1.77 (baseline creatinine at 1.3-1.5).  Albumin 3.3, ALP 255, urine drug screen was positive for THC, lipase 33, urinalysis was positive for proteinuria. She was treated with Ativan,  Toradol, Benadryl, Zofran, Phenergan and IV hydration was provided.  Hospitalist was asked to admit patient for further evaluation and management.  Assessment/Plan  Principal Problem:   Intractable abdominal pain Active Problems:   Acute kidney injury superimposed on CKD (HCC)   Opioid use with opioid-induced disorder (HCC)   Type 1 diabetes mellitus not at goal [facility])   Intractable nausea and vomiting   Depression with anxiety   Hypertensive urgency   Elevated alpha fetoprotein   Intractable nausea, vomiting and abdominal pain -Improved nausea vomiting, tolerating clear liquid diet Of note, gastroparesis was already ruled out due to normal gastroesophageal study at [facility] per medical record -Status post IV fluid resuscitation Continue Suboxone Continue IV Zofran as needed>>> switch to sublingual, p.o.    Hypertensive urgency Abdominal pain may be a contributing factor Continue IV labetalol 5 mg every 6 hours as needed for SBP > 180 and transition to p.o. once patient is able to tolerate oral intake  -Blood pressures improved, stabilized   T1DM with uncontrolled hyperglycemia Continue ISS and hypoglycemia protocol  -Resuming home regimen   Acute kidney injury on CKD 3B BUN/creatinine 24/1.77 (baseline creatinine at 1.3-1.5) Lab Results  Component Value Date   CREATININE 1.99 (H) 03/11/2022   CREATININE 1.77 (H) 03/10/2022   CREATININE 1.34 (H) 12/20/2020    Marijuana use Last THC was few days ago Patient counseled on marijuana cessation   Opioid use with opioid-induced disorder Continue home Suboxone   Elevated ALP ALP 255, possibly related to patient's current symptoms    Depression Continue Zoloft  Disposition: Home Diet recommendation:  Discharge Diet Orders (From admission, onward)     Start     Ordered   03/11/22 0000  Diet - low sodium heart healthy        03/11/22 1238           Carb modified diet DISCHARGE MEDICATION: Allergies as of  03/11/2022       Reactions   Amitriptyline Swelling, Other (See Comments)   Legs and feet   Gabapentin Swelling   swelling   Ivp Dye [iodinated Contrast Media]    Kidney damage   Pregabalin Swelling   swelling   Tramadol Itching, Swelling   Swelling, itching Eye and face swelling   Haloperidol Other (See Comments)   Uncontrollable movements  "uncontrolled movement of my body, like tremors"; previously charted to cause nausea and vomiting.     Toradol [ketorolac Tromethamine] Nausea Only   Ketorolac Itching   Metoclopramide Itching   Jittery and itchy Jittery and itchy        Medication List     STOP taking these medications    dicyclomine 20 MG tablet Commonly known as: BENTYL   glucagon 1 MG injection       TAKE these medications    albuterol 108 (90 Base) MCG/ACT inhaler Commonly known as: VENTOLIN HFA Inhale 2 puffs into the lungs every 4 (four) hours as needed for wheezing or shortness of breath.   ALPRAZolam 0.5 MG tablet Commonly known as: XANAX Take 1 tablet (0.5 mg total) by mouth 3 (three) times daily as needed for up to 5 days for anxiety.   buprenorphine-naloxone 8-2 mg Subl SL tablet Commonly known as: SUBOXONE Place 0.5 tablets under the tongue in the morning, at noon, in the evening, and at bedtime.   insulin aspart 100 UNIT/ML FlexPen Commonly known as: NOVOLOG Inject 2-5 Units into the skin 2 (two) times daily before a meal. Sliding scale   insulin glargine 100 UNIT/ML Solostar Pen Commonly known as: LANTUS Inject 16 Units into the skin at bedtime.   OLANZapine 5 MG tablet Commonly known as: ZYPREXA Take 5 mg by mouth daily.   omeprazole 20 MG capsule Commonly known as: PRILOSEC Take 1 capsule (20 mg total) by mouth 2 (two) times daily before a meal.   ondansetron 4 MG tablet Commonly known as: ZOFRAN Take 1 tablet (4 mg total) by mouth every 6 (six) hours as needed for nausea.   promethazine 12.5 MG suppository Commonly known  as: PHENERGAN Place 1 suppository (12.5 mg total) rectally every 6 (six) hours as needed for up to 24 doses for nausea or vomiting.   sertraline 50 MG tablet Commonly known as: ZOLOFT Take 1 tablet by mouth daily.   SUMAtriptan Succinate Refill 6 MG/0.5ML Soct Inject 6 mg into the skin as needed (migraine).        Discharge Exam: Filed Weights   03/10/22 1645  Weight: 52.2 kg      Physical Exam:   General:  AAO x 3,  cooperative, no distress;   HEENT:  Normocephalic, PERRL, otherwise with in Normal limits   Neuro:  CNII-XII intact. , normal motor and sensation, reflexes intact   Lungs:   Clear to auscultation BL, Respirations unlabored,  No wheezes / crackles  Cardio:    S1/S2, RRR, No murmure, No Rubs or Gallops   Abdomen:  Soft, non-tender, bowel sounds active all four quadrants, no guarding or peritoneal signs.  Muscular  skeletal:  Limited exam -global generalized weaknesses -  in bed, able to move all 4 extremities,   2+ pulses,  symmetric, No pitting edema  Skin:  Dry, warm to touch, negative for any Rashes,  Wounds: Please see nursing documentation          Condition at discharge: fair  The results of significant diagnostics from this hospitalization (including imaging, microbiology, ancillary and laboratory) are listed below for reference.   Imaging Studies: No results found.  Microbiology: Results for orders placed or performed during the hospital encounter of 12/18/20  Resp Panel by RT-PCR (Flu A&B, Covid) Nasopharyngeal Swab     Status: None   Collection Time: 12/18/20  2:03 AM   Specimen: Nasopharyngeal Swab; Nasopharyngeal(NP) swabs in vial transport medium  Result Value Ref Range Status   SARS Coronavirus 2 by RT PCR NEGATIVE NEGATIVE Final    Comment: (NOTE) SARS-CoV-2 target nucleic acids are NOT DETECTED.  The SARS-CoV-2 RNA is generally detectable in upper respiratory specimens during the acute phase of infection. The  lowest concentration of SARS-CoV-2 viral copies this assay can detect is 138 copies/mL. A negative result does not preclude SARS-Cov-2 infection and should not be used as the sole basis for treatment or other patient management decisions. A negative result may occur with  improper specimen collection/handling, submission of specimen other than nasopharyngeal swab, presence of viral mutation(s) within the areas targeted by this assay, and inadequate number of viral copies(<138 copies/mL). A negative result must be combined with clinical observations, patient history, and epidemiological information. The expected result is Negative.  Fact Sheet for Patients:  BloggerCourse.com  Fact Sheet for Healthcare Providers:  SeriousBroker.it  This test is no t yet approved or cleared by the [place] FDA and  has been authorized for detection and/or diagnosis of SARS-CoV-2 by FDA under an Emergency Use Authorization (EUA). This EUA will remain  in effect (meaning this test can be used) for the duration of the COVID-19 declaration under Section 564(b)(1) of the Act, 21 U.S.C.section 360bbb-3(b)(1), unless the authorization is terminated  or revoked sooner.       Influenza A by PCR NEGATIVE NEGATIVE Final   Influenza B by PCR NEGATIVE NEGATIVE Final    Comment: (NOTE) The Xpert Xpress SARS-CoV-2/FLU/RSV plus assay is intended as an aid in the diagnosis of influenza from Nasopharyngeal swab specimens and should not be used as a sole basis for treatment. Nasal washings and aspirates are unacceptable for Xpert Xpress SARS-CoV-2/FLU/RSV testing.  Fact Sheet for Patients: BloggerCourse.com  Fact Sheet for Healthcare Providers: SeriousBroker.it  This test is not yet approved or cleared by the [place] FDA and has been authorized for detection and/or diagnosis of SARS-CoV-2 by FDA under  an Emergency Use Authorization (EUA). This EUA will remain in effect (meaning this test can be used) for the duration of the COVID-19 declaration under Section 564(b)(1) of the Act, 21 U.S.C. section 360bbb-3(b)(1), unless the authorization is terminated or revoked.  Performed at [facility], [address] [place], [place] [address]     Labs: CBC: Recent Labs  Lab 03/10/22 1700 03/11/22 0552  WBC 11.2* 12.2*  HGB 12.7 12.4  HCT 37.9 37.0  MCV 85.0 85.5  PLT 277 251   Basic Metabolic Panel: Recent Labs  Lab 03/10/22 1700 03/11/22 0552  NA 135 136  K 5.0 4.2  CL 105 104  CO2 20* 19*  GLUCOSE 276* 434*  BUN 24* 30*  CREATININE 1.77* 1.99*  CALCIUM 9.2 9.0  MG  --  2.1  PHOS  --  4.1   Liver Function Tests: Recent Labs  Lab 03/10/22 1700 03/11/22 0552  AST 35 28  ALT 35 31  ALKPHOS 255* 240*  BILITOT 0.4 0.7  PROT 7.1 7.2  ALBUMIN 3.3* 3.4*   CBG: Recent Labs  Lab 03/10/22 1646 03/11/22 0029 03/11/22 0919 03/11/22 1151  GLUCAP 289* 371* 338* 105*    Discharge time spent: greater than 30 minutes.  Signed: Kendell BaneSeyed A Torien Ramroop, MD [facility] 03/11/2022

## 2022-03-12 DIAGNOSIS — R109 Unspecified abdominal pain: Secondary | ICD-10-CM | POA: Diagnosis not present

## 2022-03-12 LAB — BASIC METABOLIC PANEL
Anion gap: 7 (ref 5–15)
BUN: 38 mg/dL — ABNORMAL HIGH (ref 6–20)
CO2: 23 mmol/L (ref 22–32)
Calcium: 7.9 mg/dL — ABNORMAL LOW (ref 8.9–10.3)
Chloride: 107 mmol/L (ref 98–111)
Creatinine, Ser: 2.08 mg/dL — ABNORMAL HIGH (ref 0.44–1.00)
GFR, Estimated: 32 mL/min — ABNORMAL LOW (ref >60)
Glucose, Bld: 179 mg/dL — ABNORMAL HIGH (ref 70–99)
Potassium: 3.6 mmol/L (ref 3.5–5.1)
Sodium: 137 mmol/L (ref 135–145)

## 2022-03-12 LAB — GLUCOSE, CAPILLARY: Glucose-Capillary: 155 mg/dL — ABNORMAL HIGH (ref 70–99)

## 2022-03-12 NOTE — Discharge Summary (Signed)
Physician Discharge Summary   Patient: Ann Moyer MRN: [id number] DOB: 1990-09-04  Admit date:     03/10/2022  Discharge date: 03/12/22  Discharge Physician: Deatra James   [facility]   The patient was seen and examined this morning, stable much improved abdominal pain, nausea and vomiting, tolerating p.o.... Stating she is ready to go home. Patient was given prescription for Xanax and Zofran Recommended to increase oral hydration, compliant with diabetic medication, diabetic diet, Continue taking her Suboxone    Recommendations at discharge:    Follow-up with a PCP within 1 -4 weeks Continue taking your Suboxone, avoid any street drugs including marijuana Monitor your blood sugars closely continue current insulin regimens Strict diabetic diet  Discharge Diagnoses: Principal Problem:   Intractable abdominal pain Active Problems:   Acute kidney injury superimposed on CKD ([place])   Opioid use with opioid-induced disorder (HCC)   Type 1 diabetes mellitus not at goal [facility])   Intractable nausea and vomiting   Depression with anxiety   Hypertensive urgency   Elevated alpha fetoprotein  Resolved Problems:   * No resolved hospital problems. *  Hospital Course: Ann Moyer is a 31 y.o. female with medical history significant of T1DM, hypertension, anxiety, depression, CKD 3B and opiate dependence who presents to the emergency department due to abdominal pain.  Patient states that they have not been started earlier today, pain was crampy in nature and it was persistent.  It was associated with nausea and nonbloody, nonbilious vomiting and some loose bowel movement.  She denies chest pain, shortness of breath fever.  Patient states that symptoms are similar to last admission (9/14 to 12/20/2020).  Patient states that she has been compliant with her insulin.  She endorsed use of marijuana with last use being few days ago.   ED Course:  , BP was elevated at 179/106 on  arrival to the ED.  Workup in the ED showed normal CBC except for WBC of 11.2.  BMP was normal except for bicarb of 20, blood glucose 276, BUN/creatinine 24/1.77 (baseline creatinine at 1.3-1.5).  Albumin 3.3, ALP 255, urine drug screen was positive for THC, lipase 33, urinalysis was positive for proteinuria. She was treated with Ativan, Toradol, Benadryl, Zofran, Phenergan and IV hydration was provided.  Hospitalist was asked to admit patient for further evaluation and management.  Assessment/Plan  Principal Problem:   Intractable abdominal pain Active Problems:   Acute kidney injury superimposed on CKD (HCC)   Opioid use with opioid-induced disorder (HCC)   Type 1 diabetes mellitus not at goal [facility])   Intractable nausea and vomiting   Depression with anxiety   Hypertensive urgency   Elevated alpha fetoprotein   Intractable nausea, vomiting and abdominal pain -Improved nausea vomiting, tolerating clear liquid diet Of note, gastroparesis was already ruled out due to normal gastroesophageal study at [facility] per medical record -Status post IV fluid resuscitation Continue Suboxone Continue IV Zofran as needed>>> switch to sublingual, p.o.    Hypertensive urgency Abdominal pain may be a contributing factor Continue IV labetalol 5 mg every 6 hours as needed for SBP > 180 and transition to p.o. once patient is able to tolerate oral intake  -Blood pressures improved, stabilized   T1DM with uncontrolled hyperglycemia Continue ISS and hypoglycemia protocol  -Resuming home regimen   Acute kidney injury on CKD 3B BUN/creatinine 24/1.77 (baseline creatinine at 1.3-1.5) Lab Results  Component Value Date   CREATININE 1.99 (H) 03/11/2022   CREATININE 1.77 (H) 03/10/2022  CREATININE 1.34 (H) 12/20/2020    Marijuana use Last THC was few days ago Patient counseled on marijuana cessation   Opioid use with opioid-induced disorder Continue home Suboxone   Elevated ALP ALP 255, possibly  related to patient's current symptoms    Depression Continue Zoloft     Disposition: Home Diet recommendation:  Discharge Diet Orders (From admission, onward)     Start     Ordered   03/11/22 0000  Diet - low sodium heart healthy        03/11/22 1238           Carb modified diet DISCHARGE MEDICATION: Allergies as of 03/12/2022       Reactions   Amitriptyline Swelling, Other (See Comments)   Legs and feet   Gabapentin Swelling   swelling   Ivp Dye [iodinated Contrast Media]    Kidney damage   Pregabalin Swelling   swelling   Tramadol Itching, Swelling   Swelling, itching Eye and face swelling   Haloperidol Other (See Comments)   Uncontrollable movements  "uncontrolled movement of my body, like tremors"; previously charted to cause nausea and vomiting.     Toradol [ketorolac Tromethamine] Nausea Only   Ketorolac Itching   Metoclopramide Itching   Jittery and itchy Jittery and itchy        Medication List     STOP taking these medications    dicyclomine 20 MG tablet Commonly known as: BENTYL   glucagon 1 MG injection       TAKE these medications    albuterol 108 (90 Base) MCG/ACT inhaler Commonly known as: VENTOLIN HFA Inhale 2 puffs into the lungs every 4 (four) hours as needed for wheezing or shortness of breath.   ALPRAZolam 0.5 MG tablet Commonly known as: XANAX Take 1 tablet (0.5 mg total) by mouth 3 (three) times daily as needed for up to 5 days for anxiety.   buprenorphine-naloxone 8-2 mg Subl SL tablet Commonly known as: SUBOXONE Place 0.5 tablets under the tongue in the morning, at noon, in the evening, and at bedtime.   insulin aspart 100 UNIT/ML FlexPen Commonly known as: NOVOLOG Inject 2-5 Units into the skin 2 (two) times daily before a meal. Sliding scale   insulin glargine 100 UNIT/ML Solostar Pen Commonly known as: LANTUS Inject 16 Units into the skin at bedtime.   OLANZapine 5 MG tablet Commonly known as: ZYPREXA Take  5 mg by mouth daily.   omeprazole 20 MG capsule Commonly known as: PRILOSEC Take 1 capsule (20 mg total) by mouth 2 (two) times daily before a meal.   ondansetron 4 MG tablet Commonly known as: ZOFRAN Take 1 tablet (4 mg total) by mouth every 6 (six) hours as needed for nausea.   promethazine 12.5 MG suppository Commonly known as: PHENERGAN Place 1 suppository (12.5 mg total) rectally every 6 (six) hours as needed for up to 24 doses for nausea or vomiting.   sertraline 50 MG tablet Commonly known as: ZOLOFT Take 1 tablet by mouth daily.   SUMAtriptan Succinate Refill 6 MG/0.5ML Soct Inject 6 mg into the skin as needed (migraine).        Discharge Exam: Filed Weights   03/10/22 1645 03/11/22 2105  Weight: 52.2 kg 52.2 kg        Physical Exam:   General:  AAO x 3,  cooperative, no distress;   HEENT:  Normocephalic, PERRL, otherwise with in Normal limits   Neuro:  CNII-XII intact. , normal motor and sensation,  reflexes intact   Lungs:   Clear to auscultation BL, Respirations unlabored,  No wheezes / crackles  Cardio:    S1/S2, RRR, No murmure, No Rubs or Gallops   Abdomen:  Soft, non-tender, bowel sounds active all four quadrants, no guarding or peritoneal signs.  Muscular  skeletal:  Limited exam -global generalized weaknesses - in bed, able to move all 4 extremities,   2+ pulses,  symmetric, No pitting edema  Skin:  Dry, warm to touch, negative for any Rashes,  Wounds: Please see nursing documentation            Condition at discharge: fair  The results of significant diagnostics from this hospitalization (including imaging, microbiology, ancillary and laboratory) are listed below for reference.   Imaging Studies: No results found.  Microbiology: Results for orders placed or performed during the hospital encounter of 12/18/20  Resp Panel by RT-PCR (Flu A&B, Covid) Nasopharyngeal Swab     Status: None   Collection Time: 12/18/20  2:03 AM   Specimen:  Nasopharyngeal Swab; Nasopharyngeal(NP) swabs in vial transport medium  Result Value Ref Range Status   SARS Coronavirus 2 by RT PCR NEGATIVE NEGATIVE Final    Comment: (NOTE) SARS-CoV-2 target nucleic acids are NOT DETECTED.  The SARS-CoV-2 RNA is generally detectable in upper respiratory specimens during the acute phase of infection. The lowest concentration of SARS-CoV-2 viral copies this assay can detect is 138 copies/mL. A negative result does not preclude SARS-Cov-2 infection and should not be used as the sole basis for treatment or other patient management decisions. A negative result may occur with  improper specimen collection/handling, submission of specimen other than nasopharyngeal swab, presence of viral mutation(s) within the areas targeted by this assay, and inadequate number of viral copies(<138 copies/mL). A negative result must be combined with clinical observations, patient history, and epidemiological information. The expected result is Negative.  Fact Sheet for Patients:  EntrepreneurPulse.com.au  Fact Sheet for Healthcare Providers:  IncredibleEmployment.be  This test is no t yet approved or cleared by the [place] FDA and  has been authorized for detection and/or diagnosis of SARS-CoV-2 by FDA under an Emergency Use Authorization (EUA). This EUA will remain  in effect (meaning this test can be used) for the duration of the COVID-19 declaration under Section 564(b)(1) of the Act, 21 U.S.C.section 360bbb-3(b)(1), unless the authorization is terminated  or revoked sooner.       Influenza A by PCR NEGATIVE NEGATIVE Final   Influenza B by PCR NEGATIVE NEGATIVE Final    Comment: (NOTE) The Xpert Xpress SARS-CoV-2/FLU/RSV plus assay is intended as an aid in the diagnosis of influenza from Nasopharyngeal swab specimens and should not be used as a sole basis for treatment. Nasal washings and aspirates are unacceptable for  Xpert Xpress SARS-CoV-2/FLU/RSV testing.  Fact Sheet for Patients: EntrepreneurPulse.com.au  Fact Sheet for Healthcare Providers: IncredibleEmployment.be  This test is not yet approved or cleared by the [place] FDA and has been authorized for detection and/or diagnosis of SARS-CoV-2 by FDA under an Emergency Use Authorization (EUA). This EUA will remain in effect (meaning this test can be used) for the duration of the COVID-19 declaration under Section 564(b)(1) of the Act, 21 U.S.C. section 360bbb-3(b)(1), unless the authorization is terminated or revoked.  Performed at [facility], [address] [place],  [address]     Labs: CBC: Recent Labs  Lab 03/10/22 1700 03/11/22 0552  WBC 11.2* 12.2*  HGB 12.7 12.4  HCT 37.9 37.0  MCV 85.0 85.5  PLT 277 [id number]   Basic Metabolic Panel: Recent Labs  Lab 03/10/22 1700 03/11/22 0552 03/12/22 0606  NA 135 136 137  K 5.0 4.2 3.6  CL 105 104 107  CO2 20* 19* 23  GLUCOSE 276* 434* 179*  BUN 24* 30* 38*  CREATININE 1.77* 1.99* 2.08*  CALCIUM 9.2 9.0 7.9*  MG  --  2.1  --   PHOS  --  4.1  --    Liver Function Tests: Recent Labs  Lab 03/10/22 1700 03/11/22 0552  AST 35 28  ALT 35 31  ALKPHOS 255* 240*  BILITOT 0.4 0.7  PROT 7.1 7.2  ALBUMIN 3.3* 3.4*   CBG: Recent Labs  Lab 03/11/22 0919 03/11/22 1151 03/11/22 1636 03/11/22 2117 03/12/22 0722  GLUCAP 338* 105* 153* 187* 155*    Discharge time spent: greater than 30 minutes.  Signed: Deatra James, MD [facility] 03/12/2022

## 2022-03-22 IMAGING — CT CT ABD-PELV W/ CM
2 of 4 series · 14 of 46 positions shown, 16 images · IV contrast (Omnipaque or Isovue)
Comparison: 07/12/2020

CLINICAL DATA: Abdominal pain with nausea and vomiting

EXAM:
CT ABDOMEN AND PELVIS WITH CONTRAST
TECHNIQUE: Multidetector CT imaging of the abdomen and pelvis was performed
using the standard protocol following bolus administration of
intravenous contrast.
CONTRAST:  80mL OMNIPAQUE IOHEXOL 300 MG/ML  SOLN

[Series 2: axial st · axial · 0.53mm/px · z∈[-466,-88]mm · 11 of 92 slices shown, 13 images]
[im 8/92  soft-tissue]
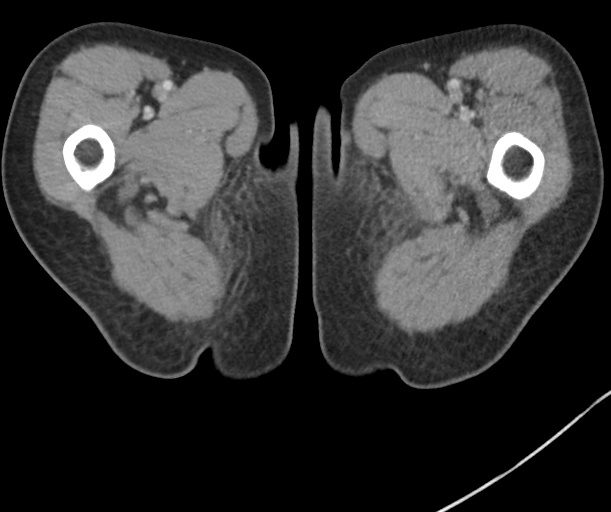
[im 8/92  bone]
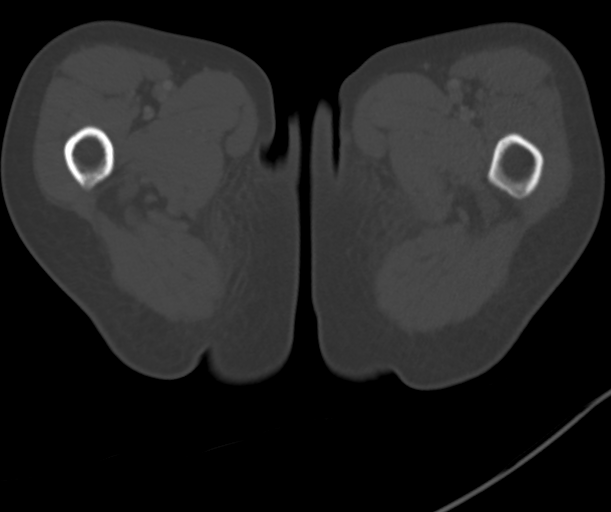
[im 16/92  soft-tissue]
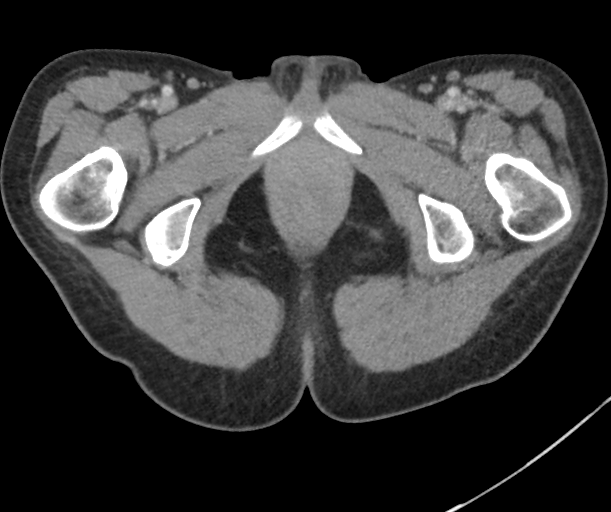
[im 23/92  soft-tissue]
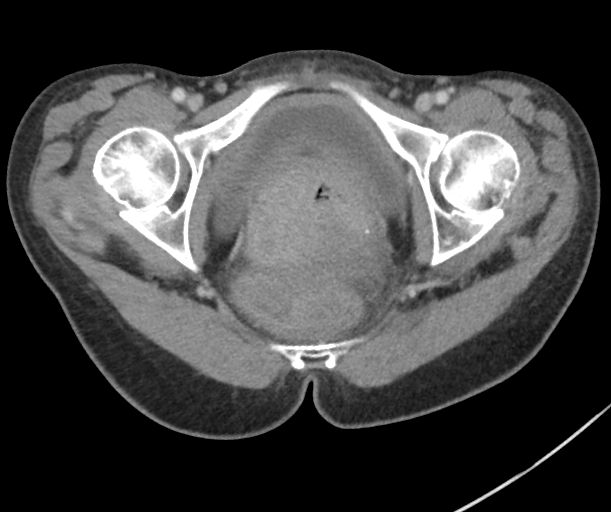
[im 31/92  soft-tissue]
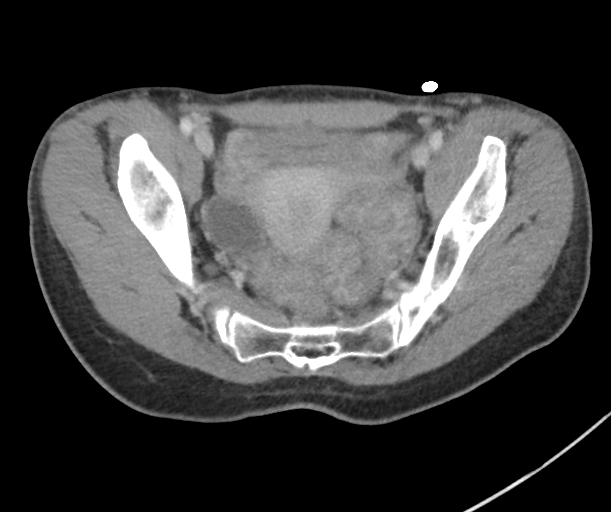
[im 38/92  soft-tissue]
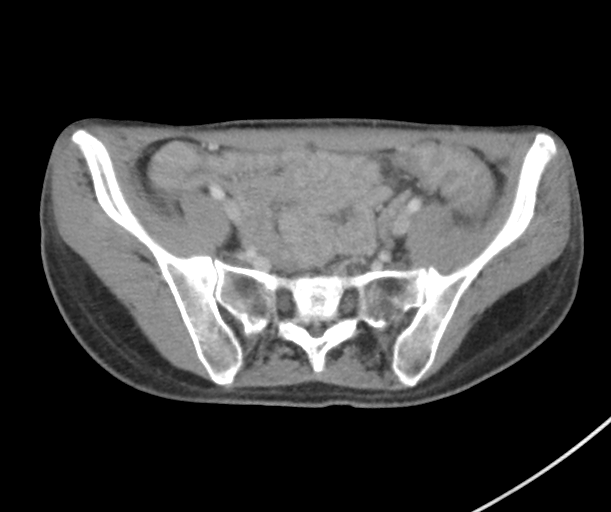
[im 46/92  soft-tissue]
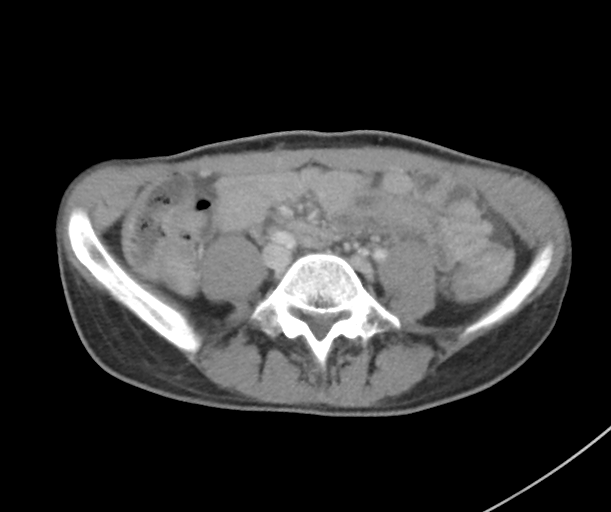
[im 54/92  soft-tissue]
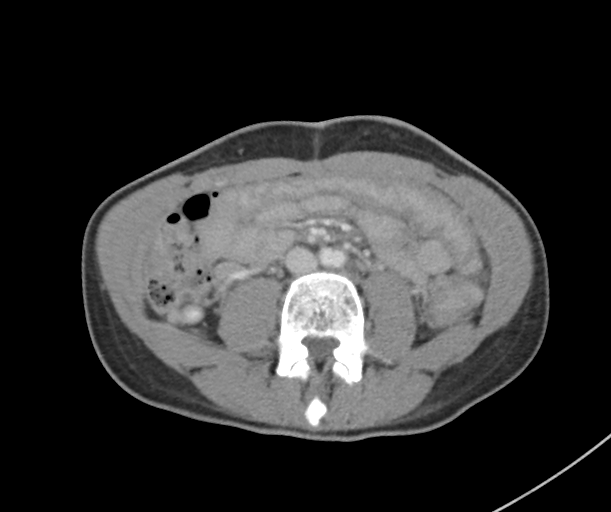
[im 61/92  soft-tissue]
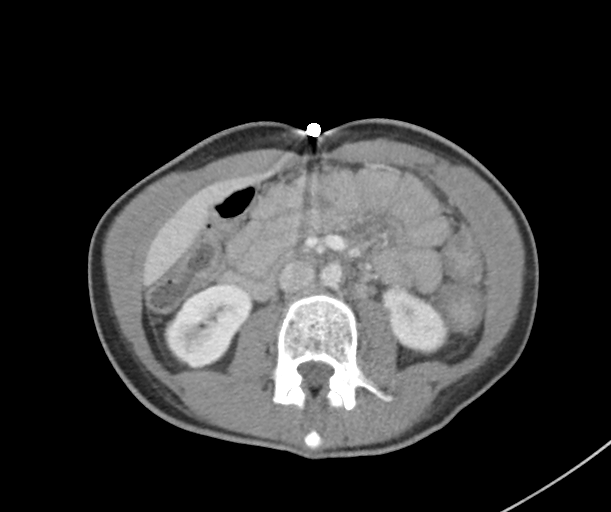
[im 69/92  soft-tissue]
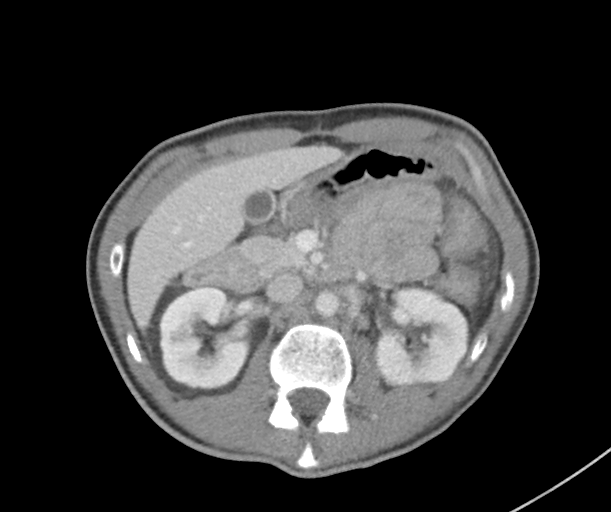
[im 69/92  bone]
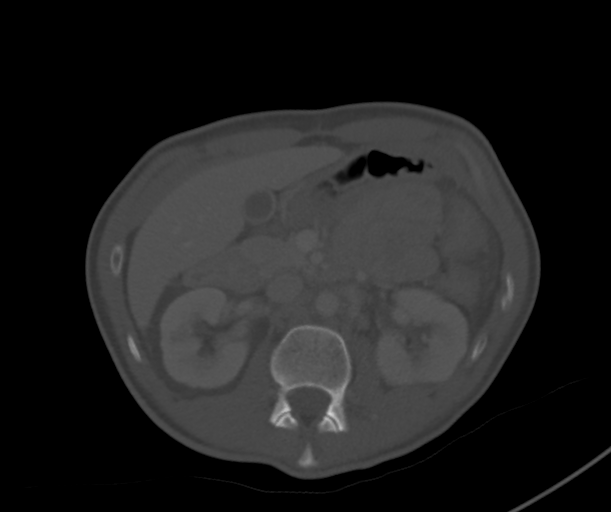
[im 76/92  soft-tissue]
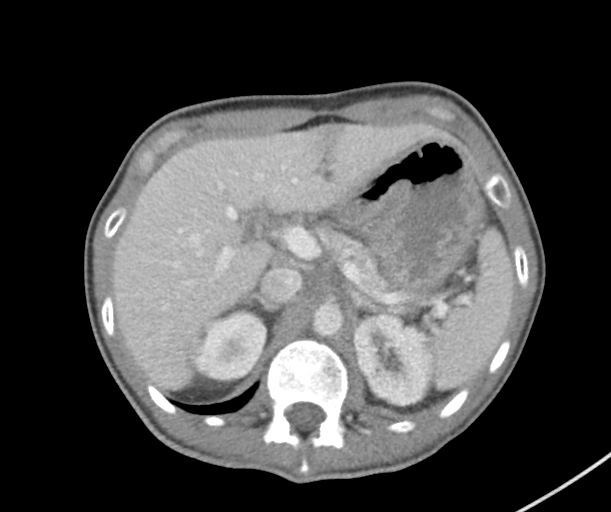
[im 84/92  soft-tissue]
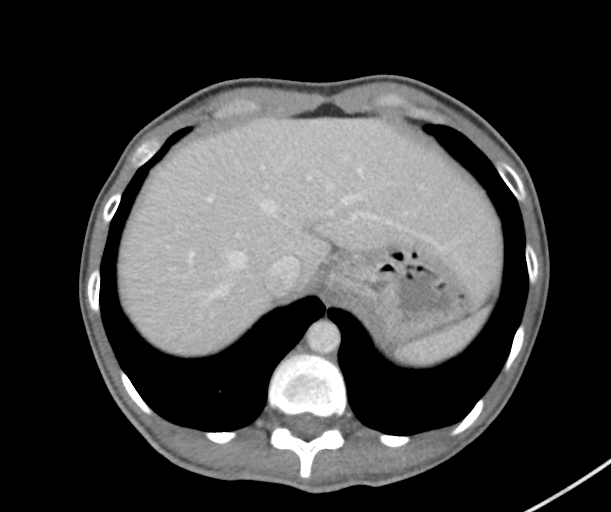

[Series 4: coronal st · coronal · 0.63mm/px · 3 of 90 slices shown]
[im 30/90  soft-tissue]
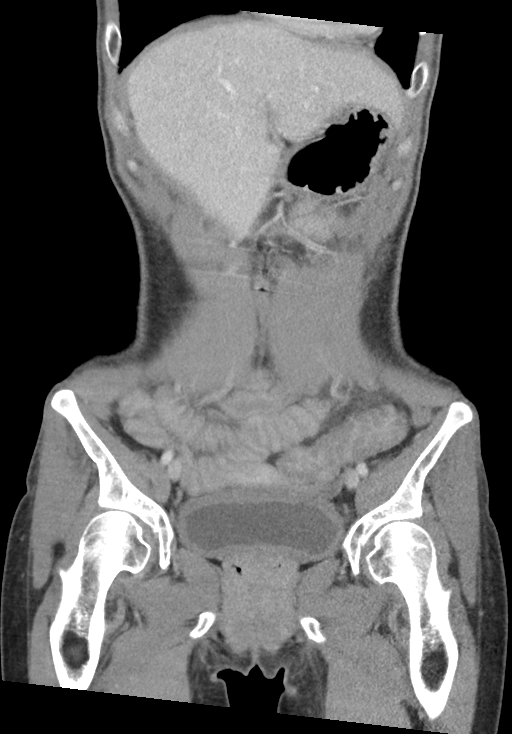
[im 40/90  soft-tissue]
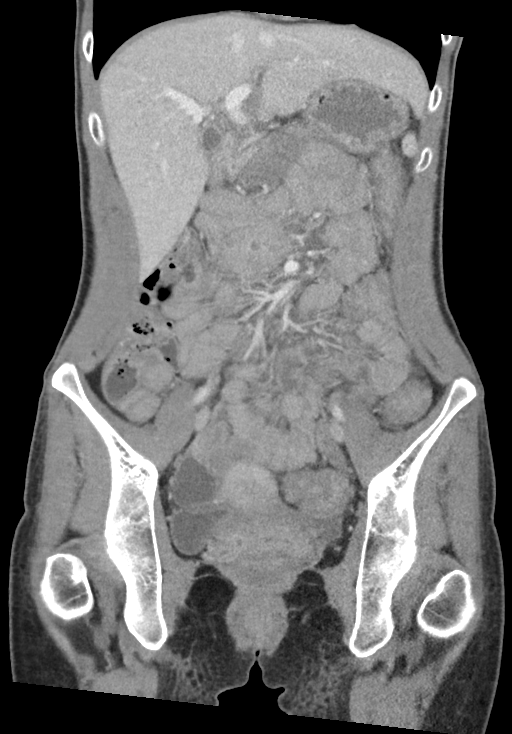
[im 50/90  soft-tissue]
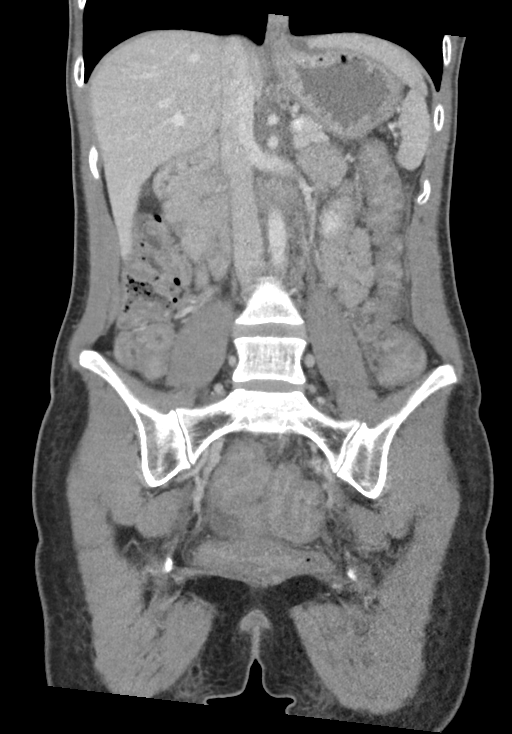

[14 of 46 positions shown; findings below may reference images not displayed]

FINDINGS: LOWER CHEST: Normal.

HEPATOBILIARY: Normal hepatic contours. No intra- or extrahepatic
biliary dilatation. The gallbladder is normal.

PANCREAS: Normal pancreas. No ductal dilatation or peripancreatic
fluid collection.

SPLEEN: Normal.

ADRENALS/URINARY TRACT: The adrenal glands are normal. No
hydronephrosis, nephroureterolithiasis or solid renal mass. The
urinary bladder is normal for degree of distention

STOMACH/BOWEL: There is no hiatal hernia. Normal duodenal course and
caliber. No small bowel dilatation or inflammation. Mild fat
stranding adjacent to the distal descending colon. Normal appendix.

VASCULAR/LYMPHATIC: Normal course and caliber of the major abdominal
vessels. No abdominal or pelvic lymphadenopathy.

REPRODUCTIVE: Normal uterus. No adnexal mass.

MUSCULOSKELETAL. No bony spinal canal stenosis or focal osseous
abnormality.

OTHER: None.
IMPRESSION: Mild fat stranding adjacent to the distal descending colon, which
may indicate mild colitis.

## 2022-03-24 IMAGING — US US RENAL
1 series · 14 of 25 positions shown · non-contrast
Comparison: No prior ultrasound, correlation is made to CT abdomen
pelvis 11/18/2020

CLINICAL DATA: Acute kidney injury

EXAM:
RENAL / URINARY TRACT ULTRASOUND COMPLETE

[Series 1: us renal · 0.20mm/px · 14 of 99 slices shown]
[im 1/99]
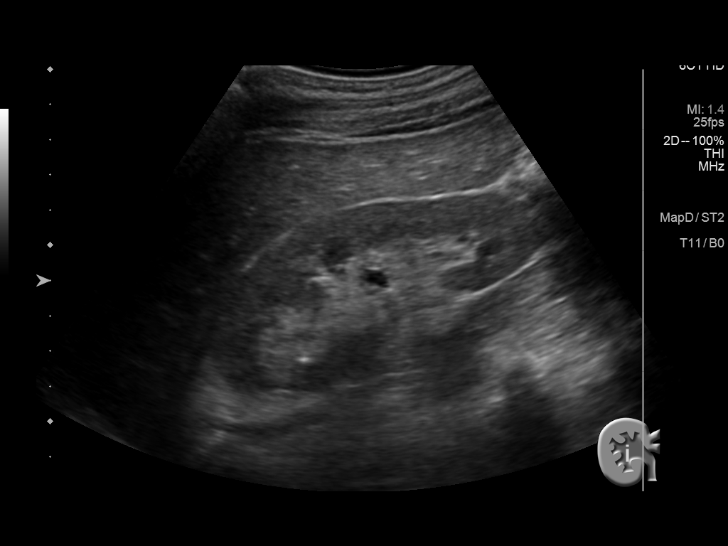
[im 9/99]
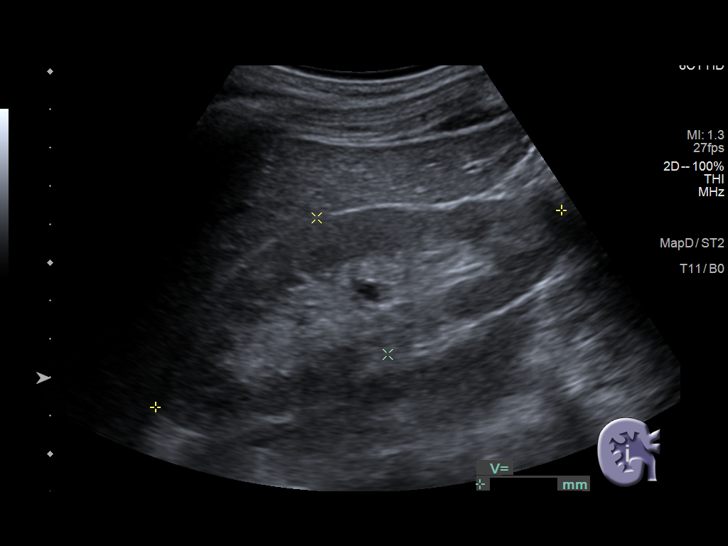
[im 17/99]
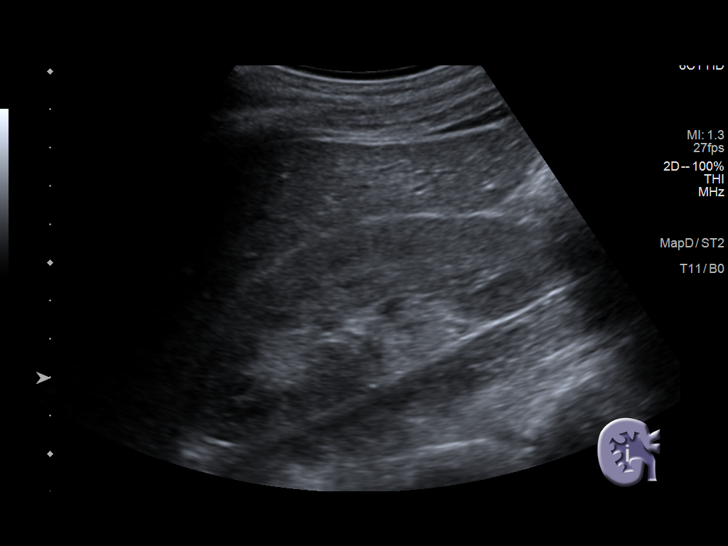
[im 25/99]
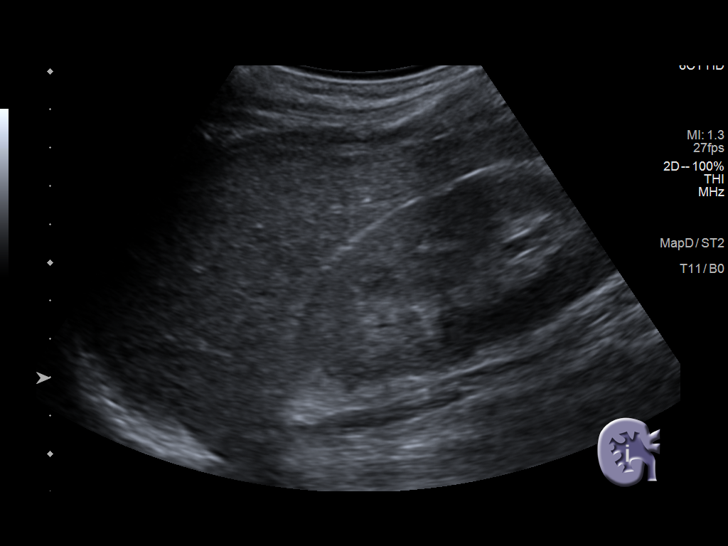
[im 33/99]
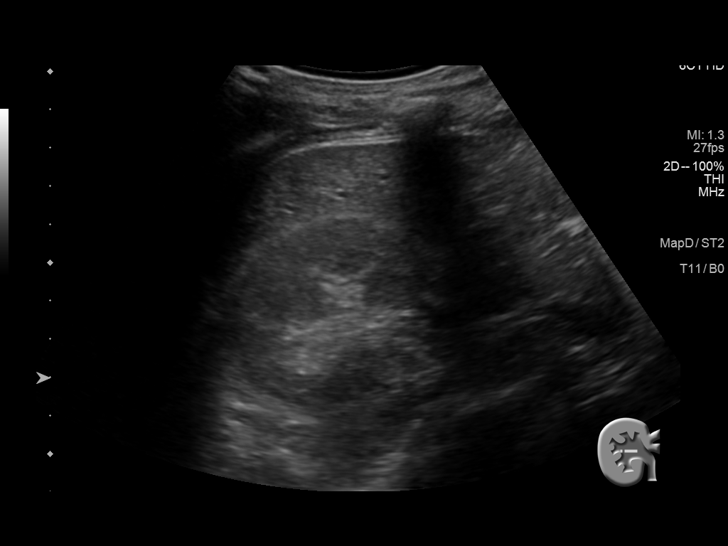
[im 37/99]
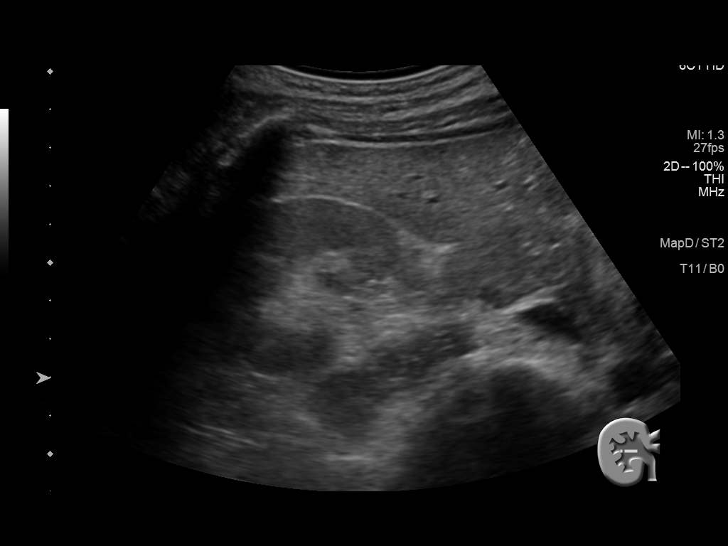
[im 45/99]
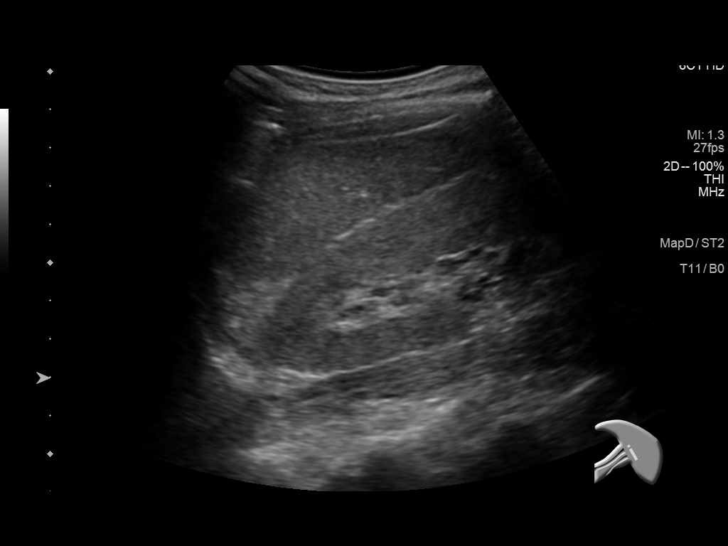
[im 54/99]
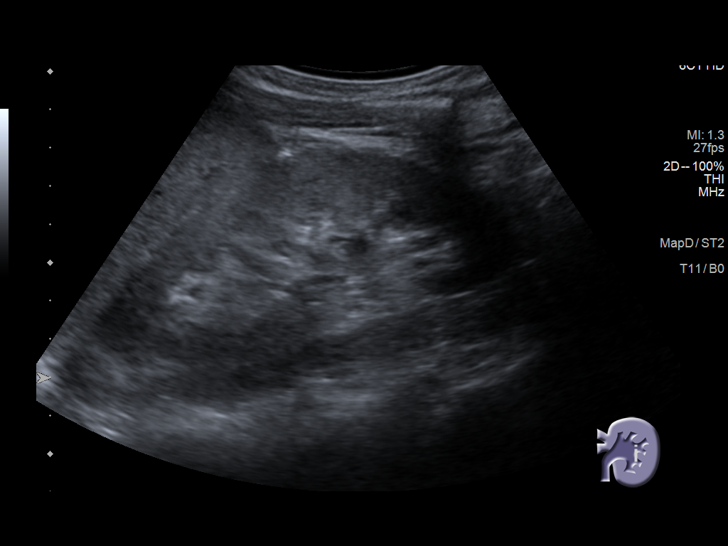
[im 62/99]
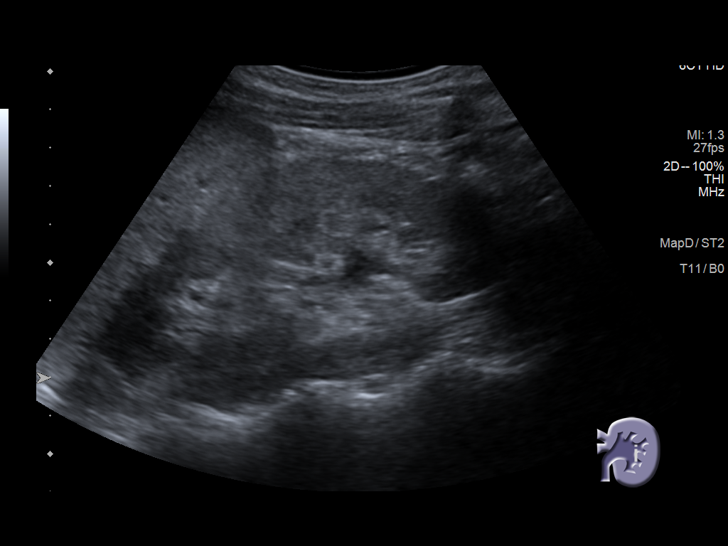
[im 66/99]
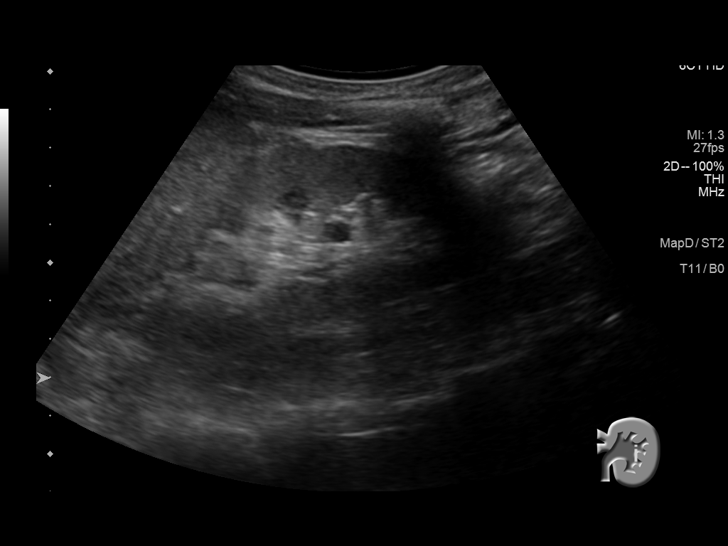
[im 74/99]
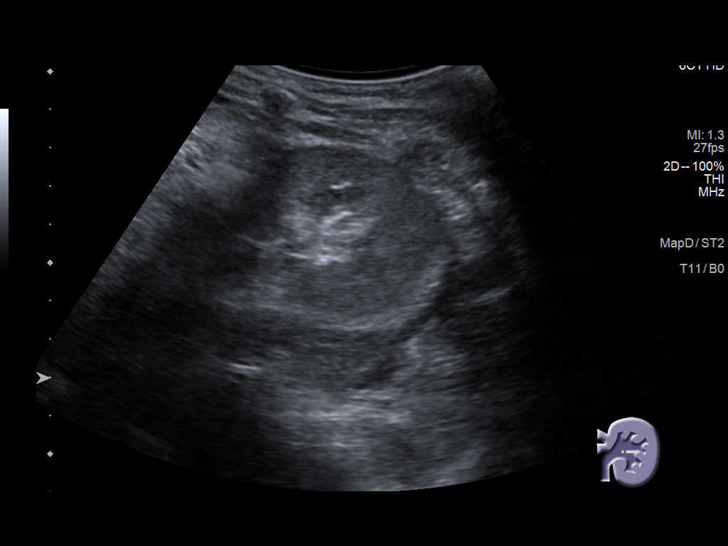
[im 82/99]
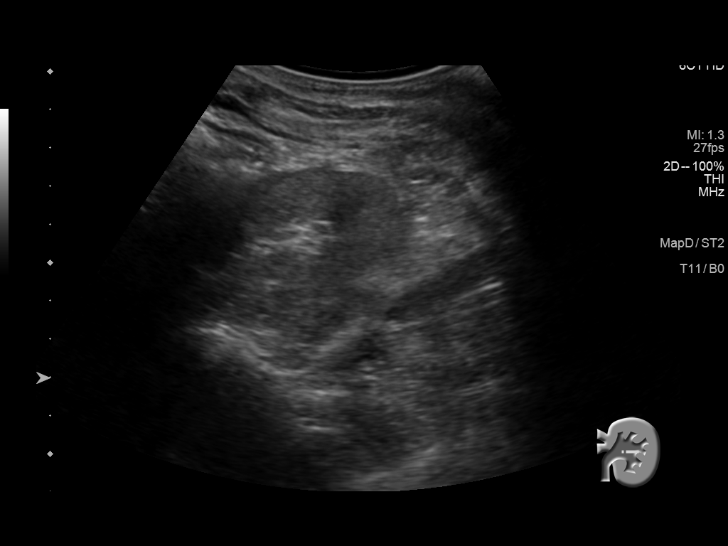
[im 90/99]
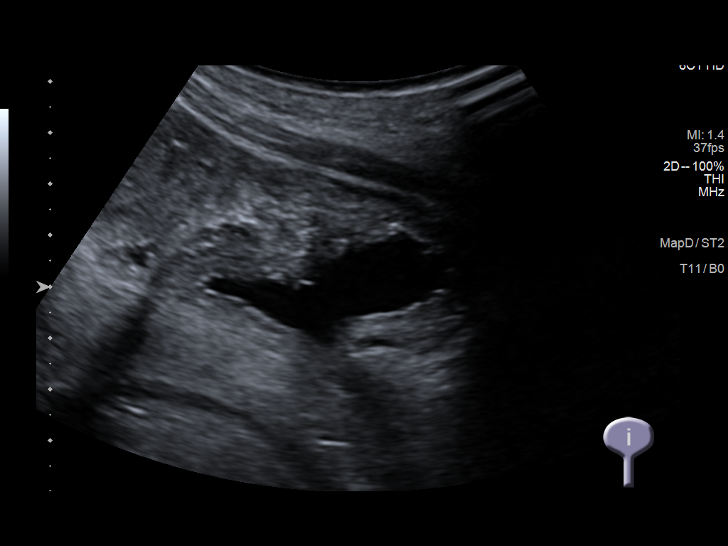
[im 99/99]
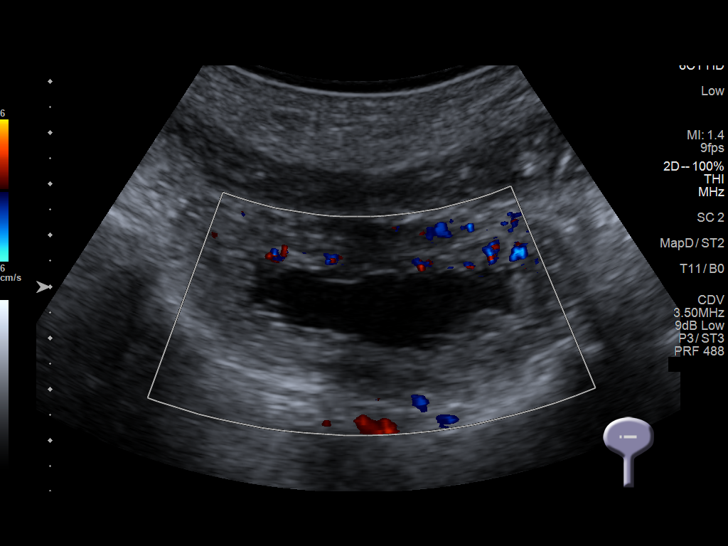

[14 of 25 positions shown; findings below may reference images not displayed]

FINDINGS: Right Kidney:

Renal measurements: 11.8 x 4.0 x 5 3 cm = volume: 132 mL.
Echogenicity within normal limits. No mass or hydronephrosis
visualized.

Left Kidney:

Renal measurements: 11.6 x 4.5 x 4.8 cm = volume: 128 mL.
Echogenicity within normal limits. No mass or hydronephrosis
visualized.

Bladder:

The bladder is not well distended, limiting evaluation, although the
wall does appear somewhat irregular, and measures to 0.8 cm.

Other:

None.
IMPRESSION: 1. Normal kidneys bilaterally.  No hydronephrosis or mass.
2. Mild bladder wall irregularity, which may be due to
underdistension of the bladder.

## 2022-04-21 IMAGING — DX DG CHEST 1V PORT
1 series · 1 of 1 positions shown · non-contrast
Comparison: None.

CLINICAL DATA: Cough

EXAM:
PORTABLE CHEST 1 VIEW

[chest ap]
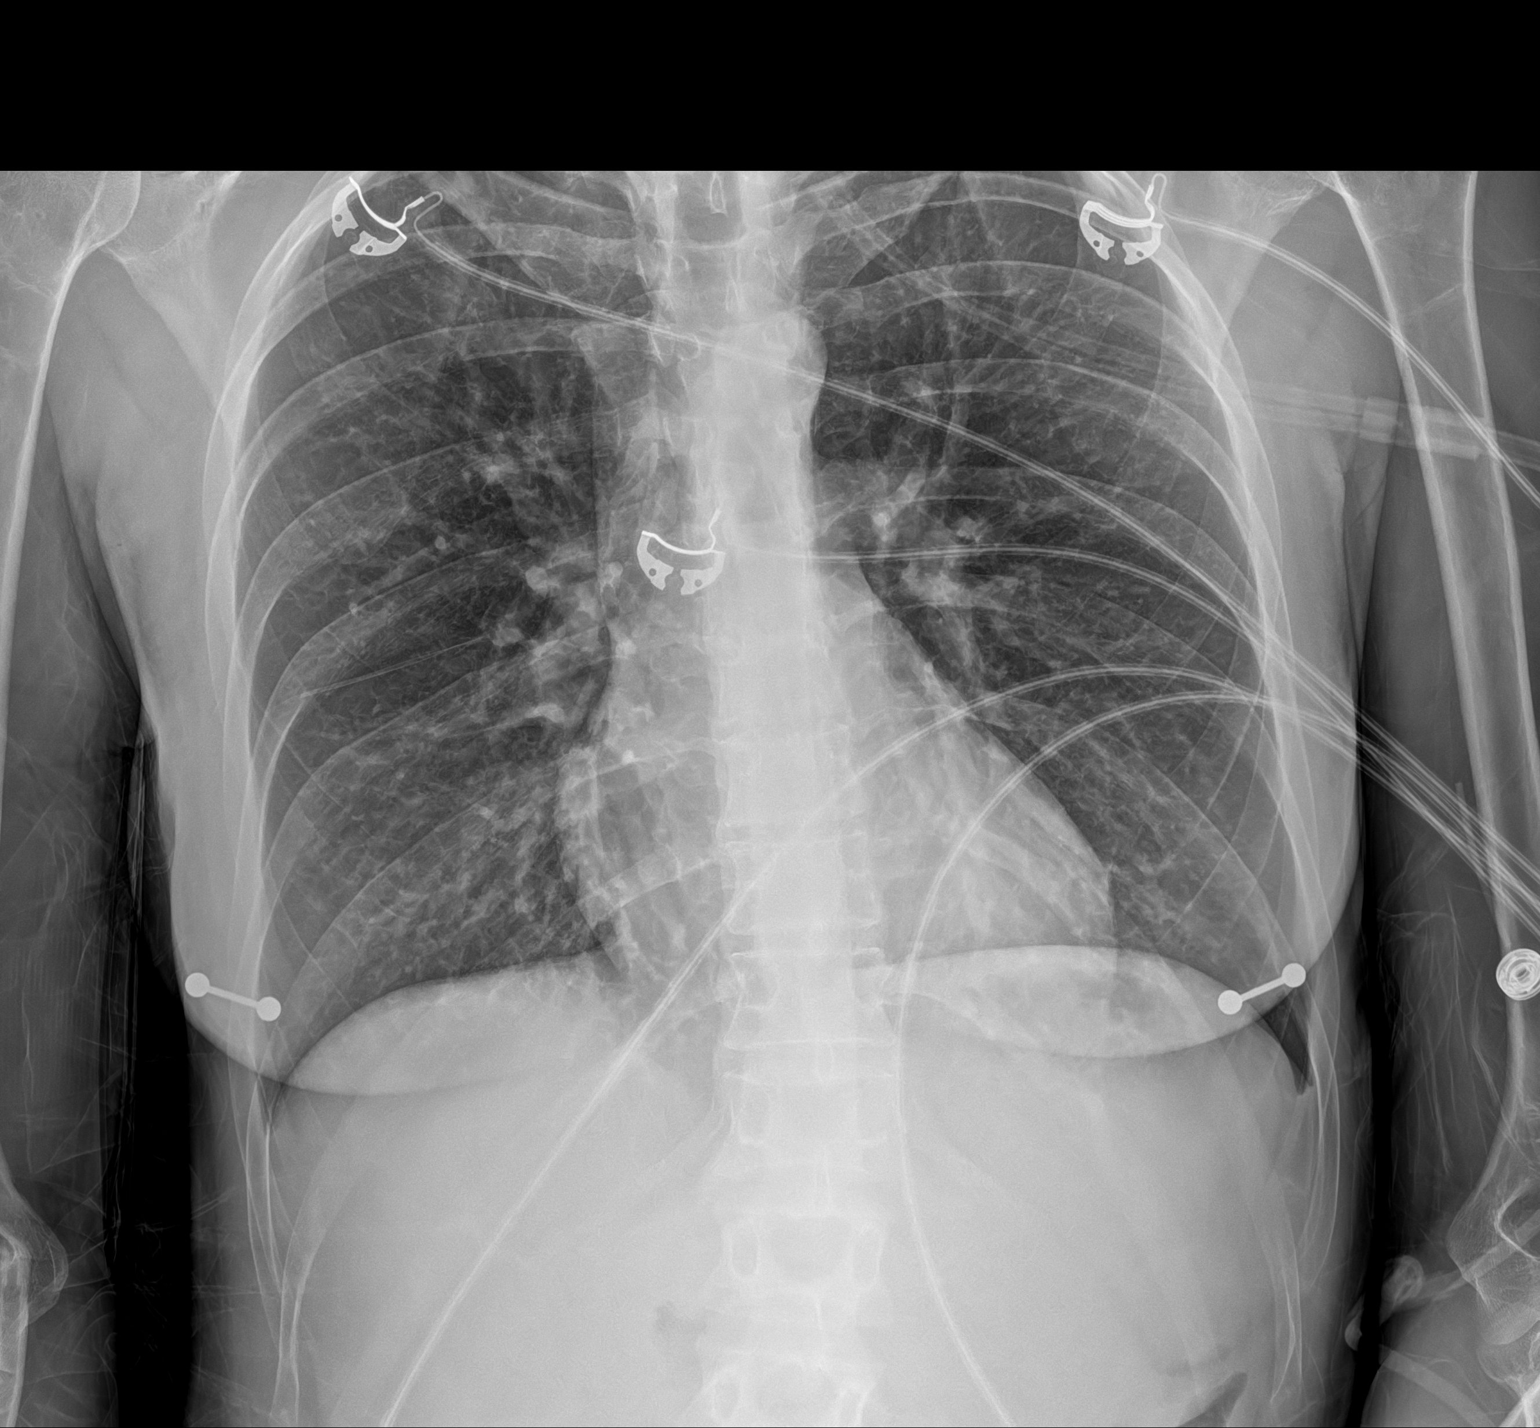

[1 of 1 positions shown; findings below may reference images not displayed]

FINDINGS: Lungs are clear.  No pleural effusion or pneumothorax.

The heart is normal in size.
IMPRESSION: No evidence of acute cardiopulmonary disease.

## 2022-05-04 ENCOUNTER — Inpatient Hospital Stay
Admission: EM | Admit: 2022-05-04 | Discharge: 2022-05-06 | DRG: 392 | Disposition: A | Discharge status: Home / Self Care | Payer: Medicaid Other | Attending: Internal Medicine | Admitting: Internal Medicine

## 2022-05-04 ENCOUNTER — Observation Stay: Payer: Medicaid Other

## 2022-05-04 ENCOUNTER — Other Ambulatory Visit: Payer: Self-pay

## 2022-05-04 ENCOUNTER — Encounter: Payer: Self-pay | Admitting: Family Medicine

## 2022-05-04 DIAGNOSIS — Z8249 Family history of ischemic heart disease and other diseases of the circulatory system: Secondary | ICD-10-CM

## 2022-05-04 DIAGNOSIS — I129 Hypertensive chronic kidney disease with stage 1 through stage 4 chronic kidney disease, or unspecified chronic kidney disease: Secondary | ICD-10-CM | POA: Diagnosis present

## 2022-05-04 DIAGNOSIS — E108 Type 1 diabetes mellitus with unspecified complications: Secondary | ICD-10-CM

## 2022-05-04 DIAGNOSIS — Z794 Long term (current) use of insulin: Secondary | ICD-10-CM

## 2022-05-04 DIAGNOSIS — N179 Acute kidney failure, unspecified: Secondary | ICD-10-CM | POA: Diagnosis present

## 2022-05-04 DIAGNOSIS — F1290 Cannabis use, unspecified, uncomplicated: Secondary | ICD-10-CM

## 2022-05-04 DIAGNOSIS — E1022 Type 1 diabetes mellitus with diabetic chronic kidney disease: Secondary | ICD-10-CM | POA: Diagnosis present

## 2022-05-04 DIAGNOSIS — I160 Hypertensive urgency: Secondary | ICD-10-CM | POA: Diagnosis not present

## 2022-05-04 DIAGNOSIS — Z91041 Radiographic dye allergy status: Secondary | ICD-10-CM

## 2022-05-04 DIAGNOSIS — Z8711 Personal history of peptic ulcer disease: Secondary | ICD-10-CM | POA: Diagnosis not present

## 2022-05-04 DIAGNOSIS — E1065 Type 1 diabetes mellitus with hyperglycemia: Secondary | ICD-10-CM | POA: Diagnosis not present

## 2022-05-04 DIAGNOSIS — Z7951 Long term (current) use of inhaled steroids: Secondary | ICD-10-CM

## 2022-05-04 DIAGNOSIS — G8929 Other chronic pain: Secondary | ICD-10-CM | POA: Diagnosis present

## 2022-05-04 DIAGNOSIS — Z888 Allergy status to other drugs, medicaments and biological substances status: Secondary | ICD-10-CM

## 2022-05-04 DIAGNOSIS — F17210 Nicotine dependence, cigarettes, uncomplicated: Secondary | ICD-10-CM | POA: Diagnosis present

## 2022-05-04 DIAGNOSIS — Z79899 Other long term (current) drug therapy: Secondary | ICD-10-CM

## 2022-05-04 DIAGNOSIS — F1210 Cannabis abuse, uncomplicated: Secondary | ICD-10-CM | POA: Diagnosis present

## 2022-05-04 DIAGNOSIS — R112 Nausea with vomiting, unspecified: Secondary | ICD-10-CM | POA: Diagnosis not present

## 2022-05-04 DIAGNOSIS — F603 Borderline personality disorder: Secondary | ICD-10-CM | POA: Diagnosis present

## 2022-05-04 DIAGNOSIS — M549 Dorsalgia, unspecified: Secondary | ICD-10-CM | POA: Diagnosis present

## 2022-05-04 DIAGNOSIS — N189 Chronic kidney disease, unspecified: Secondary | ICD-10-CM

## 2022-05-04 DIAGNOSIS — K529 Noninfective gastroenteritis and colitis, unspecified: Secondary | ICD-10-CM | POA: Diagnosis present

## 2022-05-04 DIAGNOSIS — N1832 Chronic kidney disease, stage 3b: Secondary | ICD-10-CM | POA: Diagnosis present

## 2022-05-04 DIAGNOSIS — J45909 Unspecified asthma, uncomplicated: Secondary | ICD-10-CM | POA: Diagnosis present

## 2022-05-04 DIAGNOSIS — F1129 Opioid dependence with unspecified opioid-induced disorder: Secondary | ICD-10-CM | POA: Diagnosis present

## 2022-05-04 DIAGNOSIS — R1084 Generalized abdominal pain: Secondary | ICD-10-CM

## 2022-05-04 LAB — COMPREHENSIVE METABOLIC PANEL
ALT: 28 U/L (ref 0–44)
AST: 29 U/L (ref 15–41)
Albumin: 3.3 g/dL — ABNORMAL LOW (ref 3.5–5.0)
Alkaline Phosphatase: 188 U/L — ABNORMAL HIGH (ref 38–126)
Anion gap: 11 (ref 5–15)
BUN: 25 mg/dL — ABNORMAL HIGH (ref 6–20)
CO2: 23 mmol/L (ref 22–32)
Calcium: 9.4 mg/dL (ref 8.9–10.3)
Chloride: 102 mmol/L (ref 98–111)
Creatinine, Ser: 1.8 mg/dL — ABNORMAL HIGH (ref 0.44–1.00)
GFR, Estimated: 38 mL/min — ABNORMAL LOW (ref >60)
Glucose, Bld: 327 mg/dL — ABNORMAL HIGH (ref 70–99)
Potassium: 4.8 mmol/L (ref 3.5–5.1)
Sodium: 136 mmol/L (ref 135–145)
Total Bilirubin: 0.6 mg/dL (ref 0.3–1.2)
Total Protein: 7.1 g/dL (ref 6.5–8.1)

## 2022-05-04 LAB — URINALYSIS, ROUTINE W REFLEX MICROSCOPIC
Bilirubin Urine: NEGATIVE
Glucose, UA: >=500 mg/dL — AB
Hgb urine dipstick: NEGATIVE
Ketones, ur: NEGATIVE mg/dL
Leukocytes,Ua: NEGATIVE
Nitrite: NEGATIVE
Protein, ur: >=300 mg/dL — AB
Specific Gravity, Urine: 1.013 (ref 1.005–1.030)
pH: 7 (ref 5.0–8.0)

## 2022-05-04 LAB — CBC
HCT: 39.6 % (ref 36.0–46.0)
HCT: 34.4 % — ABNORMAL LOW (ref 36.0–46.0)
Hemoglobin: 13.4 g/dL (ref 12.0–15.0)
Hemoglobin: 11.6 g/dL — ABNORMAL LOW (ref 12.0–15.0)
MCH: 28.5 pg (ref 26.0–34.0)
MCH: 28.7 pg (ref 26.0–34.0)
MCHC: 33.8 g/dL (ref 30.0–36.0)
MCHC: 33.7 g/dL (ref 30.0–36.0)
MCV: 84.1 fL (ref 80.0–100.0)
MCV: 85.1 fL (ref 80.0–100.0)
Platelets: 275 10*3/uL (ref 150–400)
Platelets: 216 10*3/uL (ref 150–400)
RBC: 4.71 MIL/uL (ref 3.87–5.11)
RBC: 4.04 MIL/uL (ref 3.87–5.11)
RDW: 12.1 % (ref 11.5–15.5)
RDW: 12.1 % (ref 11.5–15.5)
WBC: 9.2 10*3/uL (ref 4.0–10.5)
WBC: 8.7 10*3/uL (ref 4.0–10.5)
nRBC: 0 % (ref 0.0–0.2)
nRBC: 0 % (ref 0.0–0.2)

## 2022-05-04 LAB — BASIC METABOLIC PANEL
Anion gap: 12 (ref 5–15)
BUN: 25 mg/dL — ABNORMAL HIGH (ref 6–20)
CO2: 22 mmol/L (ref 22–32)
Calcium: 8.9 mg/dL (ref 8.9–10.3)
Chloride: 101 mmol/L (ref 98–111)
Creatinine, Ser: 1.81 mg/dL — ABNORMAL HIGH (ref 0.44–1.00)
GFR, Estimated: 38 mL/min — ABNORMAL LOW (ref >60)
Glucose, Bld: 387 mg/dL — ABNORMAL HIGH (ref 70–99)
Potassium: 4.3 mmol/L (ref 3.5–5.1)
Sodium: 135 mmol/L (ref 135–145)

## 2022-05-04 LAB — GLUCOSE, CAPILLARY
Glucose-Capillary: 406 mg/dL — ABNORMAL HIGH (ref 70–99)
Glucose-Capillary: 312 mg/dL — ABNORMAL HIGH (ref 70–99)
Glucose-Capillary: 234 mg/dL — ABNORMAL HIGH (ref 70–99)
Glucose-Capillary: 225 mg/dL — ABNORMAL HIGH (ref 70–99)
Glucose-Capillary: 151 mg/dL — ABNORMAL HIGH (ref 70–99)
Glucose-Capillary: 86 mg/dL (ref 70–99)

## 2022-05-04 LAB — POC URINE PREG, ED: Preg Test, Ur: NEGATIVE

## 2022-05-04 LAB — LIPASE, BLOOD: Lipase: 38 U/L (ref 11–51)

## 2022-05-04 MED ORDER — IPRATROPIUM-ALBUTEROL 0.5-2.5 (3) MG/3ML IN SOLN: 3.0000 mL | RESPIRATORY_TRACT | Status: DC | PRN

## 2022-05-04 MED ORDER — NIFEDIPINE ER OSMOTIC RELEASE 30 MG PO TB24
30.0000 mg | ORAL_TABLET | Freq: Every day | ORAL | Status: DC
  Administered 2022-05-04 – 2022-05-06 (×3): 30 mg via ORAL
  Filled 2022-05-04 (×3): qty 1

## 2022-05-04 MED ORDER — DROPERIDOL 2.5 MG/ML IJ SOLN: 2.5000 mg | Freq: Once | INTRAMUSCULAR | Status: DC

## 2022-05-04 MED ORDER — ACETAMINOPHEN 650 MG RE SUPP: 650.0000 mg | Freq: Four times a day (QID) | RECTAL | Status: DC | PRN

## 2022-05-04 MED ORDER — PANTOPRAZOLE SODIUM 40 MG IV SOLR
40.0000 mg | Freq: Two times a day (BID) | INTRAVENOUS | Status: DC
  Administered 2022-05-04 – 2022-05-06 (×5): 40 mg via INTRAVENOUS
  Filled 2022-05-04 (×5): qty 10

## 2022-05-04 MED ORDER — ACETAMINOPHEN 325 MG PO TABS: 650.0000 mg | ORAL_TABLET | Freq: Four times a day (QID) | ORAL | Status: DC | PRN

## 2022-05-04 MED ORDER — ONDANSETRON HCL 4 MG/2ML IJ SOLN
4.0000 mg | Freq: Once | INTRAMUSCULAR | Status: AC | PRN
  Administered 2022-05-04: 4 mg via INTRAVENOUS
  Filled 2022-05-04: qty 2

## 2022-05-04 MED ORDER — LABETALOL HCL 5 MG/ML IV SOLN
10.0000 mg | Freq: Once | INTRAVENOUS | Status: AC
  Administered 2022-05-04: 10 mg via INTRAVENOUS
  Filled 2022-05-04: qty 4

## 2022-05-04 MED ORDER — TRAZODONE HCL 50 MG PO TABS
25.0000 mg | ORAL_TABLET | Freq: Every evening | ORAL | Status: DC | PRN
  Administered 2022-05-04 – 2022-05-06 (×2): 25 mg via ORAL
  Filled 2022-05-04 (×2): qty 1

## 2022-05-04 MED ORDER — ENOXAPARIN SODIUM 40 MG/0.4ML IJ SOSY
40.0000 mg | PREFILLED_SYRINGE | INTRAMUSCULAR | Status: DC
  Filled 2022-05-04 (×2): qty 0.4

## 2022-05-04 MED ORDER — METOCLOPRAMIDE HCL 5 MG/ML IJ SOLN
5.0000 mg | Freq: Three times a day (TID) | INTRAMUSCULAR | Status: DC
  Filled 2022-05-04: qty 2

## 2022-05-04 MED ORDER — PROMETHAZINE HCL 25 MG RE SUPP
12.5000 mg | Freq: Four times a day (QID) | RECTAL | Status: DC | PRN
  Filled 2022-05-04: qty 1

## 2022-05-04 MED ORDER — BUPRENORPHINE HCL-NALOXONE HCL 8-2 MG SL SUBL: 0.5000 | SUBLINGUAL_TABLET | SUBLINGUAL | Status: DC

## 2022-05-04 MED ORDER — ALBUTEROL SULFATE HFA 108 (90 BASE) MCG/ACT IN AERS: 2.0000 | INHALATION_SPRAY | RESPIRATORY_TRACT | Status: DC | PRN

## 2022-05-04 MED ORDER — LABETALOL HCL 5 MG/ML IV SOLN
20.0000 mg | INTRAVENOUS | Status: DC | PRN
  Administered 2022-05-04 – 2022-05-05 (×2): 20 mg via INTRAVENOUS
  Filled 2022-05-04 (×2): qty 4

## 2022-05-04 MED ORDER — DROPERIDOL 2.5 MG/ML IJ SOLN
2.5000 mg | Freq: Once | INTRAMUSCULAR | Status: AC
  Administered 2022-05-04: 2.5 mg via INTRAVENOUS
  Filled 2022-05-04: qty 2

## 2022-05-04 MED ORDER — ONDANSETRON HCL 4 MG/2ML IJ SOLN
4.0000 mg | Freq: Four times a day (QID) | INTRAMUSCULAR | Status: DC | PRN
  Administered 2022-05-04 – 2022-05-05 (×4): 4 mg via INTRAVENOUS
  Filled 2022-05-04 (×4): qty 2

## 2022-05-04 MED ORDER — DIPHENHYDRAMINE HCL 50 MG/ML IJ SOLN
12.5000 mg | Freq: Once | INTRAMUSCULAR | Status: AC
  Administered 2022-05-05: 12.5 mg via INTRAVENOUS
  Filled 2022-05-04: qty 1

## 2022-05-04 MED ORDER — DIPHENHYDRAMINE HCL 50 MG/ML IJ SOLN
12.5000 mg | Freq: Once | INTRAMUSCULAR | Status: AC
  Administered 2022-05-04: 12.5 mg via INTRAVENOUS
  Filled 2022-05-04: qty 1

## 2022-05-04 MED ORDER — MAGNESIUM HYDROXIDE 400 MG/5ML PO SUSP: 30.0000 mL | Freq: Every day | ORAL | Status: DC | PRN

## 2022-05-04 MED ORDER — INSULIN ASPART 100 UNIT/ML IJ SOLN
0.0000 [IU] | INTRAMUSCULAR | Status: DC
  Administered 2022-05-04: 9 [IU] via SUBCUTANEOUS
  Administered 2022-05-04: 2 [IU] via SUBCUTANEOUS
  Administered 2022-05-04: 3 [IU] via SUBCUTANEOUS
  Administered 2022-05-04: 7 [IU] via SUBCUTANEOUS
  Administered 2022-05-04: 3 [IU] via SUBCUTANEOUS
  Administered 2022-05-05 (×3): 2 [IU] via SUBCUTANEOUS
  Administered 2022-05-05 – 2022-05-06 (×2): 1 [IU] via SUBCUTANEOUS
  Administered 2022-05-06: 2 [IU] via SUBCUTANEOUS
  Filled 2022-05-04 (×11): qty 1

## 2022-05-04 MED ORDER — ACETAMINOPHEN 10 MG/ML IV SOLN
1000.0000 mg | Freq: Once | INTRAVENOUS | Status: AC
  Administered 2022-05-04: 1000 mg via INTRAVENOUS
  Filled 2022-05-04: qty 100

## 2022-05-04 MED ORDER — LACTATED RINGERS IV BOLUS
1000.0000 mL | Freq: Once | INTRAVENOUS | Status: AC
  Administered 2022-05-04: 1000 mL via INTRAVENOUS

## 2022-05-04 MED ORDER — DROPERIDOL 2.5 MG/ML IJ SOLN
2.5000 mg | Freq: Four times a day (QID) | INTRAMUSCULAR | Status: DC | PRN
  Administered 2022-05-04 – 2022-05-05 (×4): 2.5 mg via INTRAVENOUS
  Filled 2022-05-04 (×8): qty 2

## 2022-05-04 MED ORDER — SODIUM CHLORIDE 0.9 % IV SOLN
12.5000 mg | Freq: Four times a day (QID) | INTRAVENOUS | Status: DC | PRN
  Administered 2022-05-04 – 2022-05-05 (×4): 12.5 mg via INTRAVENOUS
  Filled 2022-05-04 (×2): qty 0.5
  Filled 2022-05-04: qty 12.5
  Filled 2022-05-04: qty 0.5

## 2022-05-04 MED ORDER — ONDANSETRON HCL 4 MG PO TABS: 4.0000 mg | ORAL_TABLET | Freq: Four times a day (QID) | ORAL | Status: DC | PRN

## 2022-05-04 MED ORDER — DIPHENHYDRAMINE HCL 50 MG/ML IJ SOLN
25.0000 mg | INTRAMUSCULAR | Status: AC
  Administered 2022-05-04: 25 mg via INTRAVENOUS
  Filled 2022-05-04: qty 1

## 2022-05-04 MED ORDER — SODIUM CHLORIDE 0.9 % IV SOLN: INTRAVENOUS | Status: DC

## 2022-05-04 MED ORDER — INSULIN GLARGINE-YFGN 100 UNIT/ML ~~LOC~~ SOLN
16.0000 [IU] | Freq: Every day | SUBCUTANEOUS | Status: DC
  Administered 2022-05-04 – 2022-05-06 (×3): 16 [IU] via SUBCUTANEOUS
  Filled 2022-05-04 (×3): qty 0.16

## 2022-05-04 NOTE — Assessment & Plan Note (Signed)
-  Will be placed on IV PPI therapy.

## 2022-05-04 NOTE — Assessment & Plan Note (Addendum)
-  Will be placed on as needed IV labetalol. - We will start on p.o. Lopressor.

## 2022-05-04 NOTE — Assessment & Plan Note (Addendum)
-  Given her diarrhea and generalized abdominal pain, she could be having acute gastroenteritis. 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  Cyclical vomiting syndrome is in the differential diagnosis as well.  She had negative workup for diabetic gastroparesis at [facility]. - Will be admitted to an observation medical telemetry bed. - She will be placed on as needed IV Zofran and Phenergan as well as Naprosyn that should help with her abdominal pain. - Her abdominal exam is fairly benign. - I discussed with her that we will avoid narcotics/opioids while having her on Suboxone and she understands and agrees to stay.

## 2022-05-04 NOTE — Inpatient Diabetes Management (Addendum)
Inpatient Diabetes Program Recommendations  AACE/ADA: New Consensus Statement on Inpatient Glycemic Control   Target Ranges:  Prepandial:   less than 140 mg/dL      Peak postprandial:   less than 180 mg/dL (1-2 hours)      Critically ill patients:  140 - 180 mg/dL    Latest Reference Range & Units 05/04/22 04:46 05/04/22 08:25  Glucose-Capillary 70 - 99 mg/dL 406 (H) 312 (H)    Latest Reference Range & Units 05/04/22 01:05  CO2 22 - 32 mmol/L 23  Glucose 70 - 99 mg/dL 327 (H)  Anion gap 5 - 15  11    Latest Reference Range & Units 07/12/20 23:14 12/18/20 00:13 03/10/22 17:00  Hemoglobin A1C 4.8 - 5.6 % 12.8 (H) 11.5 (H) 10.4 (H)    Review of Glycemic Control  Diabetes history: DM1 (does NOT make any insulin; requires basal, correction, and carb coverage insulin) Outpatient Diabetes medications: Lantus 16 units QHS, Novolog 2-5 units TID plus 1 unit per 50 mg/dl above 200 mg/dl Current orders for Inpatient glycemic control: Semglee 16 units daily, Novolog 0-9 units Q4H  Inpatient Diabetes Program Recommendations:    Insulin: Once diet advanced and patient tolerating diet, consider ordering Novolog 2 units TID with meals for meal coverage if patient eats at least 50% of meals.  HbgA1C:  A1C 10.4% on 03/10/22 indicating an average glucose of 252 mg/dl.  Addendum 05/04/22@14 :10-Spoke with patient at bedside about diabetes and home regimen for diabetes control. Patient reports being followed by [facility] for diabetes management and currently taking Lantus 16 units QHS and Novolog 2-5 units TID with meals plus correction (1 unit per 50 mg/dl above 200 mg/dl) as an outpatient for diabetes control. Patient reports that she is not good about taking DM medications consistently as prescribed. Patient reports that she is using Dexcom G6 with reader device. Patient's current glucose 218 mg/dl on Dexcom reader.  Discussed recent A1C results (10.4% on 03/10/22) and explained that A1C indicates  an average glucose of 252 mg/dl over the past 2-3 months. Patient reports that A1C in December was improved from prior A1C of 14%.  Discussed glucose and A1C goals. Discussed importance of improving DM control to decrease risk of complications from uncontrolled DM. Patient states she is in the process of getting an insulin pump. She just recently got approved for Medicaid and her Endocrinologist has prescribed Tandem T-Slim insulin pump; she is just waiting for insurance approval.  Patient reports she used an insulin pump when she was young and she feels that getting back on an insulin pump will help her get her DM under better control.  Patient states she has everything she needs for DM control at home. Encouraged patient to continue to work with Endocrinologist regarding insulin pump and getting DM under better control. Patient verbalized understanding of information discussed and reports no further questions at this time related to diabetes.  Thanks, Barnie Alderman, RN, MSN, [facility] Diabetes Coordinator Inpatient Diabetes Program [phone] (Team Pager from 8am to [place])

## 2022-05-04 NOTE — Progress Notes (Signed)
  Progress Note   Patient: Ann Moyer [id number] DOB: 1990-04-17 DOA: 05/04/2022     0 DOS: the patient was seen and examined on 05/04/2022   Brief hospital course:  Assessment and Plan: * Intractable nausea and vomiting - IV NS 100 cc/hr  - IV Droperidol 2.5 mg q6 hr PRN  - IV phenergan 12.5 mg q6 hr PRN   Hypertensive urgency - Will be placed on as needed IV labetalol - Nifedipine 30 mg PO daily   Uncontrolled type 1 diabetes mellitus with hyperglycemia (HCC) - Novolog SS q4 hr - Glargine 16 units sq daily   History of peptic ulcer disease - Will be placed on IV PPI therapy.  Asthma - Duoneb q4 hr PRN   DVT prophylaxis: Lovenox 40 mg sq daily  GI prophylaxis: IV protonix 40 mg q12      Subjective: Pt seen and examined at the bedside. She did not accept reglan. CT abd/pel for abdominal pain did not show any acute pathology. IV fluids started for AKI.   Physical Exam: Vitals:   05/04/22 0300 05/04/22 0330 05/04/22 0445 05/04/22 0852  BP: (!) 182/103 (!) 177/96 (!) 191/106 (!) 186/95  Pulse: 95 86 83 92  Resp: 18 18 18 17   Temp:   98.5 F (36.9 C) 98.6 F (37 C)  TempSrc:   Oral Oral  SpO2: 98% 98% 98% 98%  Weight:      Height:       Physical Exam Constitutional:      Appearance: She is ill-appearing.  HENT:     Head: Normocephalic.  Cardiovascular:     Rate and Rhythm: Normal rate and regular rhythm.  Pulmonary:     Effort: Pulmonary effort is normal.     Breath sounds: Normal breath sounds.  Abdominal:     General: Abdomen is flat.  Skin:    General: Skin is warm.  Neurological:     Mental Status: She is alert and oriented to person, place, and time.  Psychiatric:        Mood and Affect: Mood normal.    Data Reviewed:   Disposition: Status is: Observation  Planned Discharge Destination: Home    Time spent: 35 minutes  Author: Lucienne Minks , MD 05/04/2022 2:32 PM  For on call review [email or web address].

## 2022-05-04 NOTE — ED Triage Notes (Addendum)
Pt reports that she began having generalized abd pain with NVD on the morning of 1/28. Pt reports this happens occasionally and they aren't sure what causes it. Last time was December. PT vomiting in triage.

## 2022-05-04 NOTE — Assessment & Plan Note (Signed)
-  We will hold off her Symbicort and placed on as needed DuoNebs.

## 2022-05-04 NOTE — Assessment & Plan Note (Signed)
-  Will be placed on supplement coverage with NovoLog and will continue her basal coverage.

## 2022-05-04 NOTE — H&P (Addendum)
[place]   PATIENT NAME: Ann Moyer    MR#:  [id number]  DATE OF BIRTH:  Nov 11, 1990  DATE OF ADMISSION:  05/04/2022  PRIMARY CARE PHYSICIAN: [facility]   Patient is coming from: Home  REQUESTING/REFERRING PHYSICIAN: Loleta Rose, MD  CHIEF COMPLAINT:   Chief Complaint  Patient presents with   Abdominal Pain    HISTORY OF PRESENT ILLNESS:  Ann Moyer is a 32 y.o. Caucasian female with medical history significant for type 1 diabetes mellitus, asthma, borderline personality disorder, stage IIIb chronic kidney disease, opioid use disorder and peptic ulcer disease, as well as hypertension, tobacco abuse and cannabinoid abuse, who presented to the emergency room with acute onset of recurrent nausea and vomiting since this afternoon and diarrhea this morning with loose bowel movements.  She denies any fever or chills.  She complained of abdominal pain all over.  No dysuria, oliguria, urinary frequency however she has some urinary urgency.  No flank pain or hematuria.  No chest pain or palpitations.  No cough or wheezing or dyspnea.  She admitted to smoking marijuana sometime this week.  ED Course: When he came to the ER, BP was 196/160 and later 224/138 with otherwise normal vital signs.  Labs revealed a BUN of 25 and creatinine 1.8 with alk phos of 188 serum lipase of 38, albumin 3.3 with total protein 7.1.  CBC was within normal.  A blood glucose was 327.  Urine pregnancy test was negative.  UA showed more than 300 protein and more than 500 glucose. EKG as reviewed by me : showed normal sinus rhythm with a rate of 90 with right atrial enlargement and poor R wave progression 88. Imaging: None.  The patient was given 25 mg of IV Benadryl twice, 2.5 mg of IV Inapsine twice, 10 mg of IV labetalol, 4 mg of IV Zofran and 1 L bolus of IV lactated Ringer.  She will be admitted to an observation medical telemetry bed for further evaluation and management. PAST MEDICAL HISTORY:    Past Medical History:  Diagnosis Date   Asthma    Borderline personality disorder (HCC)    CKD (chronic kidney disease)    Diabetes mellitus without complication (HCC)    Opioid use disorder     PAST SURGICAL HISTORY:   Past Surgical History:  Procedure Laterality Date   BIOPSY  07/13/2020   Procedure: BIOPSY;  Surgeon: Lanelle Bal, DO;  [facility];  Service: Endoscopy;;  gastric, esophageal   BIOPSY  11/21/2020   Procedure: BIOPSY;  Surgeon: Corbin Ade, MD;  Location[facility];  Service: Endoscopy;;   ESOPHAGOGASTRODUODENOSCOPY (EGD) WITH PROPOFOL N/A 07/13/2020   LA Grade D erosive esophagitis without bleeding, gastritis s/p biopsy, normal duodenum. Found to have candida esophagitis at that time   ESOPHAGOGASTRODUODENOSCOPY (EGD) WITH PROPOFOL N/A 11/21/2020   Surgeon: Corbin Ade, MD;  normal-appearing esophagus, patchy gastric erosion and erythema of doubtful clinical significance s/p biopsy, normal examined duodenum.  Pathology with antral and oxyntic mucosa with mild changes of reactive gastropathy and slight chronic inflammation, negative for H. pylori.   EYE SURGERY      SOCIAL HISTORY:   Social History   Tobacco Use   Smoking status: Every Day    Packs/day: 0.50    Types: Cigarettes   Smokeless tobacco: Never  Substance Use Topics   Alcohol use: Not Currently  She admitted to marijuana abuse.  FAMILY HISTORY:  Positive for CVA,  hypertension and coronary artery disease  DRUG ALLERGIES:   Allergies  Allergen Reactions   Amitriptyline Swelling and Other (See Comments)    Legs and feet    Gabapentin Swelling    swelling   Ivp Dye [Iodinated Contrast Media]     Kidney damage   Pregabalin Swelling    swelling   Tramadol Itching and Swelling    Swelling, itching Eye and face swelling    Haloperidol Other (See Comments)    Uncontrollable movements  "uncontrolled movement of my body, like tremors"; previously charted to  cause nausea and vomiting.     Toradol [Ketorolac Tromethamine] Nausea Only   Ketorolac Itching   Metoclopramide Itching    Jittery and itchy Jittery and itchy     REVIEW OF SYSTEMS:   ROS As per history of present illness. All pertinent systems were reviewed above. Constitutional, HEENT, cardiovascular, respiratory, GI, GU, musculoskeletal, neuro, psychiatric, endocrine, integumentary and hematologic systems were reviewed and are otherwise negative/unremarkable except for positive findings mentioned above in the HPI.   MEDICATIONS AT HOME:   Prior to Admission medications   Medication Sig Start Date End Date Taking? Authorizing Provider  amphetamine-dextroamphetamine (ADDERALL) 20 MG tablet Take 20 mg by mouth daily. 04/21/22  Yes [provider]  clindamycin (CLINDAGEL) 1 % gel Apply 1 Application topically 2 (two) times daily. 03/31/22  Yes [provider]  insulin aspart (NOVOLOG) 100 UNIT/ML FlexPen Inject 2-5 Units into the skin 2 (two) times daily before a meal. Sliding scale 03/11/22  Yes Shahmehdi, Seyed A, MD  insulin glargine (LANTUS) 100 UNIT/ML Solostar Pen Inject 16 Units into the skin at bedtime. 03/11/22 09/07/22 Yes Shahmehdi, Valeria Batman, MD  SYMBICORT 80-4.5 MCG/ACT inhaler Inhale 2 puffs into the lungs daily. 04/01/22  Yes [provider]  albuterol (VENTOLIN HFA) 108 (90 Base) MCG/ACT inhaler Inhale 2 puffs into the lungs every 4 (four) hours as needed for wheezing or shortness of breath. 01/26/13   [provider]  buprenorphine-naloxone (SUBOXONE) 8-2 mg SUBL SL tablet Place 0.5 tablets under the tongue in the morning, at noon, in the evening, and at bedtime. Patient not taking: Reported on 03/11/2022 07/10/20   [provider]  ondansetron (ZOFRAN) 4 MG tablet Take 1 tablet (4 mg total) by mouth every 6 (six) hours as needed for nausea. 03/11/22   Shahmehdi, Valeria Batman, MD  promethazine (PHENERGAN) 12.5 MG suppository Place 1  suppository (12.5 mg total) rectally every 6 (six) hours as needed for up to 24 doses for nausea or vomiting. 03/11/22   Shahmehdi, Valeria Batman, MD      VITAL SIGNS:  Blood pressure (!) 191/106, pulse 83, temperature 98.5 F (36.9 C), temperature source Oral, resp. rate 18, height 5\' 5"  (1.651 m), weight 52.2 kg, SpO2 98 %.  PHYSICAL EXAMINATION:  Physical Exam  GENERAL:  32 y.o.-year-old Caucasian female patient lying in the bed with no acute distress.  EYES: Pupils equal, round, reactive to light and accommodation. No scleral icterus. Extraocular muscles intact.  HEENT: Head atraumatic, normocephalic. Oropharynx and nasopharynx clear.  NECK:  Supple, no jugular venous distention. No thyroid enlargement, no tenderness.  LUNGS: Normal breath sounds bilaterally, no wheezing, rales,rhonchi or crepitation. No use of accessory muscles of respiration.  CARDIOVASCULAR: Regular rate and rhythm, S1, S2 normal. No murmurs, rubs, or gallops.  ABDOMEN: Soft, nondistended, nontender. Bowel sounds present. No organomegaly or mass.  EXTREMITIES: No pedal edema, cyanosis, or clubbing.  NEUROLOGIC: Cranial nerves II through XII are intact.  Muscle strength 5/5 in all extremities. Sensation intact. Gait not checked.  PSYCHIATRIC: The patient is alert and oriented x 3.  Normal affect and good eye contact. SKIN: No obvious rash, lesion, or ulcer.   LABORATORY PANEL:   CBC Recent Labs  Lab 05/04/22 0410  WBC 8.7  HGB 11.6*  HCT 34.4*  PLT 216   ------------------------------------------------------------------------------------------------------------------  Chemistries  Recent Labs  Lab 05/04/22 0105 05/04/22 0410  NA 136 135  K 4.8 4.3  CL 102 101  CO2 23 22  GLUCOSE 327* 387*  BUN 25* 25*  CREATININE 1.80* 1.81*  CALCIUM 9.4 8.9  AST 29  --   ALT 28  --   ALKPHOS 188*  --   BILITOT 0.6  --     ------------------------------------------------------------------------------------------------------------------  Cardiac Enzymes No results for input(s): "TROPONINI" in the last 168 hours. ------------------------------------------------------------------------------------------------------------------  RADIOLOGY:  No results found.    IMPRESSION AND PLAN:  Assessment and Plan: * Intractable nausea and vomiting - Given her diarrhea and generalized abdominal pain, she could be having acute gastroenteritis. 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  Cyclical vomiting syndrome is in the differential diagnosis as well.  She had negative workup for diabetic gastroparesis at [facility]. - Will be admitted to an observation medical telemetry bed. - She will be placed on as needed IV Zofran and Phenergan as well as Naprosyn that should help with her abdominal pain. - Her abdominal exam is fairly benign. - I discussed with her that we will avoid narcotics/opioids while having her on Suboxone and she understands and agrees to stay.  Hypertensive urgency - Will be placed on as needed IV labetalol. - We will start on p.o. Lopressor.  Uncontrolled type 1 diabetes mellitus with hyperglycemia ([place]) - Will be placed on supplement coverage with NovoLog and will continue her basal coverage.  History of peptic ulcer disease - Will be placed on IV PPI therapy.  Asthma - We will hold off her Symbicort and placed on as needed DuoNebs.   DVT prophylaxis: Lovenox.  Advanced Care Planning:  Code Status: full code.  Family Communication:  The plan of care was discussed in details with the patient (and family). I answered all questions. The patient agreed to proceed with the above mentioned plan. Further management will depend upon hospital course. Disposition Plan: Back to previous home environment Consults called: none.  All the records are reviewed and case discussed with ED  provider.  Status is: Observation  I certify that at the time of admission, it is my clinical judgment that the patient will require  hospital care extending less than 2 midnights.                            Dispo: The patient is from: Home              Anticipated d/c is to: Home              Patient currently is not medically stable to d/c.              Difficult to place patient: No  Christel Mormon M.D on 05/04/2022 at 5:03 AM  [facility]   From 7 PM-7 AM, contact night-coverage [email or web address]  CC: Primary care physician; [facility]

## 2022-05-04 NOTE — Progress Notes (Signed)
Mobility Specialist - Progress Note   05/04/22 1517  Mobility  Activity Ambulated independently in hallway  Level of Assistance Independent  Assistive Device  (IV pole)  Distance Ambulated (ft) 800 ft  Activity Response Tolerated well  Mobility Referral Yes  $Mobility charge 1 Mobility   Pt amb 5 laps around NS, tolerated well. Pt left amb in room with needs within reach.   Candie Mile Mobility Specialist 05/04/22 3:18 PM

## 2022-05-04 NOTE — ED Provider Notes (Signed)
[facility] Provider Note    Event Date/Time   First MD Initiated Contact with Patient 05/04/22 0111     (approximate)   History   Abdominal Pain   HPI  Ann Moyer is a 32 y.o. female whose medical history includes opioid use disorder, type 1 diabetes, uncontrolled hypertension, chronic kidney disease, borderline personality disorder, chronic back pain, cyclic vomiting syndrome versus cannabinoid hyperemesis syndrome.  She presents tonight for evaluation of abdominal pain, nausea, vomiting, and diarrhea.  She said that the symptoms started yesterday morning.  She said this happens from time to time and no one can tell her what is wrong.  She has been admitted multiple times to [facility] and said that she has been to [facility] as well.  Most of her doctors are at [facility].  The last time this happened to her she reports was in December (approximately 1 to 2 months ago.  She denies fever, chills, dysuria.  She said that the symptoms have all started within the last 24 hours and that she has not been able to tolerate any fluid or food.  She said that the pain is aching and sharp and throughout her abdomen and up into her chest.  The symptoms feel similar to prior episodes.  She denies drug and alcohol use other than marijuana but she says she does not smoke very much.     Physical Exam   Triage Vital Signs: ED Triage Vitals  Enc Vitals Group     BP 05/04/22 0105 (!) 196/116     Pulse Rate 05/04/22 0102 86     Resp 05/04/22 0102 17     Temp 05/04/22 0102 98.4 F (36.9 C)     Temp Source 05/04/22 0102 Oral     SpO2 05/04/22 0102 97 %     Weight 05/04/22 0102 52.2 kg (115 lb)     Height 05/04/22 0102 1.651 m (5\' 5" )     Head Circumference --      Peak Flow --      Pain Score 05/04/22 0102 8     Pain Loc --      Pain Edu? --      Excl. in [place]? --     Most recent vital signs: Vitals:   05/04/22 0300 05/04/22 0330  BP: (!) 182/103 (!) 177/96  Pulse: 95 86   Resp: 18 18  Temp:    SpO2: 98% 98%     General: Awake, appears older than chronological age.  Appears uncomfortable but nontoxic. CV:  Good peripheral perfusion.  Borderline tachycardia, regular rhythm. Resp:  Normal effort. Speaking easily and comfortably, no accessory muscle usage nor intercostal retractions.  Lungs are clear to auscultation. Abd:  No distention.  The patient indicated no tenderness to outpatient on abdominal exam.  When I told her to lie back so that I could examine her abdomen, she was initially guarding and crying in anticipation, but during the actual exam, she was giving me additional history and indicated no discomfort. Other:  Patient is reporting severe discomfort, moaning and rocking back-and-forth, gagging without producing emesis initially, eventually producing a small amount of saliva and bile.   ED Results / Procedures / Treatments   Labs (all labs ordered are listed, but only abnormal results are displayed) Labs Reviewed  COMPREHENSIVE METABOLIC PANEL - Abnormal; Notable for the following components:      Result Value   Glucose, Bld 327 (*)    BUN 25 (*)  Creatinine, Ser 1.80 (*)    Albumin 3.3 (*)    Alkaline Phosphatase 188 (*)    GFR, Estimated 38 (*)    All other components within normal limits  URINALYSIS, ROUTINE W REFLEX MICROSCOPIC - Abnormal; Notable for the following components:   Color, Urine YELLOW (*)    APPearance HAZY (*)    Glucose, UA >=500 (*)    Protein, ur >=300 (*)    Bacteria, UA RARE (*)    All other components within normal limits  LIPASE, BLOOD  CBC  POC URINE PREG, ED     EKG  ED ECG REPORT I, Hinda Kehr, the attending physician, personally viewed and interpreted this ECG.  Date: 05/04/2022 EKG Time: 1:12 AM Rate: 90 Rhythm: normal sinus rhythm QRS Axis: normal Intervals: normal ST/T Wave abnormalities: normal Narrative Interpretation: no evidence of acute ischemia    RADIOLOGY No indication for  emergent imaging    PROCEDURES:  Critical Care performed: No  .1-3 Lead EKG Interpretation  Performed by: Hinda Kehr, MD Authorized by: Hinda Kehr, MD     Interpretation: normal     ECG rate:  85   ECG rate assessment: normal     Rhythm: sinus rhythm     Ectopy: none     Conduction: normal      MEDICATIONS ORDERED IN ED: Medications  ondansetron (ZOFRAN) injection 4 mg (4 mg Intravenous Given 05/04/22 0107)  lactated ringers bolus 1,000 mL (0 mLs Intravenous Stopped 05/04/22 0250)  diphenhydrAMINE (BENADRYL) injection 25 mg (25 mg Intravenous Given 05/04/22 0156)  droperidol (INAPSINE) 2.5 MG/ML injection 2.5 mg (2.5 mg Intravenous Given 05/04/22 0156)  droperidol (INAPSINE) 2.5 MG/ML injection 2.5 mg (2.5 mg Intravenous Given 05/04/22 0255)  diphenhydrAMINE (BENADRYL) injection 25 mg (25 mg Intravenous Given 05/04/22 0255)  labetalol (NORMODYNE) injection 10 mg (10 mg Intravenous Given 05/04/22 0255)     IMPRESSION / MDM / ASSESSMENT AND PLAN / ED COURSE  I reviewed the triage vital signs and the nursing notes.                              Differential diagnosis includes, but is not limited to, cyclic vomiting syndrome, cannabinoid hyperemesis syndrome, diabetic gastroparesis, SBO/ileus, opioid seeking behavior, electrolyte or metabolic abnormality including DKA, acute on chronic disease.  Patient's presentation is most consistent with acute presentation with potential threat to life or bodily function.  Vital signs are notable for uncontrolled hypertension.  I verified in the medical record on multiple prior hospitalizations over the last year at [facility] that she typically is hypertensive and usually gets doses of labetalol.  Her presentation and exam are very consistent with cannabinoid hyperemesis syndrome versus cyclic vomiting.  She has no tenderness to palpation of the abdomen which is reassuring.  Given that she has a documented type I diabetic, we will  proceed with full laboratory evaluation.  She has multiple reported allergies including haloperidol, which is listed as "uncontrolled movement".  This sounds like akathisia.  However, I also see multiple notes from [facility] and from [facility], [facility], [facility] health documenting her opioid dependence and Suboxone treatment.  There is no indication for narcotic treatment tonight and the best thing for her would be a medication such as droperidol to treat the cyclic vomiting /cannabinoid hyperemesis and nausea/vomiting in general.  I am ordering droperidol with Benadryl to counteract the possibility of akathisia and I feel that this is by far the best  thing for her, and that the benefit outweighs the risk.  Additional interventions as listed below.  Labs/studies ordered: Comprehensive metabolic panel, CBC, lipase, urinalysis, urine pregnancy test. Interventions/Medications given (additional interventions may be listed in hospital course): Droperidol 2.5 mg IV, diphenhydramine 25 mg IV, LR 1 L IV bolus.  The patient is on the cardiac monitor to evaluate for evidence of arrhythmia and/or significant heart rate changes.  Clinical Course as of 05/04/22 0358  Mon May 04, 2022  0130 Lab work is generally quite reassuring.  No indication of UTI, normal CBC, normal lipase, CMP shows stable chronic kidney disease with creatinine of 1.8.  Patient is hyperglycemic at 327 but that is to be expected with her diabetes, and she has a normal anion gap which is reassuring.  Her bicarb is 60 which is also within normal limits..  Urine pregnancy test is negative. [CF]  5732 Patient seems better but is still reporting severe symptoms.  I will order another droperidol 2.5 mg IV and Benadryl 25 mg IV. [CF]  0347 Patient seemed to be sleeping comfortably and responded quickly to light voice.  She started grimacing again when I woke her up and she said that she was not sleeping, she was meditating to try to make the pain better.  She says she feels no  better from prior medications.  I talked with her about her prior admissions for intractable nausea and vomiting with abdominal pain and said that if we cannot control the symptoms of beats we can do it again.  I explained, however, that she would not be treated with opioids and we would treat her with anything that we can do it is nonnarcotic.  She stated that she usually gets opioids when she goes to the emergency department, and I explained that I looked through several prior visits and it was not the case, and additionally I feel strongly about not treating cyclic vomiting syndrome, cannabinoid hyperemesis syndrome, diabetic gastroparesis, etc., with opioids.  I we will be happy to admit her to the hospital if that is what she needs, but we would not be treating her with narcotics.  She is displeased with this but said that she understands.  Given her intractable symptoms similar to prior as well as her uncontrolled hypertension, I will consult the hospitalist for admission.  I still do not believe that she would benefit from additional imaging. [CF]  2025 Consulted Dr. Arville Care with the hospitalist service and he will admit the patient. [CF]    Clinical Course User Index [CF] Loleta Rose, MD     FINAL CLINICAL IMPRESSION(S) / ED DIAGNOSES   Final diagnoses:  Intractable nausea and vomiting  Generalized abdominal pain  Opioid dependence with opioid-induced disorder (HCC)  Acute DM type 1 causing complication (HCC)  Chronic kidney disease, unspecified CKD stage  Hypertensive urgency     Rx / DC Orders   ED Discharge Orders     None        Note:  This document was prepared using Dragon voice recognition software and may include unintentional dictation errors.   Loleta Rose, MD 05/04/22 [phone]

## 2022-05-05 DIAGNOSIS — Z91041 Radiographic dye allergy status: Secondary | ICD-10-CM | POA: Diagnosis not present

## 2022-05-05 DIAGNOSIS — F603 Borderline personality disorder: Secondary | ICD-10-CM | POA: Diagnosis present

## 2022-05-05 DIAGNOSIS — Z8249 Family history of ischemic heart disease and other diseases of the circulatory system: Secondary | ICD-10-CM | POA: Diagnosis not present

## 2022-05-05 DIAGNOSIS — N179 Acute kidney failure, unspecified: Secondary | ICD-10-CM | POA: Diagnosis present

## 2022-05-05 DIAGNOSIS — M549 Dorsalgia, unspecified: Secondary | ICD-10-CM | POA: Diagnosis present

## 2022-05-05 DIAGNOSIS — Z8711 Personal history of peptic ulcer disease: Secondary | ICD-10-CM | POA: Diagnosis not present

## 2022-05-05 DIAGNOSIS — R112 Nausea with vomiting, unspecified: Secondary | ICD-10-CM | POA: Diagnosis present

## 2022-05-05 DIAGNOSIS — Z7951 Long term (current) use of inhaled steroids: Secondary | ICD-10-CM | POA: Diagnosis not present

## 2022-05-05 DIAGNOSIS — Z888 Allergy status to other drugs, medicaments and biological substances status: Secondary | ICD-10-CM | POA: Diagnosis not present

## 2022-05-05 DIAGNOSIS — N1832 Chronic kidney disease, stage 3b: Secondary | ICD-10-CM | POA: Diagnosis present

## 2022-05-05 DIAGNOSIS — K529 Noninfective gastroenteritis and colitis, unspecified: Secondary | ICD-10-CM | POA: Diagnosis present

## 2022-05-05 DIAGNOSIS — Z79899 Other long term (current) drug therapy: Secondary | ICD-10-CM | POA: Diagnosis not present

## 2022-05-05 DIAGNOSIS — I129 Hypertensive chronic kidney disease with stage 1 through stage 4 chronic kidney disease, or unspecified chronic kidney disease: Secondary | ICD-10-CM | POA: Diagnosis present

## 2022-05-05 DIAGNOSIS — E1022 Type 1 diabetes mellitus with diabetic chronic kidney disease: Secondary | ICD-10-CM | POA: Diagnosis present

## 2022-05-05 DIAGNOSIS — E1065 Type 1 diabetes mellitus with hyperglycemia: Secondary | ICD-10-CM | POA: Diagnosis present

## 2022-05-05 DIAGNOSIS — I160 Hypertensive urgency: Secondary | ICD-10-CM | POA: Diagnosis present

## 2022-05-05 DIAGNOSIS — F1210 Cannabis abuse, uncomplicated: Secondary | ICD-10-CM | POA: Diagnosis present

## 2022-05-05 DIAGNOSIS — R1084 Generalized abdominal pain: Secondary | ICD-10-CM | POA: Diagnosis present

## 2022-05-05 DIAGNOSIS — Z794 Long term (current) use of insulin: Secondary | ICD-10-CM | POA: Diagnosis not present

## 2022-05-05 DIAGNOSIS — G8929 Other chronic pain: Secondary | ICD-10-CM | POA: Diagnosis present

## 2022-05-05 DIAGNOSIS — F17210 Nicotine dependence, cigarettes, uncomplicated: Secondary | ICD-10-CM | POA: Diagnosis present

## 2022-05-05 DIAGNOSIS — F1129 Opioid dependence with unspecified opioid-induced disorder: Secondary | ICD-10-CM | POA: Diagnosis present

## 2022-05-05 LAB — GLUCOSE, CAPILLARY
Glucose-Capillary: 139 mg/dL — ABNORMAL HIGH (ref 70–99)
Glucose-Capillary: 149 mg/dL — ABNORMAL HIGH (ref 70–99)
Glucose-Capillary: 159 mg/dL — ABNORMAL HIGH (ref 70–99)
Glucose-Capillary: 164 mg/dL — ABNORMAL HIGH (ref 70–99)
Glucose-Capillary: 180 mg/dL — ABNORMAL HIGH (ref 70–99)
Glucose-Capillary: 199 mg/dL — ABNORMAL HIGH (ref 70–99)
Glucose-Capillary: 57 mg/dL — ABNORMAL LOW (ref 70–99)

## 2022-05-05 LAB — COMPREHENSIVE METABOLIC PANEL
ALT: 21 U/L (ref 0–44)
AST: 21 U/L (ref 15–41)
Albumin: 3.1 g/dL — ABNORMAL LOW (ref 3.5–5.0)
Alkaline Phosphatase: 146 U/L — ABNORMAL HIGH (ref 38–126)
Anion gap: 7 (ref 5–15)
BUN: 26 mg/dL — ABNORMAL HIGH (ref 6–20)
CO2: 24 mmol/L (ref 22–32)
Calcium: 8.5 mg/dL — ABNORMAL LOW (ref 8.9–10.3)
Chloride: 106 mmol/L (ref 98–111)
Creatinine, Ser: 2.08 mg/dL — ABNORMAL HIGH (ref 0.44–1.00)
GFR, Estimated: 32 mL/min — ABNORMAL LOW (ref >60)
Glucose, Bld: 175 mg/dL — ABNORMAL HIGH (ref 70–99)
Potassium: 3.3 mmol/L — ABNORMAL LOW (ref 3.5–5.1)
Sodium: 137 mmol/L (ref 135–145)
Total Bilirubin: 0.6 mg/dL (ref 0.3–1.2)
Total Protein: 6.3 g/dL — ABNORMAL LOW (ref 6.5–8.1)

## 2022-05-05 LAB — C-REACTIVE PROTEIN: CRP: <0.5 mg/dL (ref <1.0)

## 2022-05-05 LAB — PHOSPHORUS: Phosphorus: 3.7 mg/dL (ref 2.5–4.6)

## 2022-05-05 LAB — CBC
HCT: 33.9 % — ABNORMAL LOW (ref 36.0–46.0)
Hemoglobin: 11.5 g/dL — ABNORMAL LOW (ref 12.0–15.0)
MCH: 28.5 pg (ref 26.0–34.0)
MCHC: 33.9 g/dL (ref 30.0–36.0)
MCV: 84.1 fL (ref 80.0–100.0)
Platelets: 242 10*3/uL (ref 150–400)
RBC: 4.03 MIL/uL (ref 3.87–5.11)
RDW: 12.5 % (ref 11.5–15.5)
WBC: 13.7 10*3/uL — ABNORMAL HIGH (ref 4.0–10.5)
nRBC: 0 % (ref 0.0–0.2)

## 2022-05-05 LAB — MAGNESIUM: Magnesium: 1.9 mg/dL (ref 1.7–2.4)

## 2022-05-05 MED ORDER — ACETAMINOPHEN 10 MG/ML IV SOLN
1000.0000 mg | Freq: Once | INTRAVENOUS | Status: AC
  Administered 2022-05-05: 1000 mg via INTRAVENOUS
  Filled 2022-05-05: qty 100

## 2022-05-05 MED ORDER — DEXTROSE 50 % IV SOLN
12.5000 g | INTRAVENOUS | Status: AC
  Administered 2022-05-05: 12.5 g via INTRAVENOUS
  Filled 2022-05-05: qty 50

## 2022-05-05 MED ORDER — DIPHENHYDRAMINE HCL 50 MG/ML IJ SOLN
12.5000 mg | Freq: Four times a day (QID) | INTRAMUSCULAR | Status: DC | PRN
  Administered 2022-05-06: 12.5 mg via INTRAVENOUS
  Filled 2022-05-05: qty 1

## 2022-05-05 MED ORDER — POTASSIUM CHLORIDE 10 MEQ/100ML IV SOLN
10.0000 meq | Freq: Once | INTRAVENOUS | Status: AC
  Administered 2022-05-05: 10 meq via INTRAVENOUS
  Filled 2022-05-05: qty 100

## 2022-05-05 MED ORDER — DIPHENHYDRAMINE HCL 50 MG/ML IJ SOLN
25.0000 mg | Freq: Once | INTRAMUSCULAR | Status: AC
  Administered 2022-05-05: 25 mg via INTRAVENOUS
  Filled 2022-05-05: qty 1

## 2022-05-05 MED ORDER — DROPERIDOL 2.5 MG/ML IJ SOLN: 2.5000 mg | Freq: Four times a day (QID) | INTRAMUSCULAR | Status: DC | PRN

## 2022-05-05 MED ORDER — DIPHENHYDRAMINE HCL 50 MG/ML IJ SOLN
12.5000 mg | Freq: Once | INTRAMUSCULAR | Status: AC
  Administered 2022-05-05: 12.5 mg via INTRAVENOUS
  Filled 2022-05-05: qty 1

## 2022-05-05 MED ORDER — DEXTROSE-NACL 5-0.9 % IV SOLN: INTRAVENOUS | Status: DC

## 2022-05-05 MED ORDER — POTASSIUM CHLORIDE 10 MEQ/100ML IV SOLN
10.0000 meq | INTRAVENOUS | Status: AC
  Administered 2022-05-05 (×2): 10 meq via INTRAVENOUS
  Filled 2022-05-05 (×2): qty 100

## 2022-05-05 NOTE — Progress Notes (Signed)
  Progress Note   Patient: Ann Moyer XBJ:[id number] DOB: 15-Oct-1990 DOA: 05/04/2022     0 DOS: the patient was seen and examined on 05/05/2022   Brief hospital course:  Assessment and Plan: * Intractable nausea and vomiting - IV NS 100 cc/hr  - IV Droperidol 2.5 mg q6 hr PRN  - IV phenergan 12.5 mg q6 hr PRN  - IV tyenol ordered as needed (pt advised on admission by admitting physician that narcotic's would be avoided on this admission). Pt offered her suboxone, but pt replied with "I need IV med's" - NPO (attempted solid food on 05/05/2022 and pt vomited) - CT abd/pelvis 05/04/2022 showed no acute abd pathology to explain her abd pain (medullary sponge kidney was suspected, 1-2 mm stone in the mid portion of the R kidney but no evidence of passing a stone)   Hypertensive urgency - Will be placed on as needed IV labetalol - Nifedipine 30 mg PO daily    Uncontrolled type 1 diabetes mellitus with hyperglycemia (HCC) - Novolog SS q4 hr - Glargine 16 units sq daily    History of peptic ulcer disease - IV protonix 40 mg q12    Asthma - Duoneb q4 hr PRN    DVT prophylaxis: Lovenox 40 mg sq daily  GI prophylaxis: IV protonix 40 mg q12       Subjective: Pt seen and examined at the bedside. Attempted solid food today but she vomited. She is back on NPO. She seems to respond well to IV tylenol. Continue IV fluids and monitoring of kidney function.   Physical Exam: Vitals:   05/04/22 1958 05/05/22 0336 05/05/22 0455 05/05/22 0908  BP: 126/88 (!) 181/108 (!) 165/99 119/83  Pulse: (!) 110 98 94 (!) 102  Resp:  18  18  Temp: 98.2 F (36.8 C) 99.3 F (37.4 C)  98.5 F (36.9 C)  TempSrc: Oral Oral    SpO2: 99% 100%  100%  Weight:      Height:       Constitutional:      Appearance: She is non-toxic appearing today  HENT:     Head: Normocephalic.  Cardiovascular:     Rate and Rhythm: Intermittent tachycardia   Pulmonary:     Effort: Pulmonary effort is normal.     Breath  sounds: Normal breath sounds.  Abdominal:     General: Abdomen is flat.  Skin:    General: Skin is warm.  Neurological:     Mental Status: She is alert and oriented to person, place, and time.  Psychiatric:        Mood and Affect: Mood normal.   Data Reviewed:   Disposition: Status is: Inpatient  Planned Discharge Destination: Home    Time spent: 35 minutes  Author: Lucienne Minks , MD 05/05/2022 12:32 PM  For on call review [email or web address].

## 2022-05-06 DIAGNOSIS — R112 Nausea with vomiting, unspecified: Secondary | ICD-10-CM | POA: Diagnosis not present

## 2022-05-06 DIAGNOSIS — E1065 Type 1 diabetes mellitus with hyperglycemia: Secondary | ICD-10-CM | POA: Diagnosis not present

## 2022-05-06 DIAGNOSIS — F1129 Opioid dependence with unspecified opioid-induced disorder: Secondary | ICD-10-CM

## 2022-05-06 DIAGNOSIS — I160 Hypertensive urgency: Secondary | ICD-10-CM | POA: Diagnosis not present

## 2022-05-06 LAB — PHOSPHORUS: Phosphorus: 3.9 mg/dL (ref 2.5–4.6)

## 2022-05-06 LAB — COMPREHENSIVE METABOLIC PANEL
ALT: 16 U/L (ref 0–44)
AST: 20 U/L (ref 15–41)
Albumin: 2.6 g/dL — ABNORMAL LOW (ref 3.5–5.0)
Alkaline Phosphatase: 108 U/L (ref 38–126)
Anion gap: 5 (ref 5–15)
BUN: 17 mg/dL (ref 6–20)
CO2: 24 mmol/L (ref 22–32)
Calcium: 8.1 mg/dL — ABNORMAL LOW (ref 8.9–10.3)
Chloride: 109 mmol/L (ref 98–111)
Creatinine, Ser: 1.68 mg/dL — ABNORMAL HIGH (ref 0.44–1.00)
GFR, Estimated: 41 mL/min — ABNORMAL LOW (ref >60)
Glucose, Bld: 86 mg/dL (ref 70–99)
Potassium: 3.5 mmol/L (ref 3.5–5.1)
Sodium: 138 mmol/L (ref 135–145)
Total Bilirubin: 0.5 mg/dL (ref 0.3–1.2)
Total Protein: 5.1 g/dL — ABNORMAL LOW (ref 6.5–8.1)

## 2022-05-06 LAB — CBC
HCT: 29.7 % — ABNORMAL LOW (ref 36.0–46.0)
Hemoglobin: 9.8 g/dL — ABNORMAL LOW (ref 12.0–15.0)
MCH: 28.4 pg (ref 26.0–34.0)
MCHC: 33 g/dL (ref 30.0–36.0)
MCV: 86.1 fL (ref 80.0–100.0)
Platelets: 198 10*3/uL (ref 150–400)
RBC: 3.45 MIL/uL — ABNORMAL LOW (ref 3.87–5.11)
RDW: 12.7 % (ref 11.5–15.5)
WBC: 10.5 10*3/uL (ref 4.0–10.5)
nRBC: 0 % (ref 0.0–0.2)

## 2022-05-06 LAB — GLUCOSE, CAPILLARY
Glucose-Capillary: 81 mg/dL (ref 70–99)
Glucose-Capillary: 123 mg/dL — ABNORMAL HIGH (ref 70–99)
Glucose-Capillary: 84 mg/dL (ref 70–99)

## 2022-05-06 LAB — C-REACTIVE PROTEIN: CRP: <0.5 mg/dL (ref <1.0)

## 2022-05-06 LAB — MAGNESIUM: Magnesium: 1.6 mg/dL — ABNORMAL LOW (ref 1.7–2.4)

## 2022-05-06 MED ORDER — MAGNESIUM OXIDE -MG SUPPLEMENT 400 (240 MG) MG PO TABS
400.0000 mg | ORAL_TABLET | Freq: Once | ORAL | Status: AC
  Administered 2022-05-06: 400 mg via ORAL
  Filled 2022-05-06: qty 1

## 2022-05-06 MED ORDER — HYDROXYZINE HCL 25 MG PO TABS: 25.0000 mg | ORAL_TABLET | Freq: Three times a day (TID) | ORAL | 0 refills | Status: AC | PRN

## 2022-05-06 MED ORDER — HYDROXYZINE HCL 25 MG PO TABS
25.0000 mg | ORAL_TABLET | Freq: Once | ORAL | Status: AC
  Administered 2022-05-06: 25 mg via ORAL
  Filled 2022-05-06: qty 1

## 2022-05-06 MED ORDER — HYDROXYZINE HCL 25 MG PO TABS
25.0000 mg | ORAL_TABLET | Freq: Three times a day (TID) | ORAL | Status: DC | PRN
  Administered 2022-05-06: 25 mg via ORAL
  Filled 2022-05-06: qty 1

## 2022-05-06 MED ORDER — BUPRENORPHINE HCL-NALOXONE HCL 8-2 MG SL SUBL
1.0000 | SUBLINGUAL_TABLET | Freq: Once | SUBLINGUAL | Status: AC
  Administered 2022-05-06: 1 via SUBLINGUAL
  Filled 2022-05-06: qty 1

## 2022-05-06 MED ORDER — BUPRENORPHINE HCL-NALOXONE HCL 8-2 MG SL SUBL: 1.0000 | SUBLINGUAL_TABLET | Freq: Every day | SUBLINGUAL | Status: DC

## 2022-05-06 NOTE — TOC Transition Note (Signed)
Transition of Care [facility]) - CM/SW Discharge Note   Patient Details  Name: Ann Moyer MRN: [id number] Date of Birth: 18-May-1990  Transition of Care [facility]) CM/SW Contact:  Candie Chroman, LCSW Phone Number: 05/06/2022, 4:13 PM   Clinical Narrative:  Patient has orders to discharge home today. No further concerns. CSW signing off.   Final next level of care: Home/Self Care Barriers to Discharge: Barriers Resolved   Patient Goals and CMS Choice   Choice offered to / list presented to : NA  Discharge Placement                  Patient to be transferred to facility by: One of her friends   Patient and family notified of of transfer: 05/06/22  Discharge Plan and Services Additional resources added to the After Visit Summary for       Post Acute Care Choice: NA                               Social Determinants of Health (SDOH) Interventions SDOH Screenings   Food Insecurity: No Food Insecurity (05/04/2022)  Recent Concern: [place] Present (03/11/2022)  Housing: Low Risk  (05/04/2022)  Transportation Needs: No Transportation Needs (05/04/2022)  Utilities: Not At Risk (05/04/2022)  Tobacco Use: High Risk (05/04/2022)     Readmission Risk Interventions    05/06/2022   10:09 AM  Readmission Risk Prevention Plan  Transportation Screening Complete  PCP or Specialist Appt within 3-5 Days Complete  Social Work Consult for [place] Planning/Counseling [place] Not Applicable  Medication Review Press photographer) Complete

## 2022-05-06 NOTE — TOC Initial Note (Signed)
Transition of Care [facility]) - Initial/Assessment Note    Patient Details  Name: Ann Moyer MRN: [id number] Date of Birth: 11-02-1990  Transition of Care [facility]) CM/SW Contact:    Candie Chroman, LCSW Phone Number: 05/06/2022, 10:10 AM  Clinical Narrative:   Readmission prevention screen complete. CSW met with patient. No supports at bedside. CSW introduced role and explained that discharge planning would be discussed. PCP is Erline Levine, MD at [facility]. Patient drives herself to appointments. She uses either Paediatric nurse on [employer] or the [employer]. No issues obtaining medications. Patient lives home with her friend. No home health or DME prior to admission. No further concerns. CSW encouraged patient to contact CSW as needed. CSW will continue to follow patient for support and facilitate return home once stable. One of her friends will transport her home at discharge.               Expected Discharge Plan: Home/Self Care Barriers to Discharge: Continued Medical Work up   Patient Goals and CMS Choice     Choice offered to / list presented to : NA      Expected Discharge Plan and Services     Post Acute Care Choice: NA Living arrangements for the past 2 months: Single Family Home                                      Prior Living Arrangements/Services Living arrangements for the past 2 months: Single Family Home Lives with:: Friends Patient language and need for interpreter reviewed:: Yes Do you feel safe going back to the place where you live?: Yes            Criminal Activity/Legal Involvement Pertinent to Current Situation/Hospitalization: No - Comment as needed  Activities of Daily Living Home Assistive Devices/Equipment: None ADL Screening (condition at time of admission) Patient's cognitive ability adequate to safely complete daily activities?: Yes Is the patient deaf or have difficulty hearing?: No Does the patient have difficulty  seeing, even when wearing glasses/contacts?: No Does the patient have difficulty concentrating, remembering, or making decisions?: No Patient able to express need for assistance with ADLs?: Yes Does the patient have difficulty dressing or bathing?: No Independently performs ADLs?: Yes (appropriate for developmental age) Does the patient have difficulty walking or climbing stairs?: No Weakness of Legs: None Weakness of Arms/Hands: None  Permission Sought/Granted                  Emotional Assessment Appearance:: Appears stated age Attitude/Demeanor/Rapport: Engaged, Gracious Affect (typically observed): Accepting, Appropriate, Calm, Pleasant Orientation: : Oriented to Self, Oriented to Place, Oriented to  Time, Oriented to Situation Alcohol / Substance Use: Not Applicable Psych Involvement: No (comment)  Admission diagnosis:  Generalized abdominal pain [R10.84] Hypertensive urgency [I16.0] Opioid dependence with opioid-induced disorder (HCC) [F11.29] Acute DM type 1 causing complication (HCC) [Y09.9] Intractable nausea and vomiting [R11.2] Chronic kidney disease, unspecified CKD stage [N18.9] Patient Active Problem List   Diagnosis Date Noted   Uncontrolled type 1 diabetes mellitus with hyperglycemia ([place]) 05/04/2022   History of peptic ulcer disease 05/04/2022   Hypertensive urgency 03/11/2022   Elevated alpha fetoprotein 03/11/2022   Acute respiratory failure with hypoxia ([place]) 12/18/2020   Hyperglycemia due to diabetes mellitus ([place]) 12/18/2020   Leukocytosis 12/18/2020   Hyponatremia 12/18/2020   Marijuana use 12/18/2020   Amenorrhea 12/18/2020   Chronic back pain 12/18/2020  Epigastric pain    Non-intractable vomiting    Intractable abdominal pain 11/18/2020   Intractable nausea and vomiting 93/26/7124   Cyclic vomiting syndrome 58/12/9831   Uncomplicated opioid dependence ([place])    Depression with anxiety    Intractable vomiting    Esophagitis  07/14/2020    Acute kidney injury superimposed on CKD ([place]) 07/13/2020   Borderline personality disorder ([place]) 07/12/2020   Type 1 diabetes mellitus not at goal [facility]) 07/12/2020   Hypertension 09/12/2018   ADHD 03/11/2018   Chronic kidney disease 03/01/2018   Cannabis abuse 04/05/2017   Complete septate uterus 10/28/2016   Hematuria, microscopic 10/28/2016   Heart murmur 08/05/2016   Opioid use with opioid-induced disorder ([place]) 08/24/2013   Idiopathic scoliosis and kyphoscoliosis 09/14/2012   Closed rib fracture 08/16/2012   Diabetic neuropathy ([place]) 06/02/2012   Insomnia 02/03/2012   Diabetic gastroparesis ([place]) 09/10/2011   Intussusception intestine ([place]) 09/04/2011   Mild intermittent asthma, uncomplicated 82/50/5397   Asthma 04/15/2007   PCP:  [facility], [place]:   [facility] [address], [place] - [address] [place] [address] Phone: [phone] Fax: [phone]     Social Determinants of Health (SDOH) Social History: SDOH Screenings   Food Insecurity: No Food Insecurity (05/04/2022)  Recent Concern: Food Insecurity - Food Insecurity Present (03/11/2022)  Housing: Low Risk  (05/04/2022)  Transportation Needs: No Transportation Needs (05/04/2022)  Utilities: Not At Risk (05/04/2022)  Tobacco Use: High Risk (05/04/2022)   SDOH Interventions:     Readmission Risk Interventions    05/06/2022   10:09 AM  Readmission Risk Prevention Plan  Transportation Screening Complete  PCP or Specialist Appt within 3-5 Days Complete  Social Work Consult for [place] Planning/Counseling Complete  Palliative Care Screening Not Applicable  Medication Review [employer]) Complete

## 2022-05-06 NOTE — Discharge Summary (Signed)
Physician Discharge Summary   Patient: Ann Moyer MRN: [id number] DOB: 19-Aug-1990  Admit date:     05/04/2022  Discharge date: 05/06/22  Discharge Physician: Annita Brod   [facility]   Recommendations at discharge:   New medication: Atarax 25 mg 3 times a day as needed for anxiety  Discharge Diagnoses: Principal Problem:   Intractable nausea and vomiting Active Problems:   Hypertensive urgency   Uncontrolled type 1 diabetes mellitus with hyperglycemia ([place])   Asthma   History of peptic ulcer disease  Resolved Problems:   * No resolved hospital problems. *  Hospital Course: 32 year old female with past medical history of diabetes mellitus type 1 uncontrolled, borderline personality disorder, stage IIIb chronic kidney disease and polysubstance abuse including opioid use (now sober on Suboxone) and cannabinoid use who presented to the emergency room on 1/29 with complaints of nausea and vomiting that started earlier that day.  In the emergency room, patient noted to have blood sugar of 327 and markedly elevated blood pressure with systolic ranging in the 245Y to 220s.  Assessment and Plan: Intractable nausea and vomiting: Resolved Possibly from cannabinoid hyperemesis syndrome versus gastroenteritis.  Patient did report some episodes of diarrhea.  Diabetic gastroparesis previously worked up and was negative.  Patient placed on IV Zofran and Phenergan.  Avoiding narcotics and opioids given history of abuse.  Symptoms improved and by 1/31, able to tolerate solid food.  Peptic ulcer disease: Stable, on PPI  Uncontrolled diabetes mellitus type 1 with hyperglycemia Basal insulin and sliding scale  Opiate abuse: Continued on Suboxone.  History of asthma Stable, as needed nebulizers during hospitalization      Pain control - [place] Controlled Substance Reporting System database was reviewed. and patient was instructed, not to drive, operate heavy  machinery, perform activities at heights, swimming or participation in water activities or provide baby-sitting services while on Pain, Sleep and Anxiety Medications; until their outpatient Physician has advised to do so again. Also recommended to not to take more than prescribed Pain, Sleep and Anxiety Medications.  Consultants: None Procedures performed: None Disposition: Home Diet recommendation:  Discharge Diet Orders (From admission, onward)     Start     Ordered   05/06/22 0000  Diet - low sodium heart healthy        05/06/22 1607           Carb modified diet DISCHARGE MEDICATION: Allergies as of 05/06/2022       Reactions   Amitriptyline Swelling, Other (See Comments)   Legs and feet   Gabapentin Swelling   swelling   Ivp Dye [iodinated Contrast Media]    Kidney damage   Pregabalin Swelling   swelling   Tramadol Itching, Swelling   Swelling, itching Eye and face swelling   Haloperidol Other (See Comments)   Uncontrollable movements  "uncontrolled movement of my body, like tremors"; previously charted to cause nausea and vomiting.     Toradol [ketorolac Tromethamine] Nausea Only   Ketorolac Itching   Metoclopramide Itching   Jittery and itchy Jittery and itchy        Medication List     TAKE these medications    albuterol 108 (90 Base) MCG/ACT inhaler Commonly known as: VENTOLIN HFA Inhale 2 puffs into the lungs every 4 (four) hours as needed for wheezing or shortness of breath.   amphetamine-dextroamphetamine 20 MG tablet Commonly known as: ADDERALL Take 20 mg by mouth daily.   buprenorphine-naloxone 8-2 mg Subl SL tablet  Commonly known as: SUBOXONE Place 0.5 tablets under the tongue in the morning, at noon, in the evening, and at bedtime.   clindamycin 1 % gel Commonly known as: CLINDAGEL Apply 1 Application topically 2 (two) times daily.   hydrOXYzine 25 MG tablet Commonly known as: ATARAX Take 1 tablet (25 mg total) by mouth 3 (three) times  daily as needed for anxiety.   insulin aspart 100 UNIT/ML FlexPen Commonly known as: NOVOLOG Inject 2-5 Units into the skin 2 (two) times daily before a meal. Sliding scale   insulin glargine 100 UNIT/ML Solostar Pen Commonly known as: LANTUS Inject 16 Units into the skin at bedtime. What changed: how much to take   ondansetron 4 MG tablet Commonly known as: ZOFRAN Take 1 tablet (4 mg total) by mouth every 6 (six) hours as needed for nausea.   promethazine 12.5 MG suppository Commonly known as: PHENERGAN Place 1 suppository (12.5 mg total) rectally every 6 (six) hours as needed for up to 24 doses for nausea or vomiting.   Symbicort 80-4.5 MCG/ACT inhaler Generic drug: budesonide-formoterol Inhale 2 puffs into the lungs daily.        Discharge Exam: Filed Weights   05/04/22 0102  Weight: 52.2 kg   General: Alert and oriented x 3, no acute distress Cardiovascular: Regular rate and rhythm, S1-S2 Lungs: Clear to auscultation bilaterally  Condition at discharge: good  The results of significant diagnostics from this hospitalization (including imaging, microbiology, ancillary and laboratory) are listed below for reference.   Imaging Studies: CT ABDOMEN PELVIS WO CONTRAST  Result Date: 05/04/2022 CLINICAL DATA:  Abdominal pain, acute, nonlocalized. Symptoms began yesterday. EXAM: CT ABDOMEN AND PELVIS WITHOUT CONTRAST TECHNIQUE: Multidetector CT imaging of the abdomen and pelvis was performed following the standard protocol without IV contrast. RADIATION DOSE REDUCTION: This exam was performed according to the departmental dose-optimization program which includes automated exposure control, adjustment of the mA and/or kV according to patient size and/or use of iterative reconstruction technique. COMPARISON:  11/18/2020.  07/12/2020. FINDINGS: Lower chest: Normal Hepatobiliary: Liver parenchyma is normal without contrast. No calcified gallstones. Pancreas: Normal Spleen: Normal  Adrenals/Urinary Tract: Adrenal glands are normal. Hyperdensity of the renal papillary regions suggesting medullary sponge kidney. The only measurable stone is a 1-2 mm stone in the midportion of the right kidney. Small areas of renal cortical volume loss on the right, possibly due to prior obstruction or infection. No evidence of passing stone presently. No stone in the bladder. Stomach/Bowel: Stomach and small intestine are normal. Retrocecal appendix appears normal. No abnormal colon finding. No sign of colitis on this study done without oral or intravenous contrast. Vascular/Lymphatic: Normal Reproductive: Normal.  No pelvic mass. Other: No free fluid or air. Musculoskeletal: No abnormal finding. IMPRESSION: 1. Hyperdensity of the renal papillary regions suggesting medullary sponge kidney. The only measurable stone is a 1-2 mm stone in the midportion of the right kidney. No evidence of passing stone presently. 2. Small areas of renal cortical volume loss on the right, possibly due to prior obstruction or infection. 3. No abnormal bowel finding discernible on this study done without oral or intravenous contrast. Low level colitis would be difficult to establish given these parameters. Electronically Signed   By: Paulina Fusi M.D.   On: 05/04/2022 12:57    Microbiology: Results for orders placed or performed during the hospital encounter of 12/18/20  Resp Panel by RT-PCR (Flu A&B, Covid) Nasopharyngeal Swab     Status: None   Collection Time: 12/18/20  2:03 AM   Specimen: Nasopharyngeal Swab; Nasopharyngeal(NP) swabs in vial transport medium  Result Value Ref Range Status   SARS Coronavirus 2 by RT PCR NEGATIVE NEGATIVE Final    Comment: (NOTE) SARS-CoV-2 target nucleic acids are NOT DETECTED.  The SARS-CoV-2 RNA is generally detectable in upper respiratory specimens during the acute phase of infection. The lowest concentration of SARS-CoV-2 viral copies this assay can detect is 138 copies/mL. A  negative result does not preclude SARS-Cov-2 infection and should not be used as the sole basis for treatment or other patient management decisions. A negative result may occur with  improper specimen collection/handling, submission of specimen other than nasopharyngeal swab, presence of viral mutation(s) within the areas targeted by this assay, and inadequate number of viral copies(<138 copies/mL). A negative result must be combined with clinical observations, patient history, and epidemiological information. The expected result is Negative.  Fact Sheet for Patients:  EntrepreneurPulse.com.au  Fact Sheet for Healthcare Providers:  IncredibleEmployment.be  This test is no t yet approved or cleared by the [place] FDA and  has been authorized for detection and/or diagnosis of SARS-CoV-2 by FDA under an Emergency Use Authorization (EUA). This EUA will remain  in effect (meaning this test can be used) for the duration of the COVID-19 declaration under Section 564(b)(1) of the Act, 21 U.S.C.section 360bbb-3(b)(1), unless the authorization is terminated  or revoked sooner.       Influenza A by PCR NEGATIVE NEGATIVE Final   Influenza B by PCR NEGATIVE NEGATIVE Final    Comment: (NOTE) The Xpert Xpress SARS-CoV-2/FLU/RSV plus assay is intended as an aid in the diagnosis of influenza from Nasopharyngeal swab specimens and should not be used as a sole basis for treatment. Nasal washings and aspirates are unacceptable for Xpert Xpress SARS-CoV-2/FLU/RSV testing.  Fact Sheet for Patients: EntrepreneurPulse.com.au  Fact Sheet for Healthcare Providers: IncredibleEmployment.be  This test is not yet approved or cleared by the [place] FDA and has been authorized for detection and/or diagnosis of SARS-CoV-2 by FDA under an Emergency Use Authorization (EUA). This EUA will remain in effect (meaning this test can  be used) for the duration of the COVID-19 declaration under Section 564(b)(1) of the Act, 21 U.S.C. section 360bbb-3(b)(1), unless the authorization is terminated or revoked.  Performed at [facility], [address], [place], [place] [address]     Labs: CBC: Recent Labs  Lab 05/04/22 0105 05/04/22 0410 05/05/22 0518 05/06/22 0530  WBC 9.2 8.7 13.7* 10.5  HGB 13.4 11.6* 11.5* 9.8*  HCT 39.6 34.4* 33.9* 29.7*  MCV 84.1 85.1 84.1 86.1  PLT 275 216 242 409   Basic Metabolic Panel: Recent Labs  Lab 05/04/22 0105 05/04/22 0410 05/05/22 0518 05/06/22 0530  NA 136 135 137 138  K 4.8 4.3 3.3* 3.5  CL 102 101 106 109  CO2 23 22 24 24   GLUCOSE 327* 387* 175* 86  BUN 25* 25* 26* 17  CREATININE 1.80* 1.81* 2.08* 1.68*  CALCIUM 9.4 8.9 8.5* 8.1*  MG  --   --  1.9 1.6*  PHOS  --   --  3.7 3.9   Liver Function Tests: Recent Labs  Lab 05/04/22 0105 05/05/22 0518 05/06/22 0530  AST 29 21 20   ALT 28 21 16   ALKPHOS 188* 146* 108  BILITOT 0.6 0.6 0.5  PROT 7.1 6.3* 5.1*  ALBUMIN 3.3* 3.1* 2.6*   CBG: Recent Labs  Lab 05/05/22 2052 05/05/22 2341 05/06/22 0442 05/06/22 0929 05/06/22 1154  GLUCAP 199*  164* 81 123* 84    Discharge time spent: less than 30 minutes.  Signed: Annita Brod, MD [facility] 05/06/2022
